# Patient Record
Sex: Female | Born: 1975 | Race: White | Hispanic: No | Smoking: Former smoker
Health system: Southern US, Community
[De-identification: ages and names within clinical notes are randomized; demographics above are authoritative.]

## PROBLEM LIST (undated history)

## (undated) DIAGNOSIS — R112 Nausea with vomiting, unspecified: Secondary | ICD-10-CM

## (undated) DIAGNOSIS — J390 Retropharyngeal and parapharyngeal abscess: Secondary | ICD-10-CM

## (undated) DIAGNOSIS — Z9889 Other specified postprocedural states: Secondary | ICD-10-CM

## (undated) DIAGNOSIS — F32A Depression, unspecified: Secondary | ICD-10-CM

## (undated) DIAGNOSIS — C801 Malignant (primary) neoplasm, unspecified: Secondary | ICD-10-CM

## (undated) DIAGNOSIS — F411 Generalized anxiety disorder: Secondary | ICD-10-CM

## (undated) DIAGNOSIS — E039 Hypothyroidism, unspecified: Secondary | ICD-10-CM

## (undated) HISTORY — PX: WISDOM TOOTH EXTRACTION: SHX21

## (undated) HISTORY — DX: Generalized anxiety disorder: F41.1

## (undated) HISTORY — PX: OTHER SURGICAL HISTORY: SHX169

## (undated) HISTORY — DX: Retropharyngeal and parapharyngeal abscess: J39.0

---
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
## 1997-08-20 HISTORY — PX: TONSILLECTOMY: SUR1361

## 1998-12-30 ENCOUNTER — Emergency Department (HOSPITAL_COMMUNITY): Admission: EM | Admit: 1998-12-30 | Discharge: 1998-12-30 | Payer: Self-pay | Admitting: Emergency Medicine

## 1999-02-07 ENCOUNTER — Inpatient Hospital Stay (HOSPITAL_COMMUNITY): Admission: AD | Admit: 1999-02-07 | Discharge: 1999-02-07 | Payer: Self-pay | Admitting: *Deleted

## 2005-06-12 ENCOUNTER — Other Ambulatory Visit: Admission: RE | Admit: 2005-06-12 | Discharge: 2005-06-12 | Payer: Self-pay | Admitting: Obstetrics and Gynecology

## 2009-07-21 ENCOUNTER — Encounter: Payer: Self-pay | Admitting: Family Medicine

## 2009-07-21 ENCOUNTER — Ambulatory Visit: Payer: Self-pay | Admitting: Family Medicine

## 2009-07-21 DIAGNOSIS — E0590 Thyrotoxicosis, unspecified without thyrotoxic crisis or storm: Secondary | ICD-10-CM | POA: Insufficient documentation

## 2009-07-22 LAB — CONVERTED CEMR LAB
Free Thyroxine Index: 3.7 (ref 1.0–3.9)
T3 Uptake Ratio: 30 % (ref 22.5–37.0)
T4, Total: 12.4 ug/dL (ref 5.0–12.5)
Thyroglobulin Ab: 316.3 — ABNORMAL HIGH (ref 0.0–60.0)

## 2009-08-01 ENCOUNTER — Encounter (HOSPITAL_COMMUNITY): Admission: RE | Admit: 2009-08-01 | Discharge: 2009-08-19 | Payer: Self-pay | Admitting: Family Medicine

## 2009-08-08 DIAGNOSIS — E0500 Thyrotoxicosis with diffuse goiter without thyrotoxic crisis or storm: Secondary | ICD-10-CM | POA: Insufficient documentation

## 2009-09-19 ENCOUNTER — Encounter: Payer: Self-pay | Admitting: Family Medicine

## 2009-10-18 ENCOUNTER — Ambulatory Visit: Payer: Self-pay | Admitting: Family Medicine

## 2009-10-18 ENCOUNTER — Encounter: Payer: Self-pay | Admitting: Family Medicine

## 2009-10-18 DIAGNOSIS — T887XXA Unspecified adverse effect of drug or medicament, initial encounter: Secondary | ICD-10-CM | POA: Insufficient documentation

## 2009-10-19 LAB — CONVERTED CEMR LAB
ALT: 13 units/L (ref 0–35)
Alkaline Phosphatase: 48 units/L (ref 39–117)
BUN: 15 mg/dL (ref 6–23)
Basophils Relative: 0 % (ref 0–1)
CO2: 27 meq/L (ref 19–32)
Calcium: 9.3 mg/dL (ref 8.4–10.5)
Chloride: 106 meq/L (ref 96–112)
Eosinophils Relative: 2 % (ref 0–5)
Glucose, Bld: 143 mg/dL — ABNORMAL HIGH (ref 70–99)
Monocytes Absolute: 0.5 10*3/uL (ref 0.1–1.0)
Monocytes Relative: 4 % (ref 3–12)
Neutrophils Relative %: 70 % (ref 43–77)
Platelets: 296 10*3/uL (ref 150–400)
RDW: 12.8 % (ref 11.5–15.5)
Total Bilirubin: 0.3 mg/dL (ref 0.3–1.2)

## 2009-10-28 ENCOUNTER — Encounter (HOSPITAL_COMMUNITY): Admission: RE | Admit: 2009-10-28 | Discharge: 2009-12-20 | Payer: Self-pay | Admitting: Internal Medicine

## 2010-06-16 ENCOUNTER — Encounter: Payer: Self-pay | Admitting: Family Medicine

## 2010-06-16 ENCOUNTER — Ambulatory Visit: Payer: Self-pay | Admitting: Family Medicine

## 2010-06-16 DIAGNOSIS — J019 Acute sinusitis, unspecified: Secondary | ICD-10-CM

## 2010-09-10 ENCOUNTER — Encounter: Payer: Self-pay | Admitting: Family Medicine

## 2010-09-19 NOTE — Assessment & Plan Note (Signed)
Summary: Sinusitis   Vital Signs:  Patient profile:   35 year old female Height:      65 inches Weight:      130 pounds Temp:     98.4 degrees F oral Pulse rate:   75 / minute BP sitting:   128 / 81  (right arm) Cuff size:   regular  Vitals Entered By: Avon Gully CMA, Duncan Dull) (June 16, 2010 1:19 PM) CC: chest congestion, productive cough, had a "head cold" for three weeks   Primary Care Provider:  Seymour Bars DO  CC:  chest congestion, productive cough, and had a "head cold" for three weeks.  History of Present Illness: chest congestion, productive cough, had a "head cold" for three weeks. Still has nasal congestion. + post nasa drip. Cough is dry and wet. No fever.  No ear pain or pressure.  No GI sxs. Intermittant HA behind her eye. CHest feels "goopy". No SOB.  Hx of asthma asa child. Using contact day cold med.    Current Medications (verified): 1)  Effexor .... As Directed By Psych 2)  Lorazepam 0.5 Mg Tabs (Lorazepam) .... Take 1-2 Tabs By Mouth As Needed 3)  Seasonique 0.15-0.03 &0.01 Mg Tabs (Levonorgest-Eth Estrad 91-Day) .... Take 1 Tab By Mouth Once Daily 4)  Synthroid 25 Mcg Tabs (Levothyroxine Sodium)  Allergies (verified): 1)  ! Methimazole (Methimazole)  Comments:  Nurse/Medical Assistant: The patient's medications and allergies were reviewed with the patient and were updated in the Medication and Allergy Lists. Avon Gully CMA, Duncan Dull) (June 16, 2010 1:21 PM)  Physical Exam  General:  Well-developed,well-nourished,in no acute distress; alert,appropriate and cooperative throughout examination Head:  Normocephalic and atraumatic without obvious abnormalities. No apparent alopecia or balding. Eyes:  No corneal or conjunctival inflammation noted. EOMI. Perrla.  Ears:  External ear exam shows no significant lesions or deformities.  Otoscopic examination reveals clear canals, tympanic membranes are intact bilaterally without bulging, retraction,  inflammation or discharge. Hearing is grossly normal bilaterally. Nose:  External nasal examination shows no deformity or inflammation. Nasal mucosa are pink and moist without lesions or exudates. Mouth:  Oral mucosa and oropharynx without lesions or exudates.  Teeth in good repair. Neck:  No deformities, masses, or tenderness noted. Lungs:  Normal respiratory effort, chest expands symmetrically. Lungs are clear to auscultation, no crackles or wheezes. Heart:  Normal rate and regular rhythm. S1 and S2 normal without gallop, murmur, click, rub or other extra sounds. Skin:  no rashes.   Cervical Nodes:  No lymphadenopathy noted Psych:  Cognition and judgment appear intact. Alert and cooperative with normal attention span and concentration. No apparent delusions, illusions, hallucinations   Impression & Recommendations:  Problem # 1:  SINUSITIS - ACUTE-NOS (ICD-461.9)  Her updated medication list for this problem includes:    Amoxicillin 875 Mg Tabs (Amoxicillin) .Marland Kitchen... Take 1 tablet by mouth two times a day for 10 days  Instructed on treatment. Call if symptoms persist or worsen.   Complete Medication List: 1)  Effexor  .... As directed by psych 2)  Lorazepam 0.5 Mg Tabs (Lorazepam) .... Take 1-2 tabs by mouth as needed 3)  Seasonique 0.15-0.03 &0.01 Mg Tabs (Levonorgest-eth estrad 91-day) .... Take 1 tab by mouth once daily 4)  Synthroid 25 Mcg Tabs (Levothyroxine sodium) 5)  Amoxicillin 875 Mg Tabs (Amoxicillin) .... Take 1 tablet by mouth two times a day for 10 days  Patient Instructions: 1)  Call if not better in one week.  Prescriptions: AMOXICILLIN 875  MG TABS (AMOXICILLIN) Take 1 tablet by mouth two times a day for 10 days  #20 x 0   Entered and Authorized by:   Nani Gasser MD   Signed by:   Nani Gasser MD on 06/16/2010   Method used:   Electronically to        [facility] [phone] (retail)       [address]       [place], [place]  [address]        Ph: [phone] or [phone]       Fax: [phone]   RxID:   [id number]    Orders Added: 1)  Est. Patient Level III [34742]

## 2010-09-19 NOTE — Consult Note (Signed)
Summary: [facility]   Imported By: Lanelle Bal 09/26/2009 11:29:09  _____________________________________________________________________  External Attachment:    Type:   Image     Comment:   External Document

## 2010-09-19 NOTE — Assessment & Plan Note (Signed)
Summary: drug rxn   Vital Signs:  Patient profile:   35 year old female Height:      65 inches Weight:      122 pounds BMI:     20.38 O2 Sat:      98 % on Room air Temp:     97.8 degrees F oral Pulse rate:   91 / minute BP sitting:   127 / 75  (left arm) Cuff size:   regular  Vitals Entered By: Payton Spark CMA (October 18, 2009 2:57 PM)  O2 Flow:  Room air CC: Had allergic reaction to Methimazole last week. Was told if itching and rash did not resolve in 7 days to see PCP.    Primary Care Provider:  Seymour Bars DO  CC:  Had allergic reaction to Methimazole last week. Was told if itching and rash did not resolve in 7 days to see PCP. Marland Kitchen  History of Present Illness: 35 yo WF presents for allergic reaction that started from the start of Methimazole (3 wks into new RX)  given by endocrinology.  The rash and itchiness started 10 days ago.  The Benadryl has helped her hives go away.  She is scrathing in her sleep.  Denies any new soaps or detergents.  Denies wheezing or throat edema.  Denies hx of eczema or hay fever.  Dr Sharl Ma has been seeing her for hyperthyroidism.  She is scheduled for radioactive iodine in March.    Current Medications (verified): 1)  Effexor .... As Directed By Psych 2)  Lorazepam 0.5 Mg Tabs (Lorazepam) .... Take 1-2 Tabs By Mouth As Needed 3)  Seasonique 0.15-0.03 &0.01 Mg Tabs (Levonorgest-Eth Estrad 91-Day) .... Take 1 Tab By Mouth Once Daily 4)  Atenolol 25 Mg Tabs (Atenolol) .Marland Kitchen.. 1 Tab By Mouth Daily  Allergies (verified): 1)  ! Methimazole (Methimazole)  Past History:  Past Medical History: Reviewed history from 07/21/2009 and no changes required. hyperthyroidism 06-2009 GAD - DR Aldean Ast - [facility] G4P0 - 4 eABs  Social History: Reviewed history from 07/21/2009 and no changes required. Banker for [employer]. Quit smoking in 2007. 1 ETOH/ wk. Walks 4 days/ wk.  Fair diet.   Married to [employer].  No kids.    Review of Systems      See  HPI  Physical Exam  General:  alert, well-developed, well-nourished, and well-hydrated.   Head:  normocephalic and atraumatic.   Eyes:  conjunctiva clear Nose:  no nasal discharge.   Mouth:  pharynx pink and moist.  no peeling of the mucous membraines.   Neck:  no masses.  no soft tissue edema Lungs:  Normal respiratory effort, chest expands symmetrically. Lungs are clear to auscultation, no crackles or wheezes. Heart:  Normal rate and regular rhythm. S1 and S2 normal without gallop, murmur, click, rub or other extra sounds. Skin:  no hives but has excoriations on both of her arms, legs, neck and trunk Cervical Nodes:  No lymphadenopathy noted   Impression & Recommendations:  Problem # 1:  ADVERSE DRUG REACTION (ICD-995.20) Pruritis and urticaria listed as drug SE from Methimazole which she stopped 10 days ago.  Valda Lamb has improved but she still has pruritis.  Check CMP and CBC today for prolonged symptoms and start on Prednisone 40 mg/ day for 5 days along with Hydroxyzine at bedtime.  Can use Sarna Lotion daily.  Call if not improved in a wk.   Orders: T-CBC w/Diff ([id number]) T-Comprehensive Metabolic Panel [phone])  Problem # 2:  HYPERTHYROIDISM (ICD-242.90) Has f/u with Dr Sharl Ma for radioactive iodine in 2 wks.  RFd her Atenolol today. Her updated medication list for this problem includes:    Atenolol 25 Mg Tabs (Atenolol) .Marland Kitchen... 1 tab by mouth daily  Complete Medication List: 1)  Effexor  .... As directed by psych 2)  Lorazepam 0.5 Mg Tabs (Lorazepam) .... Take 1-2 tabs by mouth as needed 3)  Seasonique 0.15-0.03 &0.01 Mg Tabs (Levonorgest-eth estrad 91-day) .... Take 1 tab by mouth once daily 4)  Atenolol 25 Mg Tabs (Atenolol) .Marland Kitchen.. 1 tab by mouth daily 5)  Prednisone 20 Mg Tabs (Prednisone) .... 2 tabs by mouth qam x 5 days 6)  Hydroxyzine Hcl 25 Mg Tabs (Hydroxyzine hcl) .Marland Kitchen.. 1-2 tabs by mouth at bedtime as needed itching  Patient Instructions: 1)  Labs  today. 2)  Will call you w/ results tomorrow. 3)  Start Hydroxyzine tonight and take at bedtime for itching. 4)  Take Prednisone 40 mg in the AM x 5 days for allergic reaction. 5)  Use Sarna anti itch cream after bathing. Prescriptions: HYDROXYZINE HCL 25 MG TABS (HYDROXYZINE HCL) 1-2 tabs by mouth at bedtime as needed itching  #12 x 0   Entered and Authorized by:   Seymour Bars DO   Signed by:   Seymour Bars DO on 10/18/2009   Method used:   Electronically to        [facility] (retail)       [address]       [place], [place]  [address]       Ph: [phone]       Fax: [phone]   RxID:   [id number] PREDNISONE 20 MG TABS (PREDNISONE) 2 tabs by mouth qAM x 5 days  #10 x 0   Entered and Authorized by:   Seymour Bars DO   Signed by:   Seymour Bars DO on 10/18/2009   Method used:   Electronically to        [facility] [place] (retail)       [address]       [place], [place]  [address]       Ph: [phone]       Fax: [phone]   RxID:   [id number] ATENOLOL 25 MG TABS (ATENOLOL) 1 tab by mouth daily  #30 x 2   Entered and Authorized by:   Seymour Bars DO   Signed by:   Seymour Bars DO on 10/18/2009   Method used:   Electronically to        [facility] (retail)       [address]       [place], [place]  [address]       Ph: [phone]       Fax: [phone]   RxID:   [id number]

## 2010-11-13 LAB — HCG, SERUM, QUALITATIVE: Preg, Serum: NEGATIVE

## 2011-09-21 LAB — STREP B DNA PROBE: GBS: NEGATIVE

## 2011-10-11 ENCOUNTER — Encounter (HOSPITAL_COMMUNITY): Payer: Self-pay | Admitting: Anesthesiology

## 2011-10-11 ENCOUNTER — Encounter (HOSPITAL_COMMUNITY)
Admission: AD | Disposition: A | Discharge status: Home / Self Care | Payer: Self-pay | Source: Ambulatory Visit | Attending: Obstetrics and Gynecology

## 2011-10-11 ENCOUNTER — Inpatient Hospital Stay (HOSPITAL_COMMUNITY)
Admission: AD | Admit: 2011-10-11 | Discharge: 2011-10-15 | DRG: 371 | Disposition: A | Discharge status: Home / Self Care | Payer: BC Managed Care – PPO | Source: Ambulatory Visit | Attending: Obstetrics and Gynecology | Admitting: Obstetrics and Gynecology

## 2011-10-11 ENCOUNTER — Encounter (HOSPITAL_COMMUNITY): Payer: Self-pay | Admitting: *Deleted

## 2011-10-11 ENCOUNTER — Inpatient Hospital Stay (HOSPITAL_COMMUNITY): Payer: BC Managed Care – PPO | Admitting: Anesthesiology

## 2011-10-11 ENCOUNTER — Other Ambulatory Visit: Payer: Self-pay | Admitting: Obstetrics and Gynecology

## 2011-10-11 DIAGNOSIS — T887XXA Unspecified adverse effect of drug or medicament, initial encounter: Secondary | ICD-10-CM

## 2011-10-11 DIAGNOSIS — O4100X0 Oligohydramnios, unspecified trimester, not applicable or unspecified: Secondary | ICD-10-CM | POA: Diagnosis present

## 2011-10-11 DIAGNOSIS — E0590 Thyrotoxicosis, unspecified without thyrotoxic crisis or storm: Secondary | ICD-10-CM

## 2011-10-11 DIAGNOSIS — E0500 Thyrotoxicosis with diffuse goiter without thyrotoxic crisis or storm: Secondary | ICD-10-CM

## 2011-10-11 DIAGNOSIS — E039 Hypothyroidism, unspecified: Secondary | ICD-10-CM | POA: Diagnosis present

## 2011-10-11 DIAGNOSIS — E079 Disorder of thyroid, unspecified: Secondary | ICD-10-CM | POA: Diagnosis present

## 2011-10-11 DIAGNOSIS — J019 Acute sinusitis, unspecified: Secondary | ICD-10-CM

## 2011-10-11 DIAGNOSIS — O99284 Endocrine, nutritional and metabolic diseases complicating childbirth: Secondary | ICD-10-CM | POA: Diagnosis present

## 2011-10-11 HISTORY — DX: Hypothyroidism, unspecified: E03.9

## 2011-10-11 LAB — GC/CHLAMYDIA PROBE AMP, GENITAL: Chlamydia: NEGATIVE

## 2011-10-11 LAB — RPR: RPR: NONREACTIVE

## 2011-10-11 LAB — ABO/RH: RH Type: POSITIVE

## 2011-10-11 LAB — CBC
HCT: 38.1 % (ref 36.0–46.0)
MCHC: 34.4 g/dL (ref 30.0–36.0)
Platelets: 208 10*3/uL (ref 150–400)
RDW: 13 % (ref 11.5–15.5)
WBC: 11.5 10*3/uL — ABNORMAL HIGH (ref 4.0–10.5)

## 2011-10-11 LAB — HEPATITIS B SURFACE ANTIGEN: Hepatitis B Surface Ag: NEGATIVE

## 2011-10-11 SURGERY — Surgical Case
Anesthesia: Epidural | Site: Abdomen | Wound class: Clean Contaminated

## 2011-10-11 MED ORDER — SERTRALINE HCL 50 MG PO TABS
50.0000 mg | ORAL_TABLET | Freq: Every day | ORAL | Status: DC
  Administered 2011-10-11: 50 mg via ORAL
  Filled 2011-10-11 (×2): qty 1

## 2011-10-11 MED ORDER — ZOLPIDEM TARTRATE 10 MG PO TABS
10.0000 mg | ORAL_TABLET | Freq: Every evening | ORAL | Status: DC | PRN
Start: 2011-10-11 — End: 2011-10-11

## 2011-10-11 MED ORDER — EPHEDRINE 5 MG/ML INJ: 10.0000 mg | INTRAVENOUS | Status: DC | PRN

## 2011-10-11 MED ORDER — LACTATED RINGERS IV SOLN
500.0000 mL | INTRAVENOUS | Status: DC | PRN
  Administered 2011-10-11: 300 mL via INTRAVENOUS
  Administered 2011-10-11: 500 mL via INTRAVENOUS

## 2011-10-11 MED ORDER — IBUPROFEN 600 MG PO TABS: 600.0000 mg | ORAL_TABLET | Freq: Four times a day (QID) | ORAL | Status: DC | PRN

## 2011-10-11 MED ORDER — EPHEDRINE 5 MG/ML INJ
INTRAVENOUS | Status: AC
  Filled 2011-10-11: qty 10

## 2011-10-11 MED ORDER — LACTATED RINGERS IV SOLN
500.0000 mL | Freq: Once | INTRAVENOUS | Status: AC
  Administered 2011-10-11: 500 mL via INTRAVENOUS

## 2011-10-11 MED ORDER — NALBUPHINE SYRINGE 5 MG/0.5 ML
5.0000 mg | INJECTION | INTRAMUSCULAR | Status: DC | PRN
  Filled 2011-10-11: qty 1

## 2011-10-11 MED ORDER — SODIUM BICARBONATE 8.4 % IV SOLN
INTRAVENOUS | Status: AC
  Filled 2011-10-11: qty 50

## 2011-10-11 MED ORDER — SODIUM BICARBONATE 8.4 % IV SOLN
INTRAVENOUS | Status: DC | PRN
  Administered 2011-10-11: 5 mL via EPIDURAL

## 2011-10-11 MED ORDER — MEPERIDINE HCL 25 MG/ML IJ SOLN: 6.2500 mg | INTRAMUSCULAR | Status: DC | PRN

## 2011-10-11 MED ORDER — DIPHENHYDRAMINE HCL 50 MG/ML IJ SOLN: 12.5000 mg | INTRAMUSCULAR | Status: DC | PRN

## 2011-10-11 MED ORDER — MORPHINE SULFATE (PF) 0.5 MG/ML IJ SOLN
INTRAMUSCULAR | Status: DC | PRN
  Administered 2011-10-11: 1 mg via EPIDURAL

## 2011-10-11 MED ORDER — KETOROLAC TROMETHAMINE 30 MG/ML IJ SOLN
30.0000 mg | Freq: Four times a day (QID) | INTRAMUSCULAR | Status: DC | PRN
  Administered 2011-10-11: 30 mg via INTRAVENOUS

## 2011-10-11 MED ORDER — NALOXONE HCL 0.4 MG/ML IJ SOLN: 0.4000 mg | INTRAMUSCULAR | Status: DC | PRN

## 2011-10-11 MED ORDER — OXYTOCIN BOLUS FROM INFUSION
500.0000 mL | Freq: Once | INTRAVENOUS | Status: DC
  Filled 2011-10-11: qty 500

## 2011-10-11 MED ORDER — TERBUTALINE SULFATE 1 MG/ML IJ SOLN: 0.2500 mg | Freq: Once | INTRAMUSCULAR | Status: DC | PRN

## 2011-10-11 MED ORDER — LIDOCAINE HCL (PF) 1 % IJ SOLN: 30.0000 mL | INTRAMUSCULAR | Status: DC | PRN

## 2011-10-11 MED ORDER — CEFAZOLIN SODIUM 1-5 GM-% IV SOLN
1.0000 g | Freq: Three times a day (TID) | INTRAVENOUS | Status: DC
  Administered 2011-10-11: 1 g via INTRAVENOUS

## 2011-10-11 MED ORDER — PHENYLEPHRINE 40 MCG/ML (10ML) SYRINGE FOR IV PUSH (FOR BLOOD PRESSURE SUPPORT): 80.0000 ug | PREFILLED_SYRINGE | INTRAVENOUS | Status: DC | PRN

## 2011-10-11 MED ORDER — OXYTOCIN 10 UNIT/ML IJ SOLN
INTRAMUSCULAR | Status: AC
  Filled 2011-10-11: qty 4

## 2011-10-11 MED ORDER — PROMETHAZINE HCL 25 MG/ML IJ SOLN: 6.2500 mg | INTRAMUSCULAR | Status: DC | PRN

## 2011-10-11 MED ORDER — OXYTOCIN 20 UNITS IN LACTATED RINGERS INFUSION - SIMPLE
1.0000 m[IU]/min | INTRAVENOUS | Status: DC
Start: 2011-10-11 — End: 2011-10-11

## 2011-10-11 MED ORDER — LIDOCAINE-EPINEPHRINE (PF) 2 %-1:200000 IJ SOLN
INTRAMUSCULAR | Status: AC
  Filled 2011-10-11: qty 20

## 2011-10-11 MED ORDER — FENTANYL 2.5 MCG/ML BUPIVACAINE 1/10 % EPIDURAL INFUSION (WH - ANES): 14.0000 mL/h | INTRAMUSCULAR | Status: DC

## 2011-10-11 MED ORDER — ONDANSETRON HCL 4 MG/2ML IJ SOLN: 4.0000 mg | Freq: Four times a day (QID) | INTRAMUSCULAR | Status: DC | PRN

## 2011-10-11 MED ORDER — LACTATED RINGERS IV SOLN
INTRAVENOUS | Status: DC | PRN
  Administered 2011-10-11 (×2): via INTRAVENOUS

## 2011-10-11 MED ORDER — LIDOCAINE HCL (PF) 1 % IJ SOLN
INTRAMUSCULAR | Status: DC | PRN
  Administered 2011-10-11: 5 mL

## 2011-10-11 MED ORDER — PHENYLEPHRINE 40 MCG/ML (10ML) SYRINGE FOR IV PUSH (FOR BLOOD PRESSURE SUPPORT)
PREFILLED_SYRINGE | INTRAVENOUS | Status: AC
  Filled 2011-10-11: qty 5

## 2011-10-11 MED ORDER — ONDANSETRON HCL 4 MG/2ML IJ SOLN
INTRAMUSCULAR | Status: DC | PRN
  Administered 2011-10-11: 4 mg via INTRAVENOUS

## 2011-10-11 MED ORDER — FENTANYL 2.5 MCG/ML BUPIVACAINE 1/10 % EPIDURAL INFUSION (WH - ANES)
INTRAMUSCULAR | Status: AC
  Filled 2011-10-11: qty 60

## 2011-10-11 MED ORDER — FENTANYL CITRATE 0.05 MG/ML IJ SOLN: 25.0000 ug | INTRAMUSCULAR | Status: DC | PRN

## 2011-10-11 MED ORDER — SCOPOLAMINE 1 MG/3DAYS TD PT72: 1.0000 | MEDICATED_PATCH | Freq: Once | TRANSDERMAL | Status: DC

## 2011-10-11 MED ORDER — KETOROLAC TROMETHAMINE 30 MG/ML IJ SOLN: 30.0000 mg | Freq: Four times a day (QID) | INTRAMUSCULAR | Status: DC | PRN

## 2011-10-11 MED ORDER — METOCLOPRAMIDE HCL 5 MG/ML IJ SOLN
10.0000 mg | Freq: Three times a day (TID) | INTRAMUSCULAR | Status: DC | PRN
Start: 2011-10-11 — End: 2011-10-15

## 2011-10-11 MED ORDER — EPHEDRINE SULFATE 50 MG/ML IJ SOLN
INTRAMUSCULAR | Status: DC | PRN
  Administered 2011-10-11: 10 mg via INTRAVENOUS

## 2011-10-11 MED ORDER — ACETAMINOPHEN 10 MG/ML IV SOLN
1000.0000 mg | Freq: Four times a day (QID) | INTRAVENOUS | Status: AC | PRN
  Filled 2011-10-11: qty 100

## 2011-10-11 MED ORDER — LACTATED RINGERS IV SOLN
INTRAVENOUS | Status: DC
  Administered 2011-10-11: 20:00:00 via INTRAVENOUS

## 2011-10-11 MED ORDER — OXYTOCIN 20 UNITS IN LACTATED RINGERS INFUSION - SIMPLE: 125.0000 mL/h | Freq: Once | INTRAVENOUS | Status: DC

## 2011-10-11 MED ORDER — CITRIC ACID-SODIUM CITRATE 334-500 MG/5ML PO SOLN
30.0000 mL | ORAL | Status: DC | PRN
  Administered 2011-10-11: 30 mL via ORAL
  Filled 2011-10-11: qty 15

## 2011-10-11 MED ORDER — MISOPROSTOL 25 MCG QUARTER TABLET
25.0000 ug | ORAL_TABLET | ORAL | Status: DC | PRN
  Administered 2011-10-11: 25 ug via VAGINAL
  Filled 2011-10-11: qty 0.25

## 2011-10-11 MED ORDER — SODIUM CHLORIDE 0.9 % IV SOLN
1.0000 ug/kg/h | INTRAVENOUS | Status: DC | PRN
  Filled 2011-10-11: qty 2.5

## 2011-10-11 MED ORDER — OXYTOCIN 20 UNITS IN LACTATED RINGERS INFUSION - SIMPLE
INTRAVENOUS | Status: DC | PRN
  Administered 2011-10-11 (×2): 20 [IU] via INTRAVENOUS

## 2011-10-11 MED ORDER — EPHEDRINE 5 MG/ML INJ
INTRAVENOUS | Status: AC
  Filled 2011-10-11: qty 4

## 2011-10-11 MED ORDER — MORPHINE SULFATE (PF) 0.5 MG/ML IJ SOLN
INTRAMUSCULAR | Status: DC | PRN
  Administered 2011-10-11: 4 mg via EPIDURAL

## 2011-10-11 MED ORDER — ACETAMINOPHEN 325 MG PO TABS: 650.0000 mg | ORAL_TABLET | ORAL | Status: DC | PRN

## 2011-10-11 MED ORDER — SODIUM CHLORIDE 0.9 % IJ SOLN: 3.0000 mL | INTRAMUSCULAR | Status: DC | PRN

## 2011-10-11 MED ORDER — MORPHINE SULFATE 0.5 MG/ML IJ SOLN
INTRAMUSCULAR | Status: AC
  Filled 2011-10-11: qty 10

## 2011-10-11 MED ORDER — ONDANSETRON HCL 4 MG/2ML IJ SOLN: 4.0000 mg | Freq: Three times a day (TID) | INTRAMUSCULAR | Status: DC | PRN

## 2011-10-11 MED ORDER — DIPHENHYDRAMINE HCL 50 MG/ML IJ SOLN: 25.0000 mg | INTRAMUSCULAR | Status: DC | PRN

## 2011-10-11 MED ORDER — DIPHENHYDRAMINE HCL 25 MG PO CAPS: 25.0000 mg | ORAL_CAPSULE | ORAL | Status: DC | PRN

## 2011-10-11 MED ORDER — KETOROLAC TROMETHAMINE 30 MG/ML IJ SOLN
INTRAMUSCULAR | Status: AC
  Filled 2011-10-11: qty 1

## 2011-10-11 MED ORDER — CEFAZOLIN SODIUM 1-5 GM-% IV SOLN
INTRAVENOUS | Status: AC
  Filled 2011-10-11: qty 50

## 2011-10-11 MED ORDER — ACETAMINOPHEN 325 MG PO TABS: 325.0000 mg | ORAL_TABLET | ORAL | Status: DC | PRN

## 2011-10-11 MED ORDER — OXYCODONE-ACETAMINOPHEN 5-325 MG PO TABS: 1.0000 | ORAL_TABLET | ORAL | Status: DC | PRN

## 2011-10-11 MED ORDER — FLEET ENEMA 7-19 GM/118ML RE ENEM: 1.0000 | ENEMA | RECTAL | Status: DC | PRN

## 2011-10-11 MED ORDER — ONDANSETRON HCL 4 MG/2ML IJ SOLN
INTRAMUSCULAR | Status: AC
  Filled 2011-10-11: qty 2

## 2011-10-11 SURGICAL SUPPLY — 25 items
CLOTH BEACON ORANGE TIMEOUT ST (SAFETY) ×2 IMPLANT
DRESSING TELFA 8X3 (GAUZE/BANDAGES/DRESSINGS) IMPLANT
DRSG COVADERM 4X6 (GAUZE/BANDAGES/DRESSINGS) ×1 IMPLANT
ELECT REM PT RETURN 9FT ADLT (ELECTROSURGICAL) ×2
ELECTRODE REM PT RTRN 9FT ADLT (ELECTROSURGICAL) ×1 IMPLANT
EXTRACTOR VACUUM M CUP 4 TUBE (SUCTIONS) IMPLANT
GAUZE SPONGE 4X4 12PLY STRL LF (GAUZE/BANDAGES/DRESSINGS) ×2 IMPLANT
GLOVE BIO SURGEON STRL SZ7 (GLOVE) ×4 IMPLANT
GOWN PREVENTION PLUS LG XLONG (DISPOSABLE) ×6 IMPLANT
KIT ABG SYR 3ML LUER SLIP (SYRINGE) IMPLANT
NDL HYPO 25X5/8 SAFETYGLIDE (NEEDLE) ×1 IMPLANT
NEEDLE HYPO 25X5/8 SAFETYGLIDE (NEEDLE) ×2 IMPLANT
NS IRRIG 1000ML POUR BTL (IV SOLUTION) ×2 IMPLANT
PACK C SECTION WH (CUSTOM PROCEDURE TRAY) ×2 IMPLANT
PAD ABD 7.5X8 STRL (GAUZE/BANDAGES/DRESSINGS) IMPLANT
SLEEVE SCD COMPRESS KNEE MED (MISCELLANEOUS) ×1 IMPLANT
STRIP CLOSURE SKIN 1/2X4 (GAUZE/BANDAGES/DRESSINGS) ×2 IMPLANT
SUT CHROMIC 0 CTX 36 (SUTURE) ×6 IMPLANT
SUT MON AB 4-0 PS1 27 (SUTURE) ×2 IMPLANT
SUT PDS AB 0 CT1 27 (SUTURE) ×4 IMPLANT
SUT VIC AB 3-0 CT1 27 (SUTURE) ×4
SUT VIC AB 3-0 CT1 TAPERPNT 27 (SUTURE) ×2 IMPLANT
TOWEL OR 17X24 6PK STRL BLUE (TOWEL DISPOSABLE) ×4 IMPLANT
TRAY FOLEY CATH 14FR (SET/KITS/TRAYS/PACK) ×2 IMPLANT
WATER STERILE IRR 1000ML POUR (IV SOLUTION) ×1 IMPLANT

## 2011-10-11 NOTE — Progress Notes (Signed)
Patient ID: Alicia Roach, female   DOB: 1976-04-10, 36 y.o.   MRN: [id number] CTSP after first cytotec dose>>>recurrent variable and var-late decels.  Discussed CS w/ pt, epidural placed, procedure reviewed

## 2011-10-11 NOTE — Transfer of Care (Signed)
Immediate Anesthesia Transfer of Care Note  Patient: Alicia Roach  Procedure(s) Performed: Procedure(s) (LRB): CESAREAN SECTION (N/A)  Patient Location: PACU  Anesthesia Type: Epidural  Level of Consciousness: awake, alert , oriented and patient cooperative  Airway & Oxygen Therapy: Patient Spontanous Breathing  Post-op Assessment: Report given to PACU RN and Post -op Vital signs reviewed and stable  Post vital signs: Reviewed and stable  Complications: No apparent anesthesia complications

## 2011-10-11 NOTE — Brief Op Note (Signed)
10/11/2011  11:09 PM  PATIENT:  Alicia Roach  36 y.o. female  PRE-OPERATIVE DIAGNOSIS:  Fetal Intolerance to Labor, Oligohydramnios  POST-OPERATIVE DIAGNOSIS:  Fetal Intolerance to LaborOligohydramnios  PROCEDURE:  Procedure(s) (LRB): CESAREAN SECTION (N/A)  SURGEON:  Surgeon(s) and Role:    * Meriel Pica, MD - Primary  PHYSICIAN ASSISTANT:   ASSISTANTS: none   ANESTHESIA:   epidural  EBL:  Total I/O In: 1000 [I.V.:1000] Out: 800 [Urine:200; Blood:600]  BLOOD ADMINISTERED:none  DRAINS: Urinary Catheter (Foley)   LOCAL MEDICATIONS USED:  NONE  SPECIMEN:  Source of Specimen:  placenta  DISPOSITION OF SPECIMEN:  PATHOLOGY  COUNTS:  YES  TOURNIQUET:  * No tourniquets in log *  DICTATION: .Dragon Dictation  PLAN OF CARE: Admit to inpatient   PATIENT DISPOSITION:  PACU - hemodynamically stable.   Delay start of Pharmacological VTE agent (>24hrs) due to surgical blood loss or risk of bleeding: not applicable  Preoperative diagnosis: 38.5 week IUP, oligohydramnios, fetal intolerance to labor  Postoperative diagnosis: Same  Procedure: Primary low transverse cesarean section  Anesthesia: Epidural  Surgeon Alicia Roach  EBL: 800 cc  Procedure and findings:  The patient was taken to the operating room after an adequate level of epidural anesthesia was obtained with the patient in left tilt position the abdomen prepped and draped in the usual fashion for cesarean section Foley catheter positioned draining clear urine transverse Pfannenstiel incision was made 2 finger restaurant symphysis carried down to the fascia which was incised and extended transversely. Rectus muscle divided in the midline peritoneum entered superiorly without incident and extended in a vertical fashion. The vesicouterine serosa was incised and the bladder was bluntly and sharply dissected below. Bladder blade repositioned. Transverse incision made in the lower segment extended with bandage  scissors the fetus was presenting in the vertex minimal fluid moderate meconium noted loose nuchal cord x1. The infant was suctioned cord clamped and passed the pediatric team for further care. Cord pH 7.22 placenta was then delivered manually intact and sent to pathology, uterus exteriorized cavity wiped clean with laparotomy pack closure obtained with the first layer with chromic in a locked fashion followed by an imbricating layer of 0 chromic this was hemostatic tubes and ovaries were normal bilaterally the bladder flap area was intact and hemostatic. Prior to closure sponge denies precast reported as correct x2 peritoneum closed the running 2-0 Vicryl suture. 2-0 Vicryl interrupted sutures used to reapproximate the rectus muscles in the midline a 0 PDS suture was then used to close the fascia from laterally to midline on either side. Subcutaneous tissue was minimal and hemostatic 4-0 Monocryl subcontractor suture with a sterile dressing applied she did receive Ancef 1 g IV preop and Pitocin IV after the cord clamp mother and baby doing well at that point, baby stable to MICU for observation.  Dictated with dragon medical Karol Skarzynski M. Milana Obey.D.

## 2011-10-11 NOTE — Anesthesia Procedure Notes (Signed)
Epidural Patient location during procedure: OB Start time: 10/11/2011 9:56 PM  Staffing Anesthesiologist: Brayton Caves R Performed by: anesthesiologist   Preanesthetic Checklist Completed: patient identified, site marked, surgical consent, pre-op evaluation, timeout performed, IV checked, risks and benefits discussed and monitors and equipment checked  Epidural Patient position: sitting Prep: site prepped and draped and DuraPrep Patient monitoring: continuous pulse ox and blood pressure Approach: midline Injection technique: LOR air and LOR saline  Needle:  Needle type: Tuohy  Needle gauge: 17 G Needle length: 9 cm Needle insertion depth: 5 cm cm Catheter type: closed end flexible Catheter size: 19 Gauge Catheter at skin depth: 10 cm Test dose: negative  Assessment Events: blood not aspirated, injection not painful, no injection resistance, negative IV test and no paresthesia  Additional Notes Patient identified.  Risk benefits discussed including failed block, incomplete pain control, headache, nerve damage, paralysis, blood pressure changes, nausea, vomiting, reactions to medication both toxic or allergic, and postpartum back pain.  Patient expressed understanding and wished to proceed.  All questions were answered.  Sterile technique used throughout procedure and epidural site dressed with sterile barrier dressing. No paresthesia or other complications noted.The patient did not experience any signs of intravascular injection such as tinnitus or metallic taste in mouth nor signs of intrathecal spread such as rapid motor block. Please see nursing notes for vital signs.

## 2011-10-11 NOTE — Consult Note (Signed)
Requested to attend primary C/S at 38 weeks 3 days secondary to oligohydramnios and fetal intolerance to labor.  The oligo has developed over the past several weeks and had been seen in office today.  There were otherwise no problems reported during pregnancy. Mother has hypothyroidism and is on replacement thyroid and also on Zoloft.  Until several weeks ago fetus was in breech position but was vertex prior to C/S tonight.  At delivery infant in vertex and was manually extracted with decreased tone and poor color, covered in terminal meconium stool.  Abdomen scaphoid but chest wall symmetric. Responded minimally to vigorous tactile stimulation with drying and to bulb suction of naso/oropharynx yielding minimal clear fluid. Heart rate less than 100 despite above and color poor; infant begun on bag/mask IPPV at close to 2 minutes of age and intermittently so through 4 minutes of life, then switched to BBO2 for another several minutes. Heart rate rapidly improved and tone followed but color was more slow to recover surprisingly.  Apgar scores 4/7/8 at 08/24/08 minutes, respectively.   Infant clearly growth restricted with minimal subcu tissue and no Wharton's Jelly in cord.  Testes are descended bilaterally and palate is intact. He has a good suck.    Discussed with parents with decision to transfer to NICU for observation and for expectation of birth weight less than 2000 gm, requiring temperature support for an extended time period.  Infant shown to parents prior to placement into transport isolette.    Transfer to NICU was uneventful and infant continued to improve in central color and alert state.  Father accompanied infant to NICU.  Pediatric care will be with [facility].   I have personally assessed this infant and have been physically present and directed the development and the implementation of the collaborative plan of care as reflected in the daily progress and/or procedure notes composed by  the C-NNP    Alphonsa Brickle. Alphonsa Gin MD Attending Neonatologist

## 2011-10-11 NOTE — Progress Notes (Signed)
Dr Marcelle Overlie notified pt here for IOL secondary no amniotic fluid, G5P0, 38.3 weeks, SVE closed. Pt wants to continue taking Zoloft. Will put in orders, most likely 2 stage.

## 2011-10-11 NOTE — Anesthesia Preprocedure Evaluation (Signed)
Anesthesia Evaluation  Patient identified by MRN, date of birth, ID band Patient awake    Reviewed: Allergy & Precautions, H&P , Patient's Chart, lab work & pertinent test results  Airway Mallampati: II TM Distance: >3 FB Neck ROM: full    Dental No notable dental hx.    Pulmonary neg pulmonary ROS,  clear to auscultation  Pulmonary exam normal       Cardiovascular neg cardio ROS regular Normal    Neuro/Psych Negative Neurological ROS  Negative Psych ROS   GI/Hepatic negative GI ROS, Neg liver ROS,   Endo/Other  Negative Endocrine ROSHyperthyroidism   Renal/GU negative Renal ROS     Musculoskeletal   Abdominal   Peds  Hematology negative hematology ROS (+)   Anesthesia Other Findings   Reproductive/Obstetrics (+) Pregnancy                           Anesthesia Physical Anesthesia Plan  ASA: II  Anesthesia Plan: Epidural   Post-op Pain Management:    Induction:   Airway Management Planned:   Additional Equipment:   Intra-op Plan:   Post-operative Plan:   Informed Consent: I have reviewed the patients History and Physical, chart, labs and discussed the procedure including the risks, benefits and alternatives for the proposed anesthesia with the patient or authorized representative who has indicated his/her understanding and acceptance.     Plan Discussed with:   Anesthesia Plan Comments:         Anesthesia Quick Evaluation  

## 2011-10-11 NOTE — H&P (Signed)
Alicia Roach is a 36 y.o. female presenting for labor induction secondary to low AFI at terrm.  Seen in office by Dr Henderson Cloud today with low AFI and BPP6/8, adm for labor induction.  Currently on PNV and Zoloft Maternal Medical History:  Fetal activity: Perceived fetal activity is normal.    Prenatal complications: Oligohydramnios.     OB History    Grav Para Term Preterm Abortions TAB SAB Ect Mult Living   5 0 0 0 4 4 0 0 0 0      Past Medical History  Diagnosis Date  . Hypothyroidism    Past Surgical History  Procedure Date  . Digital reconstructive surgery (middle finger of l hand)   . Tonsillectomy   . Wisdom tooth extraction    Family History: family history is not on file. Social History:  reports that she has never smoked. She has never used smokeless tobacco. She reports that she does not drink alcohol or use illicit drugs.  ROS    Blood pressure 125/71, pulse 76, temperature 98.1 F (36.7 C), temperature source Oral, resp. rate 15, height 5\' 6"  (1.676 m), weight 156 lb (70.761 kg). Maternal Exam:  Abdomen: Patient reports no abdominal tenderness. Fundal height is 32cm.   Fetal presentation: vertex     Physical Exam  Constitutional: She is oriented to person, place, and time. She appears well-developed and well-nourished.  HENT:  Head: Normocephalic and atraumatic.  Neck: Normal range of motion. Neck supple.  Cardiovascular: Normal rate and regular rhythm.   Respiratory: Effort normal and breath sounds normal.  GI: Bowel sounds are normal.  Genitourinary:       cx cl/ soft/ vtx  Musculoskeletal: Normal range of motion.  Neurological: She is alert and oriented to person, place, and time.    Prenatal labs: ABO, Rh: A/Positive/-- (02/21 0000) Antibody: Negative (02/21 0000) Rubella: Immune (02/21 0000) RPR: Nonreactive (02/21 0000)  HBsAg: Negative (02/21 0000)  HIV: Non-reactive (02/21 0000)  GBS: Negative (02/01 0000)   Assessment/Plan: 38.5 week  IUP with low AFI at term, for labor induction. Procedure for cytotec and pitocin induction reviewed.  Meriel Pica 10/11/2011, 6:24 PM

## 2011-10-12 ENCOUNTER — Encounter (HOSPITAL_COMMUNITY): Payer: Self-pay | Admitting: Obstetrics and Gynecology

## 2011-10-12 LAB — CBC
Hemoglobin: 10.8 g/dL — ABNORMAL LOW (ref 12.0–15.0)
MCH: 31.7 pg (ref 26.0–34.0)
MCV: 92.7 fL (ref 78.0–100.0)
RBC: 3.41 MIL/uL — ABNORMAL LOW (ref 3.87–5.11)

## 2011-10-12 MED ORDER — FLEET ENEMA 7-19 GM/118ML RE ENEM: 1.0000 | ENEMA | Freq: Every day | RECTAL | Status: DC | PRN

## 2011-10-12 MED ORDER — OXYCODONE-ACETAMINOPHEN 5-325 MG PO TABS
1.0000 | ORAL_TABLET | Freq: Four times a day (QID) | ORAL | Status: DC | PRN
  Administered 2011-10-14 – 2011-10-15 (×3): 1 via ORAL
  Filled 2011-10-12 (×3): qty 1

## 2011-10-12 MED ORDER — LANOLIN HYDROUS EX OINT: 1.0000 "application " | TOPICAL_OINTMENT | CUTANEOUS | Status: DC | PRN

## 2011-10-12 MED ORDER — LEVOTHYROXINE SODIUM 112 MCG PO TABS
112.0000 ug | ORAL_TABLET | Freq: Every day | ORAL | Status: DC
  Administered 2011-10-12 – 2011-10-15 (×4): 112 ug via ORAL
  Filled 2011-10-12 (×5): qty 1

## 2011-10-12 MED ORDER — ONDANSETRON HCL 4 MG/2ML IJ SOLN: 4.0000 mg | INTRAMUSCULAR | Status: DC | PRN

## 2011-10-12 MED ORDER — SENNOSIDES-DOCUSATE SODIUM 8.6-50 MG PO TABS
2.0000 | ORAL_TABLET | Freq: Every day | ORAL | Status: DC
  Administered 2011-10-12 – 2011-10-14 (×3): 2 via ORAL

## 2011-10-12 MED ORDER — ZOLPIDEM TARTRATE 5 MG PO TABS: 5.0000 mg | ORAL_TABLET | Freq: Every evening | ORAL | Status: DC | PRN

## 2011-10-12 MED ORDER — SODIUM CHLORIDE 0.9 % IV SOLN: 250.0000 mL | INTRAVENOUS | Status: DC

## 2011-10-12 MED ORDER — IBUPROFEN 800 MG PO TABS
800.0000 mg | ORAL_TABLET | Freq: Three times a day (TID) | ORAL | Status: DC | PRN
  Administered 2011-10-12 – 2011-10-15 (×7): 800 mg via ORAL
  Filled 2011-10-12 (×7): qty 1

## 2011-10-12 MED ORDER — OXYTOCIN 20 UNITS IN LACTATED RINGERS INFUSION - SIMPLE: 125.0000 mL/h | INTRAVENOUS | Status: AC

## 2011-10-12 MED ORDER — ONDANSETRON HCL 4 MG PO TABS: 4.0000 mg | ORAL_TABLET | ORAL | Status: DC | PRN

## 2011-10-12 MED ORDER — DIBUCAINE 1 % RE OINT: 1.0000 "application " | TOPICAL_OINTMENT | RECTAL | Status: DC | PRN

## 2011-10-12 MED ORDER — DIPHENHYDRAMINE HCL 25 MG PO CAPS: 25.0000 mg | ORAL_CAPSULE | Freq: Four times a day (QID) | ORAL | Status: DC | PRN

## 2011-10-12 MED ORDER — MEASLES, MUMPS & RUBELLA VAC ~~LOC~~ INJ
0.5000 mL | INJECTION | Freq: Once | SUBCUTANEOUS | Status: DC
  Filled 2011-10-12: qty 0.5

## 2011-10-12 MED ORDER — TETANUS-DIPHTH-ACELL PERTUSSIS 5-2.5-18.5 LF-MCG/0.5 IM SUSP
0.5000 mL | Freq: Once | INTRAMUSCULAR | Status: DC
  Filled 2011-10-12: qty 0.5

## 2011-10-12 MED ORDER — SERTRALINE HCL 50 MG PO TABS
50.0000 mg | ORAL_TABLET | Freq: Every day | ORAL | Status: DC
  Administered 2011-10-12 – 2011-10-15 (×4): 50 mg via ORAL
  Filled 2011-10-12 (×5): qty 1

## 2011-10-12 MED ORDER — WITCH HAZEL-GLYCERIN EX PADS: 1.0000 "application " | MEDICATED_PAD | CUTANEOUS | Status: DC | PRN

## 2011-10-12 MED ORDER — PRENATAL MULTIVITAMIN CH
1.0000 | ORAL_TABLET | Freq: Every day | ORAL | Status: DC
  Administered 2011-10-12 – 2011-10-15 (×4): 1 via ORAL
  Filled 2011-10-12 (×4): qty 1

## 2011-10-12 MED ORDER — SIMETHICONE 80 MG PO CHEW
80.0000 mg | CHEWABLE_TABLET | Freq: Three times a day (TID) | ORAL | Status: DC
  Administered 2011-10-12 – 2011-10-15 (×10): 80 mg via ORAL

## 2011-10-12 MED ORDER — SODIUM CHLORIDE 0.9 % IJ SOLN: 3.0000 mL | INTRAMUSCULAR | Status: DC | PRN

## 2011-10-12 MED ORDER — BISACODYL 10 MG RE SUPP: 10.0000 mg | Freq: Every day | RECTAL | Status: DC | PRN

## 2011-10-12 MED ORDER — SODIUM CHLORIDE 0.9 % IJ SOLN
3.0000 mL | Freq: Two times a day (BID) | INTRAMUSCULAR | Status: DC
  Administered 2011-10-12: 3 mL via INTRAVENOUS

## 2011-10-12 MED ORDER — SIMETHICONE 80 MG PO CHEW: 80.0000 mg | CHEWABLE_TABLET | ORAL | Status: DC | PRN

## 2011-10-12 MED ORDER — MENTHOL 3 MG MT LOZG: 1.0000 | LOZENGE | OROMUCOSAL | Status: DC | PRN

## 2011-10-12 NOTE — Anesthesia Postprocedure Evaluation (Signed)
Anesthesia Post Note  Patient: Alicia Roach  Procedure(s) Performed: Procedure(s) (LRB): CESAREAN SECTION (N/A)  Anesthesia type: Epidural  Patient location: PACU  Post pain: Pain level controlled  Post assessment: Post-op Vital signs reviewed  Last Vitals:  Filed Vitals:   10/11/11 2345  BP: 129/77  Pulse: 71  Temp: 36.8 C  Resp: 10    Post vital signs: Reviewed  Level of consciousness: awake  Complications: No apparent anesthesia complications
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
## 2011-10-12 NOTE — Anesthesia Postprocedure Evaluation (Signed)
  Anesthesia Post-op Note  Patient: Alicia Roach  Procedure(s) Performed: Procedure(s) (LRB): CESAREAN SECTION (N/A)  Patient Location: 303  Anesthesia Type: Epidural  Level of Consciousness: awake, alert  and oriented  Airway and Oxygen Therapy: Patient Spontanous Breathing  Post-op Pain: none  Post-op Assessment: Post-op Vital signs reviewed, Patient's Cardiovascular Status Stable, No headache, No backache, No residual numbness and No residual motor weakness  Post-op Vital Signs: Reviewed and stable  Complications: No apparent anesthesia complications

## 2011-10-12 NOTE — Progress Notes (Signed)
UR Chart review completed.  

## 2011-10-12 NOTE — Addendum Note (Signed)
Addendum  created 10/12/11 1322 by Karleen Dolphin, CRNA   Modules edited:Notes Section

## 2011-10-12 NOTE — Progress Notes (Signed)
Subjective: Postpartum Day 1: Cesarean Delivery Patient reports tolerating PO.    Objective: Vital signs in last 24 hours: Temp:  [97.9 F (36.6 C)-98.4 F (36.9 C)] 98.4 F (36.9 C) (02/22 0539) Pulse Rate:  [61-93] 93  (02/22 0545) Resp:  [10-18] 18  (02/22 0630) BP: (99-143)/(59-100) 99/59 mmHg (02/22 0545) SpO2:  [95 %-100 %] 95 % (02/22 0630) Weight:  [70.761 kg (156 lb)] 70.761 kg (156 lb) (02/21 1718)  Physical Exam:  General: alert, cooperative and appears stated age Lochia: appropriate Uterine Fundus: firm Incision: healing well DVT Evaluation: No evidence of DVT seen on physical exam.   Basename 10/12/11 0539 10/11/11 1715  HGB 10.8* 13.1  HCT 31.6* 38.1    Assessment/Plan: Status post Cesarean section. Doing well postoperatively.  Continue current care.  Alicia Roach L 10/12/2011, 7:40 AM

## 2011-10-13 NOTE — Progress Notes (Signed)
Subjective: Postpartum Day 2: Cesarean Delivery Patient reports tolerating PO, + flatus and no problems voiding.    Objective: Vital signs in last 24 hours: Temp:  [98 F (36.7 C)-98.8 F (37.1 C)] 98.3 F (36.8 C) (02/23 0530) Pulse Rate:  [74-87] 82  (02/23 0530) Resp:  [18-20] 18  (02/23 0530) BP: (102-125)/(62-77) 111/62 mmHg (02/23 0530) SpO2:  [95 %-98 %] 98 % (02/23 0530)  Physical Exam:  General: alert, cooperative and appears stated age Lochia: appropriate Uterine Fundus: firm Incision: healing well, no significant drainage, no dehiscence, no significant erythema DVT Evaluation: No evidence of DVT seen on physical exam.   Basename 10/12/11 0539 10/11/11 1715  HGB 10.8* 13.1  HCT 31.6* 38.1    Assessment/Plan: Status post Cesarean section. Doing well postoperatively.  Continue current care.  Nadina Fomby L 10/13/2011, 8:13 AM

## 2011-10-14 NOTE — Progress Notes (Signed)
LCSW met with family to follow up on questions they had about SSI information that weekday LCSW touched on while MOB was resting.  I met with family at NICU bedside while MOB was skin-to-skin with her newborn.  FOB wanted MOB to be a part of the conversation so that they can both be aware of the information/supports that might be available.  I explained the SSI application that we can submit for qualifying families.  FOB was in the process of gathering his and MOB's insurance information so they can better understand their benefits.  Family was interested to speak more with weekday LCSW when she returns during the week.  Family was enjoying time with their baby at this time.  They were happy and also had family member present with them.   Jossette Zirbel, MSW, LCSW 10/14/2011, 1:52 pm 

## 2011-10-14 NOTE — Progress Notes (Signed)
Subjective: Postpartum Day 4: Cesarean Delivery Patient reports incisional pain, tolerating PO and no problems voiding.    Objective: Vital signs in last 24 hours: Temp:  [98.4 F (36.9 C)-98.7 F (37.1 C)] 98.5 F (36.9 C) (02/24 0617) Pulse Rate:  [81-102] 85  (02/24 0617) Resp:  [18] 18  (02/24 0617) BP: (106-137)/(70-86) 109/72 mmHg (02/24 0617) SpO2:  [96 %-100 %] 99 % (02/24 0617)  Physical Exam:  General: alert, cooperative and appears stated age Lochia: appropriate Uterine Fundus: firm Incision: healing well, no significant drainage, no dehiscence, no significant erythema DVT Evaluation: No evidence of DVT seen on physical exam.   Basename 10/12/11 0539 10/11/11 1715  HGB 10.8* 13.1  HCT 31.6* 38.1    Assessment/Plan: Status post Cesarean section. Doing well postoperatively.  Discharge home tomorrow Baby in NICU.  Mac Dowdell L 10/14/2011, 8:36 AM

## 2011-10-15 ENCOUNTER — Encounter (HOSPITAL_COMMUNITY)
Admit: 2011-10-15 | Discharge: 2011-10-15 | Disposition: A | Discharge status: Home / Self Care | Payer: BC Managed Care – PPO | Attending: Obstetrics and Gynecology | Admitting: Obstetrics and Gynecology

## 2011-10-15 DIAGNOSIS — O923 Agalactia: Secondary | ICD-10-CM | POA: Insufficient documentation

## 2011-10-15 MED ORDER — OXYCODONE-ACETAMINOPHEN 5-325 MG PO TABS: 1.0000 | ORAL_TABLET | Freq: Four times a day (QID) | ORAL | Status: AC | PRN

## 2011-10-15 MED ORDER — IBUPROFEN 800 MG PO TABS: 800.0000 mg | ORAL_TABLET | Freq: Three times a day (TID) | ORAL | Status: AC | PRN

## 2011-10-15 MED ORDER — PRENATAL MULTIVITAMIN CH: 1.0000 | ORAL_TABLET | Freq: Every day | ORAL | Status: DC

## 2011-10-15 NOTE — Progress Notes (Signed)
UR chart review completed.  

## 2011-10-15 NOTE — Discharge Summary (Signed)
Obstetric Discharge Summary Reason for Admission: induction of labor Prenatal Procedures: ultrasound Intrapartum Procedures: cesarean: low cervical, transverse Postpartum Procedures: none Complications-Operative and Postpartum: none Hemoglobin  Date Value Range Status  10/12/2011 10.8* 12.0-15.0 (g/dL) Final     DELTA CHECK NOTED     REPEATED TO VERIFY     HCT  Date Value Range Status  10/12/2011 31.6* 36.0-46.0 (%) Final    Discharge Diagnoses: Term Pregnancy-delivered  Discharge Information: Date: 10/15/2011 Activity: pelvic rest Diet: routine Medications: PNV, Ibuprofen, Percocet and Zoloft and Synthroid Condition: stable Instructions: refer to practice specific booklet Discharge to: home   Newborn Data: Live born female  Birth Weight: 4 lb 2.1 oz (1875 g) APGAR: 4, 7   nicu.  Alicia Roach G 10/15/2011, 8:34 AM

## 2011-10-15 NOTE — Progress Notes (Signed)
Subjective: Postpartum Day 5: Cesarean Delivery Patient reports tolerating PO, + flatus and no problems voiding.  Baby stable in NICU  Objective: Vital signs in last 24 hours: Temp:  [97.7 F (36.5 C)-98.3 F (36.8 C)] 97.7 F (36.5 C) (02/25 0615) Pulse Rate:  [78-90] 81  (02/25 0615) Resp:  [18] 18  (02/25 0615) BP: (113-117)/(66-76) 117/66 mmHg (02/25 0615) SpO2:  [96 %-98 %] 97 % (02/25 0615)  Physical Exam:  General: alert and cooperative Lochia: appropriate Uterine Fundus: firm Incision: healing well DVT Evaluation: No evidence of DVT seen on physical exam.  No results found for this basename: HGB:2,HCT:2 in the last 72 hours  Assessment/Plan: Status post Cesarean section. Doing well postoperatively.  Discharge home with standard precautions and return to clinic in 1 week.  Thai Hemrick G 10/15/2011, 8:17 AM

## 2011-11-14 ENCOUNTER — Encounter (HOSPITAL_COMMUNITY)
Admission: RE | Admit: 2011-11-14 | Discharge: 2011-11-14 | Disposition: A | Discharge status: Home / Self Care | Payer: BC Managed Care – PPO | Source: Ambulatory Visit | Attending: Obstetrics and Gynecology | Admitting: Obstetrics and Gynecology

## 2011-11-14 DIAGNOSIS — O923 Agalactia: Secondary | ICD-10-CM | POA: Insufficient documentation

## 2012-04-23 ENCOUNTER — Ambulatory Visit (INDEPENDENT_AMBULATORY_CARE_PROVIDER_SITE_OTHER): Payer: BC Managed Care – PPO | Admitting: Family Medicine

## 2012-04-23 ENCOUNTER — Encounter: Payer: Self-pay | Admitting: Family Medicine

## 2012-04-23 VITALS — BP 119/85 | HR 105 | Temp 97.7°F | Ht 65.5 in | Wt 138.0 lb

## 2012-04-23 DIAGNOSIS — J069 Acute upper respiratory infection, unspecified: Secondary | ICD-10-CM | POA: Insufficient documentation

## 2012-04-23 DIAGNOSIS — Z23 Encounter for immunization: Secondary | ICD-10-CM

## 2012-04-23 MED ORDER — HYDROCODONE-HOMATROPINE 5-1.5 MG/5ML PO SYRP: ORAL_SOLUTION | ORAL | Status: AC

## 2012-04-23 MED ORDER — IPRATROPIUM BROMIDE 0.03 % NA SOLN: 2.0000 | Freq: Two times a day (BID) | NASAL | Status: DC

## 2012-04-23 NOTE — Assessment & Plan Note (Signed)
Discussed symptomatic care. Rx for hycodan, 1-2 tsp q6h prn, #18ml, no RF. Afrin for congestion, atrovent for runny nose. D/C all other OTC meds.

## 2012-04-23 NOTE — Progress Notes (Signed)
Office Note 04/23/2012  CC:  Chief Complaint  Patient presents with  . Establish Care    cold symptoms    HPI:  Alicia Roach is a 36 y.o. White female who is here to establish care and discuss her respiratory complaints. Patient's most recent primary MD: Dr. Cathey Endow.  Endo: Dr. Talmage Nap.  GYN: Dr. Renaldo Fiddler. Old records in EPIC/HL were reviewed prior to or during today's visit.  Pt presents complaining of respiratory symptoms for 4 days.  Primary symptoms are: nasal congestion/runny nose, ST, cough, HA, sneezing.  Worst symptoms seems to be the "all of them together".  Lately the symptoms seem to be worsening.  Subjective f/c. Pertinent negatives: No wheezing, and no SOB.  No pain in face or teeth.     Symptoms made worse by nothing.  Symptoms improved by OTC cold formulas minimally (these help her sleep some). Smoker? former Recent sick contact? Husband and son with URI recently  Muscle or joint aches? Yes,  Back and arms. Flu shot this season at least 2 wks ago? no  Additional ROS: no n/v/d or abdominal pain.  No rash.  No neck stiffness.   +Mild fatigue.  +Mild appetite loss.   Past Medical History  Diagnosis Date  . Hypothyroidism     s/p radioactive iodine ablation 2010   . GAD (generalized anxiety disorder)     Dr. Ann Maki McKinney--sees her regularly    Past Surgical History  Procedure Date  . Digital reconstructive surgery (middle finger of l hand)   . Tonsillectomy   . Wisdom tooth extraction   . Cesarean section 10/11/2011    Procedure: CESAREAN SECTION;  Surgeon: Meriel Pica, MD;  Location: [facility];  Service: Gynecology;  Laterality: N/A;    Family History  Problem Relation Age of Onset  . Hyperthyroidism Mother   . Depression Mother   . Depression Sister     History   Social History  . Marital Status: Married    Spouse Name: N/A    Number of Children: N/A  . Years of Education: N/A   Occupational History  . Not on file.   Social History  Main Topics  . Smoking status: Former Smoker -- 8 years    Types: Cigarettes    Quit date: 08/20/2001  . Smokeless tobacco: Never Used  . Alcohol Use: Yes  . Drug Use: No  . Sexually Active: Yes    Birth Control/ Protection: None   Other Topics Concern  . 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  Occasional alcohol.    Outpatient Encounter Prescriptions as of 04/23/2012  Medication Sig Dispense Refill  . levonorgestrel-ethinyl estradiol (SEASONALE,INTROVALE,JOLESSA) 0.15-0.03 MG tablet Take 1 tablet by mouth daily.      Marland Kitchen levothyroxine (SYNTHROID, LEVOTHROID) 112 MCG tablet Take 112 mcg by mouth daily.      Marland Kitchen venlafaxine XR (EFFEXOR-XR) 75 MG 24 hr capsule Take 75 mg by mouth daily.      Marland Kitchen HYDROcodone-homatropine (HYCODAN) 5-1.5 MG/5ML syrup 1-2 tsp po q6h prn cough/flu-like symptoms  120 mL  0  . ipratropium (ATROVENT) 0.03 % nasal spray Place 2 sprays into the nose every 12 (twelve) hours.  30 mL  12  . DISCONTD: Prenatal Vit-Fe Fumarate-FA (PRENATAL MULTIVITAMIN) TABS Take 1 tablet by mouth daily.  30 tablet  6  . DISCONTD: sertraline (  ZOLOFT) 50 MG tablet Take 50 mg by mouth daily.        Allergies  Allergen Reactions  . Methimazole Rash    ROS See HPI PE; Blood pressure 119/85, pulse 105, temperature 97.7 F (36.5 C), temperature source Temporal, height 5' 5.5" (1.664 m), weight 138 lb (62.596 kg), SpO2 99.00%, not currently breastfeeding. VS: noted--normal. Gen: alert, NAD, NONTOXIC APPEARING. HEENT: eyes without injection, drainage, or swelling.  Ears: EACs clear, TMs with normal light reflex and landmarks.  Nose: Clear rhinorrhea, with some dried, crusty exudate adherent to mildly injected mucosa.  No purulent d/c.  No paranasal sinus TTP.  No facial swelling.  Throat and mouth without focal lesion.  No pharyngial swelling, erythema, or exudate.     Neck: supple, no LAD.   LUNGS: CTA bilat, nonlabored resps.   CV: RRR, no m/r/g. EXT: no c/c/e SKIN: no rash  Pertinent labs:  None today  ASSESSMENT AND PLAN:   Viral URI Discussed symptomatic care. Rx for hycodan, 1-2 tsp q6h prn, #122ml, no RF. Afrin for congestion, atrovent for runny nose. D/C all other OTC meds.  Flu vaccine IM today.  Return if symptoms worsen or fail to improve.

## 2012-04-23 NOTE — Patient Instructions (Signed)
Buy generic/store brand Afrin (oxymetazolone) and use 2 sprays each nostril q12h as needed for nasal CONGESTION.  Do not use this more than 3 days in a row.

## 2012-07-02 ENCOUNTER — Encounter: Payer: Self-pay | Admitting: Family Medicine

## 2012-07-02 ENCOUNTER — Encounter: Payer: Self-pay | Admitting: *Deleted

## 2012-07-02 ENCOUNTER — Ambulatory Visit (INDEPENDENT_AMBULATORY_CARE_PROVIDER_SITE_OTHER): Payer: BC Managed Care – PPO | Admitting: Family Medicine

## 2012-07-02 VITALS — BP 133/84 | HR 90 | Temp 99.0°F | Ht 65.5 in | Wt 130.0 lb

## 2012-07-02 DIAGNOSIS — J029 Acute pharyngitis, unspecified: Secondary | ICD-10-CM

## 2012-07-02 MED ORDER — AMOXICILLIN-POT CLAVULANATE 875-125 MG PO TABS: 1.0000 | ORAL_TABLET | Freq: Two times a day (BID) | ORAL | Status: DC

## 2012-07-02 NOTE — Assessment & Plan Note (Addendum)
Rapid strep negative in office today. Patient has asymmetric pain but no significant sign of abscess forming on that side.   Since she has hx of this complication from Group A strep, however, I'll start augmentin 875/125 bid and send group A strep probe.  If this returns negative then will recommend she go ahead and d/c the antibiotic.  She declined my offer of stronger med for her throat pain, so she'll continue tylenol 1000 mg q6h prn.

## 2012-07-02 NOTE — Progress Notes (Signed)
OFFICE NOTE  07/02/2012  CC:  Chief Complaint  Patient presents with  . Sore Throat    since Saturday, no fever; hx of hospitalization for throat abscess     HPI: Patient is a 36 y.o. Caucasian female who is here for sore throat, mainly left side, onset 4d/a and getting progressively worse.  Nasal congestion upon awakining but blows nose and is fine after that.  No fever.  Dry cough.  No wheezing or chest tightness. Tylenol helps pain some.  No known sick contacts lately.  No rash.   Pertinent PMH:  Past Medical History  Diagnosis Date  . Hypothyroidism     s/p radioactive iodine ablation 2010   . GAD (generalized anxiety disorder)     Dr. Bonner Puna her regularly    MEDS:  Outpatient Prescriptions Prior to Visit  Medication Sig Dispense Refill  . levonorgestrel-ethinyl estradiol (SEASONALE,INTROVALE,JOLESSA) 0.15-0.03 MG tablet Take 1 tablet by mouth daily.      Marland Kitchen levothyroxine (SYNTHROID, LEVOTHROID) 112 MCG tablet Take 112 mcg by mouth daily.      Marland Kitchen venlafaxine XR (EFFEXOR-XR) 75 MG 24 hr capsule Take 75 mg by mouth daily.      . [DISCONTINUED] ipratropium (ATROVENT) 0.03 % nasal spray Place 2 sprays into the nose every 12 (twelve) hours.  30 mL  12   Last reviewed on 07/02/2012  3:20 PM by Jeoffrey Massed, MD  PE: Blood pressure 133/84, pulse 90, temperature 99 F (37.2 C), temperature source Temporal, height 5' 5.5" (1.664 m), weight 130 lb (58.968 kg), SpO2 99.00%. Gen: Alert, well appearing.  Patient is oriented to person, place, time, and situation. AFFECT: pleasant, lucid thought and speech. ENT: Ears: EACs clear, normal epithelium.  TMs with good light reflex and landmarks bilaterally.  Eyes: no injection, icteris, swelling, or exudate.  EOMI, PERRLA. Nose: no drainage or turbinate edema/swelling.  No injection or focal lesion.  Mouth: lips without lesion/swelling.  Oral mucosa pink and moist.  Dentition intact and without obvious caries or gingival  swelling.  Oropharynx shows no focal lesion, no exudate.  Mild diffuse erythema of soft palayte and post pharynx and tonsillar regions (where she has no tonsils), with a hint of more fullness on left side than the right, but no uvula deviation and no remarkable soft tissue fullness to suggest abscess formation.  Left submandibular LN palpalbe and mildly TTP.  IMPRESSION AND PLAN: . Pharyngitis, acute Rapid strep negative in office today. Patient has asymmetric pain but no significant sign of abscess forming on that side.   Since she has hx of this complication from Group A strep, however, I'll start augmentin 875/125 bid and send group A strep probe.  If this returns negative then will recommend she go ahead and d/c the antibiotic.  She declined my offer of stronger med for her throat pain, so she'll continue tylenol 1000 mg q6h prn.     An After Visit Summary was printed and given to the patient.  FOLLOW UP: prn

## 2012-07-04 LAB — CULTURE, GROUP A STREP: Organism ID, Bacteria: NORMAL

## 2012-10-22 ENCOUNTER — Ambulatory Visit (INDEPENDENT_AMBULATORY_CARE_PROVIDER_SITE_OTHER): Payer: BC Managed Care – PPO | Admitting: Licensed Clinical Social Worker

## 2012-10-29 ENCOUNTER — Ambulatory Visit (INDEPENDENT_AMBULATORY_CARE_PROVIDER_SITE_OTHER): Payer: BC Managed Care – PPO | Admitting: Licensed Clinical Social Worker

## 2012-11-06 ENCOUNTER — Ambulatory Visit (INDEPENDENT_AMBULATORY_CARE_PROVIDER_SITE_OTHER): Payer: BC Managed Care – PPO | Admitting: Licensed Clinical Social Worker

## 2012-11-06 DIAGNOSIS — F39 Unspecified mood [affective] disorder: Secondary | ICD-10-CM

## 2012-11-12 ENCOUNTER — Ambulatory Visit (INDEPENDENT_AMBULATORY_CARE_PROVIDER_SITE_OTHER): Payer: BC Managed Care – PPO | Admitting: Licensed Clinical Social Worker

## 2012-11-12 DIAGNOSIS — F39 Unspecified mood [affective] disorder: Secondary | ICD-10-CM

## 2012-11-20 ENCOUNTER — Ambulatory Visit (INDEPENDENT_AMBULATORY_CARE_PROVIDER_SITE_OTHER): Payer: BC Managed Care – PPO | Admitting: Licensed Clinical Social Worker

## 2012-11-20 DIAGNOSIS — F39 Unspecified mood [affective] disorder: Secondary | ICD-10-CM

## 2012-11-27 ENCOUNTER — Ambulatory Visit (INDEPENDENT_AMBULATORY_CARE_PROVIDER_SITE_OTHER): Payer: BC Managed Care – PPO | Admitting: Licensed Clinical Social Worker

## 2012-11-27 DIAGNOSIS — F39 Unspecified mood [affective] disorder: Secondary | ICD-10-CM

## 2012-12-09 ENCOUNTER — Ambulatory Visit (INDEPENDENT_AMBULATORY_CARE_PROVIDER_SITE_OTHER): Payer: BC Managed Care – PPO | Admitting: Licensed Clinical Social Worker

## 2012-12-09 DIAGNOSIS — F39 Unspecified mood [affective] disorder: Secondary | ICD-10-CM

## 2012-12-24 ENCOUNTER — Ambulatory Visit (INDEPENDENT_AMBULATORY_CARE_PROVIDER_SITE_OTHER): Payer: BC Managed Care – PPO | Admitting: Licensed Clinical Social Worker

## 2012-12-24 DIAGNOSIS — F39 Unspecified mood [affective] disorder: Secondary | ICD-10-CM

## 2013-01-06 ENCOUNTER — Ambulatory Visit (INDEPENDENT_AMBULATORY_CARE_PROVIDER_SITE_OTHER): Payer: BC Managed Care – PPO | Admitting: Licensed Clinical Social Worker

## 2013-01-06 DIAGNOSIS — F39 Unspecified mood [affective] disorder: Secondary | ICD-10-CM

## 2013-01-21 ENCOUNTER — Ambulatory Visit (INDEPENDENT_AMBULATORY_CARE_PROVIDER_SITE_OTHER): Payer: BC Managed Care – PPO | Admitting: Licensed Clinical Social Worker

## 2013-01-21 DIAGNOSIS — F39 Unspecified mood [affective] disorder: Secondary | ICD-10-CM

## 2013-02-05 ENCOUNTER — Ambulatory Visit: Payer: BC Managed Care – PPO | Admitting: Licensed Clinical Social Worker

## 2013-02-19 ENCOUNTER — Ambulatory Visit (INDEPENDENT_AMBULATORY_CARE_PROVIDER_SITE_OTHER): Payer: BC Managed Care – PPO | Admitting: Licensed Clinical Social Worker

## 2013-02-19 DIAGNOSIS — F39 Unspecified mood [affective] disorder: Secondary | ICD-10-CM

## 2013-03-10 ENCOUNTER — Ambulatory Visit (INDEPENDENT_AMBULATORY_CARE_PROVIDER_SITE_OTHER): Payer: BC Managed Care – PPO | Admitting: Licensed Clinical Social Worker

## 2013-03-10 DIAGNOSIS — F39 Unspecified mood [affective] disorder: Secondary | ICD-10-CM

## 2013-06-22 ENCOUNTER — Encounter: Payer: Self-pay | Admitting: Emergency Medicine

## 2013-06-22 ENCOUNTER — Emergency Department
Admission: EM | Admit: 2013-06-22 | Discharge: 2013-06-22 | Disposition: A | Discharge status: Home / Self Care | Payer: BC Managed Care – PPO | Source: Home / Self Care | Attending: Family Medicine | Admitting: Family Medicine

## 2013-06-22 DIAGNOSIS — J069 Acute upper respiratory infection, unspecified: Secondary | ICD-10-CM

## 2013-06-22 DIAGNOSIS — H6501 Acute serous otitis media, right ear: Secondary | ICD-10-CM

## 2013-06-22 DIAGNOSIS — H6500 Acute serous otitis media, unspecified ear: Secondary | ICD-10-CM

## 2013-06-22 MED ORDER — AMOXICILLIN 875 MG PO TABS: 875.0000 mg | ORAL_TABLET | Freq: Two times a day (BID) | ORAL | Status: DC

## 2013-06-22 NOTE — ED Notes (Signed)
Started last Monday with cold sxs, which improved, but this weekend, started having sinus pain, pressure, headache, congestion, runny nose, rt earache, rt side glands swollen and painful.

## 2013-06-22 NOTE — ED Provider Notes (Signed)
CSN: [id number]     Arrival date & time 06/22/13  1829 History   First MD Initiated Contact with Patient 06/22/13 1859     Chief Complaint  Patient presents with  . Sinus Problem      HPI Comments: Patient developed mild URI symptoms one week ago with sore throat, nasal congestion, myalgias, and fatigue.  A non-productive cough developed five days ago.  She has developed a right earache and decreased hearing today.  The history is provided by the patient.    Past Medical History  Diagnosis Date  . Hypothyroidism     s/p radioactive iodine ablation 2010   . GAD (generalized anxiety disorder)     Dr. Ann Maki McKinney--sees her regularly  . Retropharyngeal abscess 1997   Past Surgical History  Procedure Laterality Date  . Digital reconstructive surgery (middle finger of l hand)    . Tonsillectomy  1999  . Wisdom tooth extraction    . Cesarean section  10/11/2011    Procedure: CESAREAN SECTION;  Surgeon: Meriel Pica, MD;  Location: [facility];  Service: Gynecology;  Laterality: N/A;   Family History  Problem Relation Age of Onset  . Hyperthyroidism Mother   . Depression Mother   . Depression Sister    History  Substance Use Topics  . Smoking status: Former Smoker -- 8 years    Types: Cigarettes    Quit date: 08/20/2001  . Smokeless tobacco: Never Used  . Alcohol Use: Yes   OB History   Grav Para Term Preterm Abortions TAB SAB Ect Mult Living   5 1 1  0 4 4 0 0 0 1     Review of Systems + sore throat + cough No pleuritic pain No wheezing + nasal congestion + post-nasal drainage No sinus pain/pressure No itchy/red eyes + right earache No hemoptysis No SOB No fever/chills No nausea No vomiting No abdominal pain No diarrhea No urinary symptoms No skin rash + fatigue + myalgias, resolved + headache Used OTC meds without relief  Allergies  Methimazole  Home Medications   Current Outpatient Rx  Name  Route  Sig  Dispense  Refill  . amoxicillin  (AMOXIL) 875 MG tablet   Oral   Take 1 tablet (875 mg total) by mouth 2 (two) times daily.   20 tablet   0   . levonorgestrel-ethinyl estradiol (SEASONALE,INTROVALE,JOLESSA) 0.15-0.03 MG tablet   Oral   Take 1 tablet by mouth daily.         Marland Kitchen levothyroxine (SYNTHROID, LEVOTHROID) 112 MCG tablet   Oral   Take 112 mcg by mouth daily.         Marland Kitchen venlafaxine XR (EFFEXOR-XR) 75 MG 24 hr capsule   Oral   Take 75 mg by mouth daily.          BP 132/80  Pulse 78  Temp(Src) 98.3 F (36.8 C) (Oral)  Ht 5\' 6"  (1.676 m)  Wt 128 lb (58.06 kg)  BMI 20.67 kg/m2  SpO2 100%  LMP 03/22/2013  Breastfeeding? No Physical Exam Nursing notes and Vital Signs reviewed. Appearance:  Patient appears healthy, stated age, and in no acute distress Eyes:  Pupils are equal, round, and reactive to light and accomodation.  Extraocular movement is intact.  Conjunctivae are not inflamed  Ears:  Canals normal.  Left tympanic membrane normal.  Right tympanic membrane has serous effusion and is mildly erythematous  Nose:   Congested turbinates, worse on right.  No sinus tenderness.   Pharynx:  Normal Neck:  Supple.   Shotty posterior nodes are palpated bilaterally  Lungs:  Clear to auscultation.  Breath sounds are equal.  Heart:  Regular rate and rhythm without murmurs, rubs, or gallops.  Abdomen:  Nontender without masses or hepatosplenomegaly.  Bowel sounds are present.  No CVA or flank tenderness.  Extremities:  No edema.  No calf tenderness Skin:  No rash present.   ED Course  Procedures  none       MDM   1. Acute upper respiratory infections of unspecified site   2. Right acute serous otitis media     Begin amoxicillin Take Mucinex D (guaifenesin with decongestant) twice daily for congestion (or may take plain Mucinex and Sudafed).  Increase fluid intake, rest. May use Afrin nasal spray (or generic oxymetazoline) twice daily for about 5 days.  Also recommend using saline nasal spray several  times daily and saline nasal irrigation (AYR is a common brand) May take Delsym Cough Suppressant at bedtime for nighttime cough.  Stop all antihistamines for now, and other non-prescription cough/cold preparations. Follow-up with family doctor if not improving about10 days.     Lattie Haw, MD 06/22/13 1945

## 2014-02-24 ENCOUNTER — Encounter: Payer: Self-pay | Admitting: Family Medicine

## 2014-02-24 ENCOUNTER — Ambulatory Visit (INDEPENDENT_AMBULATORY_CARE_PROVIDER_SITE_OTHER): Payer: BC Managed Care – PPO | Admitting: Family Medicine

## 2014-02-24 VITALS — BP 111/80 | HR 120 | Temp 98.4°F | Ht 65.5 in | Wt 131.0 lb

## 2014-02-24 DIAGNOSIS — M545 Low back pain, unspecified: Secondary | ICD-10-CM

## 2014-02-24 MED ORDER — HYDROCODONE-ACETAMINOPHEN 5-325 MG PO TABS: 1.0000 | ORAL_TABLET | Freq: Three times a day (TID) | ORAL | Status: DC | PRN

## 2014-02-24 NOTE — Patient Instructions (Signed)
Take 3 OTC generic ibuprofen tabs with food two to three times per day (6 hours between doses) for 10 days  Back Exercises These exercises may help you when beginning to rehabilitate your injury. Your symptoms may resolve with or without further involvement from your physician, physical therapist or athletic trainer. While completing these exercises, remember:   Restoring tissue flexibility helps normal motion to return to the joints. This allows healthier, less painful movement and activity.  An effective stretch should be held for at least 30 seconds.  A stretch should never be painful. You should only feel a gentle lengthening or release in the stretched tissue. STRETCH - Extension, Prone on Elbows   Lie on your stomach on the floor, a bed will be too soft. Place your palms about shoulder width apart and at the height of your head.  Place your elbows under your shoulders. If this is too painful, stack pillows under your chest.  Allow your body to relax so that your hips drop lower and make contact more completely with the floor.  Hold this position for __________ seconds.  Slowly return to lying flat on the floor. Repeat __________ times. Complete this exercise __________ times per day.  RANGE OF MOTION - Extension, Prone Press Ups   Lie on your stomach on the floor, a bed will be too soft. Place your palms about shoulder width apart and at the height of your head.  Keeping your back as relaxed as possible, slowly straighten your elbows while keeping your hips on the floor. You may adjust the placement of your hands to maximize your comfort. As you gain motion, your hands will come more underneath your shoulders.  Hold this position __________ seconds.  Slowly return to lying flat on the floor. Repeat __________ times. Complete this exercise __________ times per day.  RANGE OF MOTION- Quadruped, Neutral Spine   Assume a hands and knees position on a firm surface. Keep your hands  under your shoulders and your knees under your hips. You may place padding under your knees for comfort.  Drop your head and point your tail bone toward the ground below you. This will round out your low back like an angry cat. Hold this position for __________ seconds.  Slowly lift your head and release your tail bone so that your back sags into a large arch, like an old horse.  Hold this position for __________ seconds.  Repeat this until you feel limber in your low back.  Now, find your "sweet spot." This will be the most comfortable position somewhere between the two previous positions. This is your neutral spine. Once you have found this position, tense your stomach muscles to support your low back.  Hold this position for __________ seconds. Repeat __________ times. Complete this exercise __________ times per day.  STRETCH - Flexion, Single Knee to Chest   Lie on a firm bed or floor with both legs extended in front of you.  Keeping one leg in contact with the floor, bring your opposite knee to your chest. Hold your leg in place by either grabbing behind your thigh or at your knee.  Pull until you feel a gentle stretch in your low back. Hold __________ seconds.  Slowly release your grasp and repeat the exercise with the opposite side. Repeat __________ times. Complete this exercise __________ times per day.  STRETCH - Hamstrings, Standing  Stand or sit and extend your right / left leg, placing your foot on a chair or  foot stool  Keeping a slight arch in your low back and your hips straight forward.  Lead with your chest and lean forward at the waist until you feel a gentle stretch in the back of your right / left knee or thigh. (When done correctly, this exercise requires leaning only a small distance.)  Hold this position for __________ seconds. Repeat __________ times. Complete this stretch __________ times per day. STRENGTHENING - Deep Abdominals, Pelvic Tilt   Lie on a  firm bed or floor. Keeping your legs in front of you, bend your knees so they are both pointed toward the ceiling and your feet are flat on the floor.  Tense your lower abdominal muscles to press your low back into the floor. This motion will rotate your pelvis so that your tail bone is scooping upwards rather than pointing at your feet or into the floor.  With a gentle tension and even breathing, hold this position for __________ seconds. Repeat __________ times. Complete this exercise __________ times per day.  STRENGTHENING - Abdominals, Crunches   Lie on a firm bed or floor. Keeping your legs in front of you, bend your knees so they are both pointed toward the ceiling and your feet are flat on the floor. Cross your arms over your chest.  Slightly tip your chin down without bending your neck.  Tense your abdominals and slowly lift your trunk high enough to just clear your shoulder blades. Lifting higher can put excessive stress on the low back and does not further strengthen your abdominal muscles.  Control your return to the starting position. Repeat __________ times. Complete this exercise __________ times per day.  STRENGTHENING - Quadruped, Opposite UE/LE Lift   Assume a hands and knees position on a firm surface. Keep your hands under your shoulders and your knees under your hips. You may place padding under your knees for comfort.  Find your neutral spine and gently tense your abdominal muscles so that you can maintain this position. Your shoulders and hips should form a rectangle that is parallel with the floor and is not twisted.  Keeping your trunk steady, lift your right hand no higher than your shoulder and then your left leg no higher than your hip. Make sure you are not holding your breath. Hold this position __________ seconds.  Continuing to keep your abdominal muscles tense and your back steady, slowly return to your starting position. Repeat with the opposite arm and  leg. Repeat __________ times. Complete this exercise __________ times per day. Document Released: 08/24/2005 Document Revised: 10/29/2011 Document Reviewed: 11/18/2008 [facility] Patient Information 2015 [place], [place]. This information is not intended to replace advice given to you by your health care provider. Make sure you discuss any questions you have with your health care provider.

## 2014-02-24 NOTE — Progress Notes (Signed)
OFFICE VISIT  02/28/2014   CC:  Chief Complaint  Patient presents with  . Back Pain   HPI:    Patient is a 38 y.o. Caucasian female who presents for back pain. Awoke 2 days ago with back pain in low back, near tail bone, mainly centrally located.  No radiation, no paresthesias. Took Dones OTC --no help.  Heating pad off and on.  Gradually worsening.  Recalls no acute strain or trauma/accident. She has a 36 y/o that she does have to pick up fruequently.  Past Medical History  Diagnosis Date  . Hypothyroidism     s/p radioactive iodine ablation 2010   . GAD (generalized anxiety disorder)     Dr. Ann MakiParrish McKinney--sees her regularly  . Retropharyngeal abscess 1997    Past Surgical History  Procedure Laterality Date  . Digital reconstructive surgery (middle finger of l hand)    . Tonsillectomy  1999  . Wisdom tooth extraction    . Cesarean section  10/11/2011    Procedure: CESAREAN SECTION;  Surgeon: Meriel Picaichard M Holland, MD;  Location: [facility];  Service: Gynecology;  Laterality: N/A;   MEDS: not taking amoxil listed below Outpatient Prescriptions Prior to Visit  Medication Sig Dispense Refill  . levonorgestrel-ethinyl estradiol (SEASONALE,INTROVALE,JOLESSA) 0.15-0.03 MG tablet Take 1 tablet by mouth daily.      Marland Kitchen. venlafaxine XR (EFFEXOR-XR) 75 MG 24 hr capsule Take 75 mg by mouth daily.      Marland Kitchen. amoxicillin (AMOXIL) 875 MG tablet Take 1 tablet (875 mg total) by mouth 2 (two) times daily.  20 tablet  0  . levothyroxine (SYNTHROID, LEVOTHROID) 112 MCG tablet Take 112 mcg by mouth daily.       No facility-administered medications prior to visit.    Allergies  Allergen Reactions  . Methimazole Rash    ROS As per HPI  PE: Blood pressure 111/80, pulse 120, temperature 98.4 F (36.9 C), temperature source Temporal, height 5' 5.5" (1.664 m), weight 131 lb (59.421 kg), SpO2 98.00%. Gen: Alert, well appearing.  Patient is oriented to person, place, time, and situation. Low back ROM  intact but feels pain in center of L/S spine with all ROM except rotation. She has TTP in L/S intersection centrally.   FABER maneuver on right leg elicits some pain in central/right LS region.   Sitting SLR neg bilat.  LE strength 5/5 prox and dist bilat.  No sensory deficits of LE's. DTRs 2+ in patellar and achilles regions bilat.   LABS:  None  IMPRESSION AND PLAN:  Acute low back pain Musculoskeletal strain: discussed home stretches and gave pt handout for these. Ibuprofen 600 mg bid-tid with food x 10d. Vicodin 5/325, 1-2 q6h prn, #20, no RF. No imaging indicated at this time.    An After Visit Summary was printed and given to the patient.  FOLLOW UP: Return if symptoms worsen or fail to improve.

## 2014-02-24 NOTE — Progress Notes (Signed)
Pre visit review using our clinic review tool, if applicable. No additional management support is needed unless otherwise documented below in the visit note. 

## 2014-02-28 DIAGNOSIS — M545 Low back pain, unspecified: Secondary | ICD-10-CM | POA: Insufficient documentation

## 2014-02-28 NOTE — Assessment & Plan Note (Signed)
Musculoskeletal strain: discussed home stretches and gave pt handout for these. Ibuprofen 600 mg bid-tid with food x 10d. Vicodin 5/325, 1-2 q6h prn, #20, no RF. No imaging indicated at this time.

## 2014-06-21 ENCOUNTER — Encounter: Payer: Self-pay | Admitting: Family Medicine

## 2015-01-13 ENCOUNTER — Other Ambulatory Visit: Payer: Self-pay | Admitting: Obstetrics and Gynecology

## 2015-01-18 LAB — CYTOLOGY - PAP

## 2015-02-08 ENCOUNTER — Emergency Department
Admission: EM | Admit: 2015-02-08 | Discharge: 2015-02-08 | Disposition: A | Discharge status: Home / Self Care | Payer: BLUE CROSS/BLUE SHIELD | Source: Home / Self Care | Attending: Family Medicine | Admitting: Family Medicine

## 2015-02-08 ENCOUNTER — Encounter: Payer: Self-pay | Admitting: *Deleted

## 2015-02-08 DIAGNOSIS — R000 Tachycardia, unspecified: Secondary | ICD-10-CM | POA: Diagnosis not present

## 2015-02-08 DIAGNOSIS — J029 Acute pharyngitis, unspecified: Secondary | ICD-10-CM | POA: Diagnosis not present

## 2015-02-08 MED ORDER — IPRATROPIUM BROMIDE 0.06 % NA SOLN: 2.0000 | Freq: Four times a day (QID) | NASAL | Status: DC

## 2015-02-08 MED ORDER — PREDNISONE 10 MG PO TABS: 30.0000 mg | ORAL_TABLET | Freq: Every day | ORAL | Status: DC

## 2015-02-08 NOTE — ED Notes (Signed)
Pt c/o URI s/s x 2 days, with RT ear pain x today. She took hydrocodone this morning with some relief.

## 2015-02-08 NOTE — ED Provider Notes (Signed)
Alicia Roach is a 39 y.o. female who presents to Urgent Care today for right ear pain and congestion worse voice sore throat. Symptoms present for 2 days. She notes subjective fevers and chills that have improved. No nausea vomiting or diarrhea. She denies any chest pains or palpitations but does note a history of elevated heart rate in the past. She took a hydrocodone tablet for her pain which helped a lot.   Past Medical History  Diagnosis Date  . Hypothyroidism     s/p radioactive iodine ablation 2010   . GAD (generalized anxiety disorder)     Dr. Ann Maki McKinney--sees her regularly  . Retropharyngeal abscess 1997   Past Surgical History  Procedure Laterality Date  . Digital reconstructive surgery (middle finger of l hand)    . Tonsillectomy  1999  . Wisdom tooth extraction    . Cesarean section  10/11/2011    Procedure: CESAREAN SECTION;  Surgeon: Meriel Pica, MD;  Location: [facility];  Service: Gynecology;  Laterality: N/A;   History  Substance Use Topics  . Smoking status: Former Smoker -- 8 years    Types: Cigarettes    Quit date: 08/20/2001  . Smokeless tobacco: Never Used  . Alcohol Use: Yes   ROS as above Medications: No current facility-administered medications for this encounter.   Current Outpatient Prescriptions  Medication Sig Dispense Refill  . HYDROcodone-acetaminophen (NORCO/VICODIN) 5-325 MG per tablet Take 1-2 tablets by mouth 3 (three) times daily as needed for moderate pain. 20 tablet 0  . ipratropium (ATROVENT) 0.06 % nasal spray Place 2 sprays into both nostrils 4 (four) times daily. 15 mL 1  . levonorgestrel-ethinyl estradiol (SEASONALE,INTROVALE,JOLESSA) 0.15-0.03 MG tablet Take 1 tablet by mouth daily.    Marland Kitchen levothyroxine (SYNTHROID, LEVOTHROID) 100 MCG tablet Take 100 mcg by mouth daily before breakfast.    . predniSONE (DELTASONE) 10 MG tablet Take 3 tablets (30 mg total) by mouth daily. 15 tablet 0  . venlafaxine XR (EFFEXOR-XR) 75 MG 24 hr  capsule Take 75 mg by mouth daily.     Allergies  Allergen Reactions  . Methimazole Rash     Exam:  BP 121/81 mmHg  Pulse 118  Temp(Src) 99.3 F (37.4 C) (Oral)  Resp 18  Ht 5\' 6"  (1.676 m)  Wt 135 lb (61.236 kg)  BMI 21.80 kg/m2  SpO2 96% Gen: Well NAD HEENT: EOMI,  MMM posterior pharynx with cobblestoning. Normal tympanic membranes bilaterally. Cervical lymphadenopathy is present bilaterally. Lungs: Normal work of breathing. CTABL Heart: Tachycardia present and regular rhythm no MRG. Heart rate 106 per minute check Abd: NABS, Soft. Nondistended, Nontender Exts: Brisk capillary refill, warm and well perfused.   No results found for this or any previous visit (from the past 24 hour(s)). No results found.  Assessment and Plan: 39 y.o. female with pharyngitis unclear etiology likely viral. Treat with prednisone and Atrovent nasal spray. Follow-up with PCP to evaluate chronically elevated heart rate.  Discussed warning signs or symptoms. Please see discharge instructions. Patient expresses understanding.     Rodolph Bong, MD 02/08/15 [phone]

## 2015-02-08 NOTE — Discharge Instructions (Signed)
Thank you for coming in today. Take prednisone daily for 5 days. Use Atrovent nasal spray. Follow-up with primary care provider for workup of elevated heart rate. Call or go to the emergency room if you get worse, have trouble breathing, have chest pains, or palpitations.   Pharyngitis Pharyngitis is redness, pain, and swelling (inflammation) of your pharynx.  CAUSES  Pharyngitis is usually caused by infection. Most of the time, these infections are from viruses (viral) and are part of a cold. However, sometimes pharyngitis is caused by bacteria (bacterial). Pharyngitis can also be caused by allergies. Viral pharyngitis may be spread from person to person by coughing, sneezing, and personal items or utensils (cups, forks, spoons, toothbrushes). Bacterial pharyngitis may be spread from person to person by more intimate contact, such as kissing.  SIGNS AND SYMPTOMS  Symptoms of pharyngitis include:   Sore throat.   Tiredness (fatigue).   Low-grade fever.   Headache.  Joint pain and muscle aches.  Skin rashes.  Swollen lymph nodes.  Plaque-like film on throat or tonsils (often seen with bacterial pharyngitis). DIAGNOSIS  Your health care provider will ask you questions about your illness and your symptoms. Your medical history, along with a physical exam, is often all that is needed to diagnose pharyngitis. Sometimes, a rapid strep test is done. Other lab tests may also be done, depending on the suspected cause.  TREATMENT  Viral pharyngitis will usually get better in 3-4 days without the use of medicine. Bacterial pharyngitis is treated with medicines that kill germs (antibiotics).  HOME CARE INSTRUCTIONS   Drink enough water and fluids to keep your urine clear or pale yellow.   Only take over-the-counter or prescription medicines as directed by your health care provider:   If you are prescribed antibiotics, make sure you finish them even if you start to feel better.   Do  not take aspirin.   Get lots of rest.   Gargle with 8 oz of salt water ( tsp of salt per 1 qt of water) as often as every 1-2 hours to soothe your throat.   Throat lozenges (if you are not at risk for choking) or sprays may be used to soothe your throat. SEEK MEDICAL CARE IF:   You have large, tender lumps in your neck.  You have a rash.  You cough up green, yellow-brown, or bloody spit. SEEK IMMEDIATE MEDICAL CARE IF:   Your neck becomes stiff.  You drool or are unable to swallow liquids.  You vomit or are unable to keep medicines or liquids down.  You have severe pain that does not go away with the use of recommended medicines.  You have trouble breathing (not caused by a stuffy nose). MAKE SURE YOU:   Understand these instructions.  Will watch your condition.  Will get help right away if you are not doing well or get worse. Document Released: 08/06/2005 Document Revised: 05/27/2013 Document Reviewed: 04/13/2013 [facility] Patient Information 2015 [place], [place]. This information is not intended to replace advice given to you by your health care provider. Make sure you discuss any questions you have with your health care provider.

## 2015-02-11 ENCOUNTER — Telehealth: Payer: Self-pay | Admitting: Emergency Medicine

## 2015-02-11 NOTE — ED Notes (Signed)
Inquired about patient's status; encourage them to call with questions/concerns.  

## 2015-06-15 ENCOUNTER — Encounter: Payer: Self-pay | Admitting: Emergency Medicine

## 2015-06-15 ENCOUNTER — Emergency Department
Admission: EM | Admit: 2015-06-15 | Discharge: 2015-06-15 | Disposition: A | Discharge status: Home / Self Care | Payer: BLUE CROSS/BLUE SHIELD | Source: Home / Self Care | Attending: Family Medicine | Admitting: Family Medicine

## 2015-06-15 DIAGNOSIS — S39012A Strain of muscle, fascia and tendon of lower back, initial encounter: Secondary | ICD-10-CM | POA: Diagnosis not present

## 2015-06-15 MED ORDER — PREDNISONE 20 MG PO TABS: ORAL_TABLET | ORAL | Status: DC

## 2015-06-15 MED ORDER — MELOXICAM 7.5 MG PO TABS: ORAL_TABLET | ORAL | Status: DC

## 2015-06-15 MED ORDER — HYDROCODONE-ACETAMINOPHEN 5-325 MG PO TABS: 1.0000 | ORAL_TABLET | Freq: Four times a day (QID) | ORAL | Status: DC | PRN

## 2015-06-15 MED ORDER — CYCLOBENZAPRINE HCL 10 MG PO TABS: 10.0000 mg | ORAL_TABLET | Freq: Two times a day (BID) | ORAL | Status: DC | PRN

## 2015-06-15 NOTE — ED Provider Notes (Signed)
CSN: [id number]     Arrival date & time 06/15/15  1357 History   First MD Initiated Contact with Patient 06/15/15 1358     Chief Complaint  Patient presents with  . Back Pain   (Consider location/radiation/quality/duration/timing/severity/associated sxs/prior Treatment) HPI [facility] 39[facility] gradually worsening lower back pain that is aching and sore but sharp and stabbing on occasion with certain movements. Symptoms started about 3 days ago.  Pt denies trauma, no known injuries but does note she has a 37 year old son.  Pt also notes she picks up walnuts in her yard but does not recall an instance that caused the initial back pain.  Pt states she first felt pain getting off the couch from a seated position. She has tried ibuprofen and an OTC back pain medication w/o relief.  Denies numbness or tingling in arms or legs. Denies change in bowel or bladder habits. Denies hx of back surgeries but does state she gets lower back pain about once a year.   Past Medical History  Diagnosis Date  . Hypothyroidism     s/p radioactive iodine ablation 2010   . GAD (generalized anxiety disorder)     Dr. Ann MakiParrish McKinney--sees her regularly  . Retropharyngeal abscess 1997   Past Surgical History  Procedure Laterality Date  . Digital reconstructive surgery (middle finger of l hand)    . Tonsillectomy  1999  . Wisdom tooth extraction    . Cesarean section  10/11/2011    Procedure: CESAREAN SECTION;  Surgeon: Meriel Picaichard M Holland, MD;  Location: [facility];  Service: Gynecology;  Laterality: N/A;   Family History  Problem Relation Age of Onset  . Hyperthyroidism Mother   . Depression Mother   . Depression Sister    Social History  Substance Use Topics  . Smoking status: Former Smoker -- 8 years    Types: Cigarettes    Quit date: 08/20/2001  . Smokeless tobacco: Never Used  . Alcohol Use: Yes   OB History    Gravida Para Term Preterm AB TAB SAB Ectopic Multiple Living   5 1  1  0 4 4 0 0 0 1     Review of Systems  Constitutional: Negative for fever and chills.  Gastrointestinal: Negative for nausea, vomiting, abdominal pain and diarrhea.  Genitourinary: Negative for dysuria, frequency, hematuria and flank pain.  Musculoskeletal: Positive for myalgias and back pain. Negative for joint swelling and gait problem.  Skin: Negative for rash and wound.    Allergies  Methimazole  Home Medications   Prior to Admission medications   Medication Sig Start Date End Date Taking? Authorizing Provider  ibuprofen (ADVIL,MOTRIN) 200 MG tablet Take 200 mg by mouth every 6 (six) hours as needed.   Yes Historical Provider, MD  cyclobenzaprine (FLEXERIL) 10 MG tablet Take 1 tablet (10 mg total) by mouth 2 (two) times daily as needed for muscle spasms. 06/15/15   Junius FinnerErin O'Malley, PA-C  HYDROcodone-acetaminophen (NORCO/VICODIN) 5-325 MG per tablet Take 1-2 tablets by mouth 3 (three) times daily as needed for moderate pain. 02/24/14   Jeoffrey MassedPhilip H McGowen, MD  HYDROcodone-acetaminophen (NORCO/VICODIN) 5-325 MG tablet Take 1 tablet by mouth every 6 (six) hours as needed for moderate pain or severe pain. 06/15/15   Junius FinnerErin O'Malley, PA-C  ipratropium (ATROVENT) 0.06 % nasal spray Place 2 sprays into both nostrils 4 (four) times daily. 02/08/15   Rodolph BongEvan S Corey, MD  levonorgestrel-ethinyl estradiol (SEASONALE,INTROVALE,JOLESSA) 0.15-0.03 MG tablet Take 1  tablet by mouth daily.    Historical Provider, MD  levothyroxine (SYNTHROID, LEVOTHROID) 100 MCG tablet Take 100 mcg by mouth daily before breakfast.    Historical Provider, MD  meloxicam (MOBIC) 7.5 MG tablet Take 1-2 tabs daily for 5 days, then 1 tab daily as needed for pain 06/15/15   Junius Finner, PA-C  predniSONE (DELTASONE) 10 MG tablet Take 3 tablets (30 mg total) by mouth daily. 02/08/15   Rodolph Bong, MD  predniSONE (DELTASONE) 20 MG tablet 3 tabs po day one, then 2 po daily x 4 days 06/15/15   Junius Finner, PA-C  venlafaxine XR  (EFFEXOR-XR) 75 MG 24 hr capsule Take 75 mg by mouth daily.    Historical Provider, MD   Meds Ordered and Administered this Visit  Medications - No data to display  BP 131/85 mmHg  Pulse 120  Temp(Src) 97.9 F (36.6 C) (Oral)  Ht  (1.676 m)  Wt 140 lb (63.504 kg)  BMI 22.61 kg/m2  SpO2 97% No data found.   Physical Exam  Constitutional: She is oriented to person, place, and time. She appears well-developed and well-nourished.  HENT:  Head: Normocephalic and atraumatic.  Eyes: EOM are normal.  Neck: Normal range of motion.  Cardiovascular: Tachycardia present.   Pulmonary/Chest: Effort normal.  Musculoskeletal: Normal range of motion. She exhibits tenderness. She exhibits no edema.  No midline spinal tenderness. Mild tenderness to bilateral lower lumbar muscles. Full ROM upper and lower extremities bilaterally. Negative straight leg raise bilaterally   Neurological: She is alert and oriented to person, place, and time.  Normal sensation in all extremities. Patella reflex 2+ bilaterally. Normal gait.  Skin: Skin is warm and dry. No rash noted. No erythema.  Psychiatric: She has a normal mood and affect. Her behavior is normal.  Nursing note and vitals reviewed.   ED Course  Procedures (including critical care time)  Labs Review Labs Reviewed - No data to display  Imaging Review No results found.    MDM   1. Low back strain, initial encounter     Pt c/o lower back pain. No red flag symptoms. Pt is afebrile. Tachycardic at 120 but hx of same last two visits over the since 2015.  Hx of Grave's disease and hyperthyroidism. Denies chest pain or SOB. On exam: mild tenderness to lumbar muscles. No midline spinal tenderness. No indication for imaging at this time. Rx: norco, prednisone for 5 days, flexeril and meloxicam.  Patient counseled on use of narcotic pain medications. Counseled not to combine these medications with others containing tylenol. Urged not to drink  alcohol, drive, or perform any other activities that requires focus while taking these medications.  Pt education packet with back exercises provided. F/u with Sports Medicine, Dr. Denyse Amass or Dr. Benjamin Stain, in 1-2 weeks if not improving, sooner if worsening. Patient verbalized understanding and agreement with treatment plan.    Junius Finner, PA-C 06/15/15 1429

## 2015-06-15 NOTE — ED Notes (Signed)
Low back pain x 3 days, denies trauma

## 2015-06-15 NOTE — Discharge Instructions (Signed)
Norco/Vicodin (hydrocodone-acetaminophen) is a narcotic pain medication, do not combine these medications with others containing tylenol. While taking, do not drink alcohol, drive, or perform any other activities that requires focus while taking these medications.  ° °Flexeril is a muscle relaxer and may cause drowsiness. Do not drink alcohol, drive, or operate heavy machinery while taking. ° °Meloxicam (Mobic) is an antiinflammatory to help with pain and inflammation.  Do not take ibuprofen, Advil, Aleve, or any other medications that contain NSAIDs while taking meloxicam as this may cause stomach upset or even ulcers if taken in large amounts for an extended period of time.  ° ° °

## 2015-06-19 ENCOUNTER — Telehealth: Payer: Self-pay | Admitting: Emergency Medicine

## 2023-05-25 LAB — COLOGUARD: COLOGUARD: NEGATIVE

## 2023-07-29 ENCOUNTER — Other Ambulatory Visit: Payer: Self-pay | Admitting: Obstetrics and Gynecology

## 2023-07-29 DIAGNOSIS — R928 Other abnormal and inconclusive findings on diagnostic imaging of breast: Secondary | ICD-10-CM

## 2023-08-15 ENCOUNTER — Other Ambulatory Visit: Payer: Self-pay | Admitting: Obstetrics and Gynecology

## 2023-08-15 ENCOUNTER — Ambulatory Visit
Admission: RE | Admit: 2023-08-15 | Discharge: 2023-08-15 | Disposition: A | Discharge status: Home / Self Care | Payer: No Typology Code available for payment source | Source: Ambulatory Visit | Attending: Obstetrics and Gynecology | Admitting: Obstetrics and Gynecology

## 2023-08-15 DIAGNOSIS — R928 Other abnormal and inconclusive findings on diagnostic imaging of breast: Secondary | ICD-10-CM

## 2023-08-15 DIAGNOSIS — N63 Unspecified lump in unspecified breast: Secondary | ICD-10-CM

## 2023-08-15 DIAGNOSIS — R599 Enlarged lymph nodes, unspecified: Secondary | ICD-10-CM

## 2023-08-20 ENCOUNTER — Ambulatory Visit
Admission: RE | Admit: 2023-08-20 | Discharge: 2023-08-20 | Disposition: A | Discharge status: Home / Self Care | Payer: No Typology Code available for payment source | Source: Ambulatory Visit | Attending: Obstetrics and Gynecology | Admitting: Obstetrics and Gynecology

## 2023-08-20 DIAGNOSIS — N63 Unspecified lump in unspecified breast: Secondary | ICD-10-CM

## 2023-08-20 DIAGNOSIS — R599 Enlarged lymph nodes, unspecified: Secondary | ICD-10-CM

## 2023-08-20 HISTORY — PX: BREAST BIOPSY: SHX20

## 2023-08-23 ENCOUNTER — Telehealth: Payer: Self-pay | Admitting: *Deleted

## 2023-08-23 LAB — SURGICAL PATHOLOGY

## 2023-08-23 NOTE — Telephone Encounter (Signed)
 Spoke to patient to confirm upcoming afternoon [facility] clinic appointment on 1/8, paperwork will be sent via email  Gave location and time, also informed patient that the surgeon's office would be calling as well to get information from them similar to the packet that they will be receiving so make sure to do both.  Reminded patient that all providers will be coming to the clinic to see them HERE and if they had any questions to not hesitate to reach back out to myself or their navigators.

## 2023-08-26 ENCOUNTER — Encounter: Payer: Self-pay | Admitting: *Deleted

## 2023-08-26 DIAGNOSIS — C50312 Malignant neoplasm of lower-inner quadrant of left female breast: Secondary | ICD-10-CM

## 2023-08-26 DIAGNOSIS — Z170 Estrogen receptor positive status [ER+]: Secondary | ICD-10-CM | POA: Insufficient documentation

## 2023-08-26 NOTE — Progress Notes (Signed)
 Radiation Oncology         [phone] ________________________________  Name: Alicia Roach        MRN: [id number]  Date of Service: 08/28/2023 DOB: 08/31/1975  RR:Djryd, Norberta Duncans, PA-C  Ebbie Cough, MD     REFERRING PHYSICIAN: Ebbie Cough, MD   DIAGNOSIS: The encounter diagnosis was Malignant neoplasm of lower-inner quadrant of left breast in female, estrogen receptor positive (HCC).   HISTORY OF PRESENT ILLNESS: Alicia Roach is a 48 y.o. female seen in the multidisciplinary breast clinic for a new diagnosis of left breast cancer. The patient was noted to have a screening detected distortion in the left breast. She returned for diagnostic work up on 08/15/23 that showed a mass by ultrasound measuring 1.2 cm in the 7:00 position, and multiple prominent left axillary nodes. 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 The cancer was ER/PR positive, HER2 was negative, with a Ki 67 of 5%. Her axilla was also biopsied and was non diagnostic. She's seen today to discuss treatment recommendations of her cancer.     PREVIOUS RADIATION THERAPY: No   PAST MEDICAL HISTORY:  Past Medical History:  Diagnosis Date   GAD (generalized anxiety disorder)    Dr. Elodie McKinney--sees her regularly   Hypothyroidism    s/p radioactive iodine ablation 2010    Retropharyngeal abscess 1997       PAST SURGICAL HISTORY: Past Surgical History:  Procedure Laterality Date   BREAST BIOPSY Left 08/20/2023   US  LT BREAST BX W LOC DEV 1ST LESION IMG BX SPEC US  GUIDE 08/20/2023 [facility] MAMMOGRAPHY   CESAREAN SECTION  10/11/2011   Procedure: CESAREAN SECTION;  Surgeon: Charlie CHRISTELLA Croak, MD;  Location: [facility];  Service: Gynecology;  Laterality: N/A;   digital reconstructive surgery (middle finger of L hand)     TONSILLECTOMY  1999   WISDOM TOOTH EXTRACTION       FAMILY HISTORY:  Family History  Problem Relation Age of  Onset   Hyperthyroidism Mother    Depression Mother    Depression Sister      SOCIAL HISTORY:  reports that she quit smoking about 22 years ago. Her smoking use included cigarettes. She started smoking about 30 years ago. She has never used smokeless tobacco. She reports current alcohol use. She reports that she does not use drugs. The patient lives in [place]. She has an 31 year old son who is very active in soccer. She's originally from [place].    ALLERGIES: Methimazole   MEDICATIONS:  Current Outpatient Medications  Medication Sig Dispense Refill   cyclobenzaprine  (FLEXERIL ) 10 MG tablet Take 1 tablet (10 mg total) by mouth 2 (two) times daily as needed for muscle spasms. 20 tablet 0   HYDROcodone -acetaminophen  (NORCO/VICODIN) 5-325 MG per tablet Take 1-2 tablets by mouth 3 (three) times daily as needed for moderate pain. 20 tablet 0   HYDROcodone -acetaminophen  (NORCO/VICODIN) 5-325 MG tablet Take 1 tablet by mouth every 6 (six) hours as needed for moderate pain or severe pain. 10 tablet 0   ibuprofen  (ADVIL ,MOTRIN ) 200 MG tablet Take 200 mg by mouth every 6 (six) hours as needed.     ipratropium (ATROVENT ) 0.06 % nasal spray Place 2 sprays into both nostrils 4 (four) times daily. 15 mL 1   levonorgestrel-ethinyl estradiol  (SEASONALE,INTROVALE,JOLESSA) 0.15-0.03 MG tablet Take 1 tablet by mouth daily.     levothyroxine  (SYNTHROID , LEVOTHROID) 100 MCG tablet Take 100 mcg  by mouth daily before breakfast.     meloxicam  (MOBIC ) 7.5 MG tablet Take 1-2 tabs daily for 5 days, then 1 tab daily as needed for pain 30 tablet 0   predniSONE  (DELTASONE ) 10 MG tablet Take 3 tablets (30 mg total) by mouth daily. 15 tablet 0   predniSONE  (DELTASONE ) 20 MG tablet 3 tabs po day one, then 2 po daily x 4 days 11 tablet 0   venlafaxine XR (EFFEXOR-XR) 75 MG 24 hr capsule Take 75 mg by mouth daily.     No current facility-administered medications for this visit.     REVIEW OF SYSTEMS: On  review of systems, the patient reports that she is doing well and feels more reassured by the information she's received today.      PHYSICAL EXAM:  Wt Readings from Last 3 Encounters:  08/28/23 153 lb 12.8 oz (69.8 kg)  06/15/15 140 lb (63.5 kg)  02/08/15 135 lb (61.2 kg)   Temp Readings from Last 3 Encounters:  08/28/23 97.6 F (36.4 C) (Temporal)  06/15/15 97.9 F (36.6 C) (Oral)  02/08/15 99.3 F (37.4 C) (Oral)   BP Readings from Last 3 Encounters:  08/28/23 133/85  06/15/15 131/85  02/08/15 121/81   Pulse Readings from Last 3 Encounters:  08/28/23 (!) 109  06/15/15 120  02/08/15 118    In general this is a well appearing caucasian female in no acute distress. She's alert and oriented x4 and appropriate throughout the examination. Cardiopulmonary assessment is negative for acute distress and she exhibits normal effort. Bilateral breast exam is deferred.    ECOG = 1  0 - Asymptomatic (Fully active, able to carry on all predisease activities without restriction)  1 - Symptomatic but completely ambulatory (Restricted in physically strenuous activity but ambulatory and able to carry out work of a light or sedentary nature. For example, light housework, office work)  2 - Symptomatic, <50% in bed during the day (Ambulatory and capable of all self care but unable to carry out any work activities. Up and about more than 50% of waking hours)  3 - Symptomatic, >50% in bed, but not bedbound (Capable of only limited self-care, confined to bed or chair 50% or more of waking hours)  4 - Bedbound (Completely disabled. Cannot carry on any self-care. Totally confined to bed or chair)  5 - Death   Raylene MM, Creech RH, Tormey DC, et al. [phone]). Toxicity and response criteria of the [facility]. Am. DOROTHA Bridges. Oncol. 5 (6): 649-55    LABORATORY DATA:  Lab Results  Component Value Date   WBC 9.8 08/28/2023   HGB 15.1 (H) 08/28/2023   HCT 44.6 08/28/2023    MCV 91.8 08/28/2023   PLT 266 08/28/2023   Lab Results  Component Value Date   NA 141 08/28/2023   K 4.2 08/28/2023   CL 106 08/28/2023   CO2 27 08/28/2023   Lab Results  Component Value Date   ALT 7 08/28/2023   AST 14 (L) 08/28/2023   ALKPHOS 63 08/28/2023   BILITOT 0.3 08/28/2023      RADIOGRAPHY: US  LT BREAST BX W LOC DEV 1ST LESION IMG BX SPEC US  GUIDE Addendum Date: 08/25/2023 ADDENDUM REPORT: 08/25/2023 00:05 ADDENDUM: Pathology revealed GRADE II INVASIVE DUCTAL CARCINOMA, DUCTAL CARCINOMA IN SITU of the LEFT breast, 7 o'clock, (coil clip). This was found to be concordant by Dr. Bard Moats. Pathology revealed FIBROADIPOSE TISSUE, NO LYMPHOID TISSUE IDENTIFIED of the LEFT axilla, (spiral clip).  This was found to be non-diagnostic by Dr. Dirk Arrant, given absence of lymphoid tissue. Pathology results were discussed with the patient by telephone. The patient reported doing well after the biopsies with tenderness at the sites. Post biopsy instructions and care were reviewed and questions were answered. The patient was encouraged to call [facility] for any additional concerns. My direct phone number was provided. The patient was referred to [facility] at [facility] [place] on August 27, 2022. Recommendation for a bilateral breast MRI given breast density-C. NOTE: A repeat biopsy of a LEFT axillary lymph node could be performed if clinically desired. Pathology results reported by Hendricks Benders, RN on 08/23/2023. Electronically Signed   By: Bard Moats M.D.   On: 08/25/2023 00:05   Result Date: 08/25/2023 CLINICAL DATA:  Indeterminate left breast mass and cortically thickened left axillary lymph node EXAM: ULTRASOUND GUIDED LEFT BREAST CORE NEEDLE BIOPSY COMPARISON:  Previous exam(s). PROCEDURE: I met with the patient and we discussed the procedure of ultrasound-guided biopsy, including benefits and  alternatives. We discussed the high likelihood of a successful procedure. We discussed the risks of the procedure, including infection, bleeding, tissue injury, clip migration, and inadequate sampling. Informed written consent was given. The usual time-out protocol was performed immediately prior to the procedure. Site 1: Left breast mass 7 o'clock position Lesion quadrant: Lower inner quadrant Using sterile technique and 1% Lidocaine  as local anesthetic, under direct ultrasound visualization, a 14 gauge spring-loaded device was used to perform biopsy of left breast mass 7 o'clock position using a medial approach. At the conclusion of the procedure coil shaped tissue marker clip was deployed into the biopsy cavity. Follow up 2 view mammogram was performed and dictated separately. Site 2: Left axillary lymph node Lesion quadrant: Upper outer quadrant Using sterile technique and 1% Lidocaine  as local anesthetic, under direct ultrasound visualization, a 14 gauge spring-loaded device was used to perform biopsy of left axillary lymph node using a lateral approach. At the conclusion of the procedure spiral shaped tissue marker clip was deployed into the biopsy cavity. Follow up 2 view mammogram was performed and dictated separately. IMPRESSION: Ultrasound guided biopsy of left breast mass 7 o'clock position and left axillary lymph node. No apparent complications. Electronically Signed: By: Bard Moats M.D. On: 08/20/2023 11:29   US  AXILLARY NODE CORE BIOPSY LEFT Addendum Date: 08/25/2023 ADDENDUM REPORT: 08/25/2023 00:05 ADDENDUM: Pathology revealed GRADE II INVASIVE DUCTAL CARCINOMA, DUCTAL CARCINOMA IN SITU of the LEFT breast, 7 o'clock, (coil clip). This was found to be concordant by Dr. Bard Moats. Pathology revealed FIBROADIPOSE TISSUE, NO LYMPHOID TISSUE IDENTIFIED of the LEFT axilla, (spiral clip). This was found to be non-diagnostic by Dr. Dirk Arrant, given absence of lymphoid tissue. Pathology results were  discussed with the patient by telephone. The patient reported doing well after the biopsies with tenderness at the sites. Post biopsy instructions and care were reviewed and questions were answered. The patient was encouraged to call [facility] for any additional concerns. My direct phone number was provided. The patient was referred to [facility] at [facility] on August 27, 2022. Recommendation for a bilateral breast MRI given breast density-C. NOTE: A repeat biopsy of a LEFT axillary lymph node could be performed if clinically desired. Pathology results reported by Hendricks Benders, RN on 08/23/2023. Electronically Signed   By: Bard Moats HERO.D.  On: 08/25/2023 00:05   Result Date: 08/25/2023 CLINICAL DATA:  Indeterminate left breast mass and cortically thickened left axillary lymph node EXAM: ULTRASOUND GUIDED LEFT BREAST CORE NEEDLE BIOPSY COMPARISON:  Previous exam(s). PROCEDURE: I met with the patient and we discussed the procedure of ultrasound-guided biopsy, including benefits and alternatives. We discussed the high likelihood of a successful procedure. We discussed the risks of the procedure, including infection, bleeding, tissue injury, clip migration, and inadequate sampling. Informed written consent was given. The usual time-out protocol was performed immediately prior to the procedure. Site 1: Left breast mass 7 o'clock position Lesion quadrant: Lower inner quadrant Using sterile technique and 1% Lidocaine  as local anesthetic, under direct ultrasound visualization, a 14 gauge spring-loaded device was used to perform biopsy of left breast mass 7 o'clock position using a medial approach. At the conclusion of the procedure coil shaped tissue marker clip was deployed into the biopsy cavity. Follow up 2 view mammogram was performed and dictated separately. Site 2: Left axillary lymph node Lesion quadrant: Upper outer  quadrant Using sterile technique and 1% Lidocaine  as local anesthetic, under direct ultrasound visualization, a 14 gauge spring-loaded device was used to perform biopsy of left axillary lymph node using a lateral approach. At the conclusion of the procedure spiral shaped tissue marker clip was deployed into the biopsy cavity. Follow up 2 view mammogram was performed and dictated separately. IMPRESSION: Ultrasound guided biopsy of left breast mass 7 o'clock position and left axillary lymph node. No apparent complications. Electronically Signed: By: Bard Moats M.D. On: 08/20/2023 11:29   MM CLIP PLACEMENT LEFT Result Date: 08/20/2023 CLINICAL DATA:  Status post ultrasound biopsy left breast mass and left axillary lymph node EXAM: 3D DIAGNOSTIC LEFT MAMMOGRAM POST ULTRASOUND BIOPSY COMPARISON:  Previous exam(s). FINDINGS: 3D Mammographic images were obtained following ultrasound guided biopsy of left breast mass and left axillary lymph node. Site 1: Left breast mass 7 o'clock position: Coil clip: In appropriate position. Site 2: Left axillary lymph node: HydroMARK clip: In appropriate position IMPRESSION: Appropriate positioning of the biopsy marking clips as above. Final Assessment: Post Procedure Mammograms for Marker Placement Electronically Signed   By: Bard Moats M.D.   On: 08/20/2023 11:27   MM 3D DIAGNOSTIC MAMMOGRAM UNILATERAL LEFT BREAST Result Date: 08/15/2023 CLINICAL DATA:  Screening recall for possible left breast distortion EXAM: DIGITAL DIAGNOSTIC UNILATERAL LEFT MAMMOGRAM WITH TOMOSYNTHESIS AND CAD; ULTRASOUND LEFT BREAST LIMITED TECHNIQUE: Left digital diagnostic mammography and breast tomosynthesis was performed. The images were evaluated with computer-aided detection. ; Targeted ultrasound examination of the left breast was performed. COMPARISON:  Previous exam(s). ACR Breast Density Category c: The breasts are heterogeneously dense, which may obscure small masses. FINDINGS: MAMMOGRAM: Spot  tomosynthesis views demonstrate a persistent area of architectural distortion in the lower inner left breast anterior depth (spot CC image 47/66, ML image 26/62). This corresponds with the screening mammogram finding. No additional suspicious findings are identified in the left breast. ULTRASOUND: On physical exam, there is a firm immobile mass in the lower inner left breast. Targeted left breast ultrasound was performed by the sonographer and the physician. At 7 o'clock 3 cm from the nipple, there is an irregular hypoechoic mass with moderate internal vascularity. It measures 1.2 x 1.0 x 1.2 cm. This corresponds with the distortion seen on mammogram. Targeted left axillary ultrasound demonstrates multiple prominent lymph nodes with borderline thickening. The largest lymph node measures up to 1.6 cm in length and 3 mm in cortical thickening. IMPRESSION: 1. Left  breast 1.2 cm irregular hypoechoic mass in the 7 o'clock position is highly concerning for malignancy. Recommend further assessment ultrasound-guided biopsy. 2. Multiple prominent left axillary lymph nodes are indeterminate. Recommend further assessment with ultrasound-guided biopsy of one of the most suspicious appearing lymph nodes at the time of the procedure. RECOMMENDATION: 1. Left breast ultrasound-guided biopsy (1 site). 2. Left axillary ultrasound-guided lymph node biopsy (1 site). I have discussed the findings and recommendations with the patient. The biopsy procedure was discussed with the patient and questions were answered. Patient expressed their understanding of the biopsy recommendation. The patient is scheduled for biopsy August 20, 2023. Ordering provider will be notified. If applicable, a reminder letter will be sent to the patient regarding the next appointment. BI-RADS CATEGORY  5: Highly suggestive of malignancy. Electronically Signed   By: Dirk Arrant M.D.   On: 08/15/2023 11:58   US  LIMITED ULTRASOUND INCLUDING AXILLA LEFT  BREAST  Result Date: 08/15/2023 CLINICAL DATA:  Screening recall for possible left breast distortion EXAM: DIGITAL DIAGNOSTIC UNILATERAL LEFT MAMMOGRAM WITH TOMOSYNTHESIS AND CAD; ULTRASOUND LEFT BREAST LIMITED TECHNIQUE: Left digital diagnostic mammography and breast tomosynthesis was performed. The images were evaluated with computer-aided detection. ; Targeted ultrasound examination of the left breast was performed. COMPARISON:  Previous exam(s). ACR Breast Density Category c: The breasts are heterogeneously dense, which may obscure small masses. FINDINGS: MAMMOGRAM: Spot tomosynthesis views demonstrate a persistent area of architectural distortion in the lower inner left breast anterior depth (spot CC image 47/66, ML image 26/62). This corresponds with the screening mammogram finding. No additional suspicious findings are identified in the left breast. ULTRASOUND: On physical exam, there is a firm immobile mass in the lower inner left breast. Targeted left breast ultrasound was performed by the sonographer and the physician. At 7 o'clock 3 cm from the nipple, there is an irregular hypoechoic mass with moderate internal vascularity. It measures 1.2 x 1.0 x 1.2 cm. This corresponds with the distortion seen on mammogram. Targeted left axillary ultrasound demonstrates multiple prominent lymph nodes with borderline thickening. The largest lymph node measures up to 1.6 cm in length and 3 mm in cortical thickening. IMPRESSION: 1. Left breast 1.2 cm irregular hypoechoic mass in the 7 o'clock position is highly concerning for malignancy. Recommend further assessment ultrasound-guided biopsy. 2. Multiple prominent left axillary lymph nodes are indeterminate. Recommend further assessment with ultrasound-guided biopsy of one of the most suspicious appearing lymph nodes at the time of the procedure. RECOMMENDATION: 1. Left breast ultrasound-guided biopsy (1 site). 2. Left axillary ultrasound-guided lymph node biopsy (1  site). I have discussed the findings and recommendations with the patient. The biopsy procedure was discussed with the patient and questions were answered. Patient expressed their understanding of the biopsy recommendation. The patient is scheduled for biopsy August 20, 2023. Ordering provider will be notified. If applicable, a reminder letter will be sent to the patient regarding the next appointment. BI-RADS CATEGORY  5: Highly suggestive of malignancy. Electronically Signed   By: Dirk Arrant M.D.   On: 08/15/2023 11:58       IMPRESSION/PLAN: 1. Stage IA, [id number], grade 2, ER/PR positive invasive ductal carcinoma of the left breast. Dr. Dewey discusses the pathology findings and reviews the nature of early stage breast disease. The consensus from the breast conference includes breast conservation with lumpectomy with targeted node biopsy. Dr. Gudena anticipates Oncotype Dx score to determine a role for systemic therapy. Provided that chemotherapy is not indicated, the patient's course would then be followed by  external radiotherapy to the breast  to reduce risks of local recurrence. Dr. Odean anticipates adjuvant antiestrogen therapy to follow. We discussed the risks, benefits, short, and long term effects of radiotherapy, as well as the curative intent, and the patient is interested in proceeding. Dr. Dewey discusses the delivery and logistics of radiotherapy and anticipates a course of 4 weeks of radiotherapy to the left breast with deep inspiration breath hold technique. We have clarified this based on her insurance coverage. We will see her back a few weeks after surgery to discuss the simulation process and anticipate we starting radiotherapy about 4-6 weeks after surgery.  2. Possible genetic predisposition to malignancy. The patient is a candidate for genetic testing given her personal  history. She will meet with our geneticist today in clinic. 3.  Contraceptive Counseling. The patient's  partner has had a vasectomy so no pregnancy testing is planned.    In a visit lasting 60 minutes, greater than 50% of the time was spent face to face reviewing her case, as well as in preparation of, discussing, and coordinating the patient's care.  The above documentation reflects my direct findings during this shared patient visit. Please see the separate note by Dr. Dewey on this date for the remainder of the patient's plan of care.    Donald KYM Husband, [facility]    **Disclaimer: This note was dictated with voice recognition software. Similar sounding words can inadvertently be transcribed and this note may contain transcription errors which may not have been corrected upon publication of note.**

## 2023-08-28 ENCOUNTER — Encounter: Payer: Self-pay | Admitting: Physical Therapy

## 2023-08-28 ENCOUNTER — Other Ambulatory Visit: Payer: Self-pay | Admitting: General Surgery

## 2023-08-28 ENCOUNTER — Ambulatory Visit
Admission: RE | Admit: 2023-08-28 | Discharge: 2023-08-28 | Disposition: A | Discharge status: Home / Self Care | Payer: No Typology Code available for payment source | Source: Ambulatory Visit | Attending: Radiation Oncology | Admitting: Radiation Oncology

## 2023-08-28 ENCOUNTER — Ambulatory Visit: Payer: No Typology Code available for payment source | Attending: General Surgery | Admitting: Physical Therapy

## 2023-08-28 ENCOUNTER — Inpatient Hospital Stay: Payer: No Typology Code available for payment source

## 2023-08-28 ENCOUNTER — Other Ambulatory Visit: Payer: Self-pay

## 2023-08-28 ENCOUNTER — Encounter: Payer: Self-pay | Admitting: General Practice

## 2023-08-28 ENCOUNTER — Inpatient Hospital Stay (HOSPITAL_BASED_OUTPATIENT_CLINIC_OR_DEPARTMENT_OTHER): Payer: No Typology Code available for payment source | Admitting: Genetic Counselor

## 2023-08-28 ENCOUNTER — Inpatient Hospital Stay
Payer: No Typology Code available for payment source | Attending: Hematology and Oncology | Admitting: Hematology and Oncology

## 2023-08-28 ENCOUNTER — Encounter: Payer: Self-pay | Admitting: *Deleted

## 2023-08-28 VITALS — BP 133/85 | HR 109 | Temp 97.6°F | Resp 18 | Ht 66.0 in | Wt 153.8 lb

## 2023-08-28 DIAGNOSIS — R293 Abnormal posture: Secondary | ICD-10-CM | POA: Diagnosis present

## 2023-08-28 DIAGNOSIS — C50212 Malignant neoplasm of upper-inner quadrant of left female breast: Secondary | ICD-10-CM | POA: Insufficient documentation

## 2023-08-28 DIAGNOSIS — Z8042 Family history of malignant neoplasm of prostate: Secondary | ICD-10-CM

## 2023-08-28 DIAGNOSIS — Z8481 Family history of carrier of genetic disease: Secondary | ICD-10-CM

## 2023-08-28 DIAGNOSIS — C50312 Malignant neoplasm of lower-inner quadrant of left female breast: Secondary | ICD-10-CM | POA: Insufficient documentation

## 2023-08-28 DIAGNOSIS — Z170 Estrogen receptor positive status [ER+]: Secondary | ICD-10-CM

## 2023-08-28 DIAGNOSIS — Z8041 Family history of malignant neoplasm of ovary: Secondary | ICD-10-CM

## 2023-08-28 DIAGNOSIS — Z87891 Personal history of nicotine dependence: Secondary | ICD-10-CM | POA: Insufficient documentation

## 2023-08-28 DIAGNOSIS — Z800 Family history of malignant neoplasm of digestive organs: Secondary | ICD-10-CM

## 2023-08-28 LAB — CMP (CANCER CENTER ONLY)
ALT: 7 U/L (ref 0–44)
AST: 14 U/L — ABNORMAL LOW (ref 15–41)
Albumin: 4.2 g/dL (ref 3.5–5.0)
Alkaline Phosphatase: 63 U/L (ref 38–126)
Anion gap: 8 (ref 5–15)
BUN: 22 mg/dL — ABNORMAL HIGH (ref 6–20)
CO2: 27 mmol/L (ref 22–32)
Calcium: 9.6 mg/dL (ref 8.9–10.3)
Chloride: 106 mmol/L (ref 98–111)
Creatinine: 0.96 mg/dL (ref 0.44–1.00)
GFR, Estimated: >60 mL/min (ref >60)
Glucose, Bld: 75 mg/dL (ref 70–99)
Potassium: 4.2 mmol/L (ref 3.5–5.1)
Sodium: 141 mmol/L (ref 135–145)
Total Bilirubin: 0.3 mg/dL (ref 0.0–1.2)
Total Protein: 7.4 g/dL (ref 6.5–8.1)

## 2023-08-28 LAB — GENETIC SCREENING ORDER

## 2023-08-28 LAB — CBC WITH DIFFERENTIAL (CANCER CENTER ONLY)
Abs Immature Granulocytes: 0.02 10*3/uL (ref 0.00–0.07)
Basophils Absolute: 0.1 10*3/uL (ref 0.0–0.1)
Basophils Relative: 1 %
Eosinophils Absolute: 0.4 10*3/uL (ref 0.0–0.5)
Eosinophils Relative: 5 %
HCT: 44.6 % (ref 36.0–46.0)
Hemoglobin: 15.1 g/dL — ABNORMAL HIGH (ref 12.0–15.0)
Immature Granulocytes: 0 %
Lymphocytes Relative: 26 %
Lymphs Abs: 2.6 10*3/uL (ref 0.7–4.0)
MCH: 31.1 pg (ref 26.0–34.0)
MCHC: 33.9 g/dL (ref 30.0–36.0)
MCV: 91.8 fL (ref 80.0–100.0)
Monocytes Absolute: 0.4 10*3/uL (ref 0.1–1.0)
Monocytes Relative: 5 %
Neutro Abs: 6.3 10*3/uL (ref 1.7–7.7)
Neutrophils Relative %: 63 %
Platelet Count: 266 10*3/uL (ref 150–400)
RBC: 4.86 MIL/uL (ref 3.87–5.11)
RDW: 11.9 % (ref 11.5–15.5)
WBC Count: 9.8 10*3/uL (ref 4.0–10.5)
nRBC: 0 % (ref 0.0–0.2)

## 2023-08-28 NOTE — Assessment & Plan Note (Signed)
 08/20/2023:Screening mammogram detected left breast distortion at 7 o'clock position measuring 1.2 cm.  Extra lymph node biopsy benign, biopsy of the breast: Grade 2 IDC ER 90%, PR 90%, HER2 negative by FISH, Ki-67 5%  Pathology and radiology counseling:Discussed with the patient, the details of pathology including the type of breast cancer,the clinical staging, the significance of ER, PR and HER-2/neu receptors and the implications for treatment. After reviewing the pathology in detail, we proceeded to discuss the different treatment options between surgery, radiation, chemotherapy, antiestrogen therapies.  Recommendations: 1. Breast conserving surgery followed by 2. Oncotype DX testing to determine if chemotherapy would be of any benefit followed by 3. Adjuvant radiation therapy followed by 4. Adjuvant antiestrogen therapy  Genetic testing will be done Final surgical plan based on genetic test results  Oncotype counseling: I discussed Oncotype DX test. I explained to the patient that this is a 21 gene panel to evaluate patient tumors DNA to calculate recurrence score. This would help determine whether patient has high risk or low risk breast cancer. She understands that if her tumor was found to be high risk, she would benefit from systemic chemotherapy. If low risk, no need of chemotherapy.  Return to clinic after surgery to discuss final pathology report and then determine if Oncotype DX testing will need to be sent.  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

## 2023-08-28 NOTE — Progress Notes (Signed)
 [facility] Multidisciplinary Clinic Spiritual Care Note  Met with Alicia Roach and her SO in Breast Multidisciplinary Clinic to introduce Support Center team/resources.  she completed SDOH screening; results follow below.    SDOH Screenings   Food Insecurity: No Food Insecurity (08/28/2023)  Housing: Low Risk  (08/28/2023)  Transportation Needs: No Transportation Needs (08/28/2023)  Utilities: Not At Risk (08/28/2023)  Depression (PHQ2-9): Low Risk  (08/28/2023)  Financial Resource Strain: Low Risk  (06/13/2021)   Received from [facility] visits prior to 10/20/2022.  Physical Activity: Sufficiently Active (06/13/2021)   Received from [facility] visits prior to 10/20/2022.  Social Connections: Unknown (01/01/2022)   Received from [facility]  Tobacco Use: Medium Risk (08/28/2023)   Chaplain and patient discussed common feelings and emotions when being diagnosed with cancer, and the importance of support during treatment.  Chaplain informed patient of the support team and support services at [facility].  Chaplain provided contact information and encouraged patient to call with any questions or concerns.  [name] reports feeling relieved now that she has met her team and learned about the scope of her diagnosis and anticipated treatment plan. We talked at length about how to talk with her 11yo son about her diagnosis and treatment.  Follow up needed: Yes.  We plan to follow up by phone in ca 2 weeks and to revisit the possibility of an Sports Coach at that time.   [address] Olam Corrigan, [place], [facility] Pager [phone] Voicemail [phone]

## 2023-08-28 NOTE — Progress Notes (Signed)
 [facility] CONSULT NOTE  Patient Care Team: Fabio Norberta Duncans, PA-C as PCP - General (Family Medicine) Tyree Nanetta SAILOR, RN as Oncology Nurse Navigator Glean Stephane BROCKS, RN as Oncology Nurse Navigator Ebbie Cough, MD as Consulting Physician (General Surgery) Odean Potts, MD as Consulting Physician (Hematology and Oncology) Dewey Rush, MD as Consulting Physician (Radiation Oncology)  CHIEF COMPLAINTS/PURPOSE OF CONSULTATION:  Newly diagnosed breast cancer  HISTORY OF PRESENTING ILLNESS: Ms. event is a 48 year old with screening mammogram detected left breast cancer.  She has had annual mammograms since 2021.  Current mammogram revealed 1.2 cm mass at 7 o'clock position, axilla ultrasound: Multiple borderline enlarged lymph nodes biopsy benign.  Biopsy of the mass revealed grade 2 IDC ER 90%, PR 90% and HER2 negative, Ki-67 5%.  Patient was presented this morning at the multidiscipline tumor board and she is here today with [facility] clinic to discuss treatment plan accompanied by her significant other.   I reviewed her records extensively and collaborated the history with the patient.  SUMMARY OF ONCOLOGIC HISTORY: Oncology History  Malignant neoplasm of lower-inner quadrant of left breast in female, estrogen receptor positive (HCC)  08/20/2023 Initial Diagnosis   Screening mammogram detected left breast distortion at 7 o'clock position measuring 1.2 cm.  Extra lymph node biopsy benign, biopsy of the breast: Grade 2 IDC ER 90%, PR 90%, HER2 negative by FISH, Ki-67 5%      MEDICAL HISTORY:  Past Medical History:  Diagnosis Date   GAD (generalized anxiety disorder)    Dr. Elodie McKinney--sees her regularly   Hypothyroidism    s/p radioactive iodine ablation 2010    Retropharyngeal abscess 1997    SURGICAL HISTORY: Past Surgical History:  Procedure Laterality Date   BREAST BIOPSY Left 08/20/2023   US  LT BREAST BX W LOC DEV 1ST LESION IMG BX SPEC US  GUIDE  08/20/2023 [facility] MAMMOGRAPHY   CESAREAN SECTION  10/11/2011   Procedure: CESAREAN SECTION;  Surgeon: Charlie CHRISTELLA Croak, MD;  Location: [facility];  Service: Gynecology;  Laterality: N/A;   digital reconstructive surgery (middle finger of L hand)     TONSILLECTOMY  1999   WISDOM TOOTH EXTRACTION      SOCIAL HISTORY: Social History   Socioeconomic History   Marital status: Legally Separated    Spouse name: Not on file   Number of children: Not on file   Years of education: Not on file   Highest education level: Not on file  Occupational History   Not on file  Tobacco Use   Smoking status: Former    Current packs/day: 0.00    Types: Cigarettes    Start date: 08/20/1993    Quit date: 08/20/2001    Years since quitting: 22.0   Smokeless tobacco: Never  Substance and Sexual Activity   Alcohol use: Yes   Drug use: No   Sexual activity: Yes    Birth control/protection: None  Other Topics Concern   Not on file  Social History Narrative   Married, 1 son 1/2 yr old.   Banker for [employer].   HS at [employer] in [place] but lived in [place], [place] prior.   Smoked some in HS/College.  Occasional alcohol.         Social Drivers of Health   Financial Resource Strain: Low Risk  (06/13/2021)   Received from [facility] visits prior to 10/20/2022.   Overall Financial Resource Strain (CARDIA)    Difficulty of Paying Living Expenses: Not  very hard  Food Insecurity: Low Risk  (06/24/2023)   Received from [facility]   Hunger Vital Sign    Worried About Running Out of Food in the Last Year: Never true    Ran Out of Food in the Last Year: Never true  Transportation Needs: No Transportation Needs (06/24/2023)   Received from [facility]    In the past 12 months, has lack of reliable transportation kept you from medical appointments, meetings, work or from getting things needed for daily living? : No  Physical Activity: Sufficiently Active (06/13/2021)   Received from  [facility] visits prior to 10/20/2022.   Exercise Vital Sign    Days of Exercise per Week: 5 days    Minutes of Exercise per Session: 50 min  Stress: Not on file (06/26/2023)  Social Connections: Unknown (01/01/2022)   Received from [facility]   Social Network    Social Network: Not on file  Intimate Partner Violence: Unknown (11/23/2021)   Received from [facility]   HITS    Physically Hurt: Not on file    Insult or Talk Down To: Not on file    Threaten Physical Harm: Not on file    Scream or Curse: Not on file    FAMILY HISTORY: Family History  Problem Relation Age of Onset   Hyperthyroidism Mother    Depression Mother    Depression Sister     ALLERGIES:  is allergic to methimazole.  MEDICATIONS:  Current Outpatient Medications  Medication Sig Dispense Refill   cyclobenzaprine  (FLEXERIL ) 10 MG tablet Take 1 tablet (10 mg total) by mouth 2 (two) times daily as needed for muscle spasms. 20 tablet 0   HYDROcodone -acetaminophen  (NORCO/VICODIN) 5-325 MG per tablet Take 1-2 tablets by mouth 3 (three) times daily as needed for moderate pain. 20 tablet 0   HYDROcodone -acetaminophen  (NORCO/VICODIN) 5-325 MG tablet Take 1 tablet by mouth every 6 (six) hours as needed for moderate pain or severe pain. 10 tablet 0   ibuprofen  (ADVIL ,MOTRIN ) 200 MG tablet Take 200 mg by mouth every 6 (six) hours as needed.     ipratropium (ATROVENT ) 0.06 % nasal spray Place 2 sprays into both nostrils 4 (four) times daily. 15 mL 1   levonorgestrel-ethinyl estradiol  (SEASONALE,INTROVALE,JOLESSA) 0.15-0.03 MG tablet Take 1 tablet by mouth daily.     levothyroxine  (SYNTHROID , LEVOTHROID) 100 MCG tablet Take 100 mcg by mouth daily before breakfast.     meloxicam  (MOBIC ) 7.5 MG tablet Take 1-2 tabs daily for 5 days, then 1 tab daily as needed for pain 30 tablet 0   predniSONE  (DELTASONE ) 10 MG tablet Take 3 tablets (30 mg total) by mouth daily. 15 tablet 0   predniSONE  (DELTASONE ) 20 MG  tablet 3 tabs po day one, then 2 po daily x 4 days 11 tablet 0   venlafaxine XR (EFFEXOR-XR) 75 MG 24 hr capsule Take 75 mg by mouth daily.     No current facility-administered medications for this visit.    REVIEW OF SYSTEMS:   Constitutional: Denies fevers, chills or abnormal night sweats Breast:  Denies any palpable lumps or discharge All other systems were reviewed with the patient and are negative.  PHYSICAL EXAMINATION: ECOG PERFORMANCE STATUS: 0 - Asymptomatic  Vitals:   08/28/23 1210  BP: 133/85  Pulse: (!) 109  Resp: 18  Temp: 97.6 F (36.4 C)  SpO2: 100%   Filed Weights   08/28/23 1210  Weight: 153 lb 12.8 oz (69.8  kg)    GENERAL:alert, no distress and comfortable    LABORATORY DATA:  I have reviewed the data as listed Lab Results  Component Value Date   WBC 9.8 08/28/2023   HGB 15.1 (H) 08/28/2023   HCT 44.6 08/28/2023   MCV 91.8 08/28/2023   PLT 266 08/28/2023   Lab Results  Component Value Date   NA 141 08/28/2023   K 4.2 08/28/2023   CL 106 08/28/2023   CO2 27 08/28/2023    RADIOGRAPHIC STUDIES: I have personally reviewed the radiological reports and agreed with the findings in the report.  ASSESSMENT AND PLAN:  Malignant neoplasm of lower-inner quadrant of left breast in female, estrogen receptor positive (HCC) 08/20/2023:Screening mammogram detected left breast distortion at 7 o'clock position measuring 1.2 cm.  Extra lymph node biopsy benign, biopsy of the breast: Grade 2 IDC ER 90%, PR 90%, HER2 negative by FISH, Ki-67 5%  Pathology and radiology counseling:Discussed with the patient, the details of pathology including the type of breast cancer,the clinical staging, the significance of ER, PR and HER-2/neu receptors and the implications for treatment. After reviewing the pathology in detail, we proceeded to discuss the different treatment options between surgery, radiation, chemotherapy, antiestrogen therapies.  Recommendations: 1. Breast  conserving surgery followed by 2. Oncotype DX testing to determine if chemotherapy would be of any benefit followed by 3. Adjuvant radiation therapy followed by 4. Adjuvant antiestrogen therapy  Genetic testing will be done Final surgical plan based on genetic test results  Oncotype counseling: I discussed Oncotype DX test. I explained to the patient that this is a 21 gene panel to evaluate patient tumors DNA to calculate recurrence score. This would help determine whether patient has high risk or low risk breast cancer. She understands that if her tumor was found to be high risk, she would benefit from systemic chemotherapy. If low risk, no need of chemotherapy.  Return to clinic after surgery to discuss final pathology report and then determine if Oncotype DX testing will need to be sent.  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   All questions were answered. The patient knows to call the clinic with any problems, questions or concerns.    Viinay K Doye Montilla, MD 08/28/23

## 2023-08-28 NOTE — Therapy (Signed)
 OUTPATIENT PHYSICAL THERAPY BREAST CANCER BASELINE EVALUATION   Patient Name: Alicia Roach MRN: [id number] DOB:1976/04/25, 48 y.o., female Today's Date: 08/28/2023  END OF SESSION:  PT End of Session - 08/28/23 1430     Visit Number 1    Number of Visits 2    Date for PT Re-Evaluation 10/23/23    PT Start Time 1352    PT Stop Time 1403   Also saw pt from 1431 to 1452 for a total of 32 minutes   PT Time Calculation (min) 11 min    Activity Tolerance Patient tolerated treatment well    Behavior During Therapy [facility] for tasks assessed/performed             Past Medical History:  Diagnosis Date   GAD (generalized anxiety disorder)    Dr. Elodie McKinney--sees her regularly   Hypothyroidism    s/p radioactive iodine ablation 2010    Retropharyngeal abscess 1997   Past Surgical History:  Procedure Laterality Date   BREAST BIOPSY Left 08/20/2023   US  LT BREAST BX W LOC DEV 1ST LESION IMG BX SPEC US  GUIDE 08/20/2023 [facility] MAMMOGRAPHY   CESAREAN SECTION  10/11/2011   Procedure: CESAREAN SECTION;  Surgeon: Charlie CHRISTELLA Croak, MD;  Location: [facility];  Service: Gynecology;  Laterality: N/A;   digital reconstructive surgery (middle finger of L hand)     TONSILLECTOMY  1999   WISDOM TOOTH EXTRACTION     Patient Active Problem List   Diagnosis Date Noted   Malignant neoplasm of lower-inner quadrant of left breast in female, estrogen receptor positive (HCC) 08/26/2023   Acute low back pain 02/28/2014   Pharyngitis, acute 07/02/2012   Viral URI 04/23/2012   SINUSITIS - ACUTE-NOS 06/16/2010   ADVERSE DRUG REACTION 10/18/2009   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 Donnice Bury  REFERRING DIAG: Left breast cancer  THERAPY DIAG:  Malignant neoplasm of upper-inner quadrant of left breast in female, estrogen receptor positive (HCC)  Abnormal posture  Rationale for Evaluation and Treatment: Rehabilitation  ONSET DATE:  07/23/2023  SUBJECTIVE:                                                                                                                                                                                           SUBJECTIVE STATEMENT: Patient reports she is here today to be seen by her medical team for her newly diagnosed left breast cancer.   PERTINENT HISTORY:  Patient was diagnosed on 07/23/2023 with left grade 2 invasive ductal carcinoma breast cancer. It measures 1.2 cm and is located in the lower inner  quadrant. It is ER/PR positive and HER2 negative with a Ki67 of 5%.   PATIENT GOALS:   reduce lymphedema risk and learn post op HEP.   PAIN:  Are you having pain? No  PRECAUTIONS: Active CA   RED FLAGS: None   HAND DOMINANCE: right  WEIGHT BEARING RESTRICTIONS: No  FALLS:  Has patient fallen in last 6 months? No  LIVING ENVIRONMENT: Patient lives with: her boyfriend and her 39 y.o. 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  COGNITION: Overall cognitive status: Within functional limits for tasks assessed    POSTURE:  Forward head and rounded shoulders posture  UPPER EXTREMITY AROM/PROM:  A/PROM RIGHT   eval   Shoulder extension 42  Shoulder flexion 160  Shoulder abduction 173  Shoulder internal rotation 68  Shoulder external rotation 90    (Blank rows = not tested)  A/PROM LEFT   eval  Shoulder extension 47  Shoulder flexion 153  Shoulder abduction 168  Shoulder internal rotation 67  Shoulder external rotation 85    (Blank rows = not tested)  CERVICAL AROM: All within normal limits   UPPER EXTREMITY STRENGTH: WNL  LYMPHEDEMA ASSESSMENTS (in cm):   LANDMARK RIGHT   eval  10  cm proximal to olecranon process 27  Olecranon process 23.7  10 cm proximal to ulnar styloid process 22.4  Just proximal to ulnar styloid process 16  Across hand at thumb web space 19.4  At base of 2nd digit 6  (Blank rows = not tested)  LANDMARK LEFT   eval  10 cm proximal to olecranon process 26.5  Olecranon process 23.7  10 cm proximal to ulnar styloid process 21  Just proximal to ulnar styloid process 15.6  Across hand at thumb web space 18.8  At base of 2nd digit 5.8  (Blank rows = not tested)  L-DEX LYMPHEDEMA SCREENING:  The patient was assessed using the L-Dex machine today to produce a lymphedema index baseline score. The patient will be reassessed on a regular basis (typically every 3 months) to obtain new L-Dex scores. If the score is > 6.5 points away from his/her baseline score indicating onset of subclinical lymphedema, it will be recommended to wear a compression garment for 4 weeks, 12 hours per day and then be reassessed. If the score continues to be > 6.5 points from baseline at reassessment, we will initiate lymphedema treatment. Assessing in this manner has a 95% rate of preventing clinically significant lymphedema.   L-DEX FLOWSHEETS - 08/28/23 1600       L-DEX LYMPHEDEMA SCREENING   Measurement Type Unilateral    L-DEX MEASUREMENT EXTREMITY Upper Extremity    POSITION  Standing    DOMINANT SIDE Right    At Risk Side Left    BASELINE SCORE (UNILATERAL) 0.8             QUICK DASH SURVEY:  Junie Palin - 08/28/23 0001     Open a tight or new jar No difficulty    Do heavy household chores (wash walls, wash floors) No difficulty    Carry a shopping bag or briefcase No difficulty    Wash your back No difficulty    Use a knife to cut food No difficulty  Recreational activities in which you take some force or impact through your arm, shoulder, or hand (golf, hammering, tennis) No difficulty    During the past week, to what extent has your arm, shoulder or  hand problem interfered with your normal social activities with family, friends, neighbors, or groups? Not at all    During the past week, to what extent has your arm, shoulder or hand problem limited your work or other regular daily activities Not at all    Arm, shoulder, or hand pain. None    Tingling (pins and needles) in your arm, shoulder, or hand None    Difficulty Sleeping No difficulty    DASH Score 0 %              PATIENT EDUCATION:  Education details: Lymphedema risk reduction and post op shoulder/posture HEP Person educated: Patient Education method: Explanation, Demonstration, Handout Education comprehension: Patient verbalized understanding and returned demonstration  HOME EXERCISE PROGRAM: Patient was instructed today in a home exercise program today for post op shoulder range of motion. These included active assist shoulder flexion in sitting, scapular retraction, wall walking with shoulder abduction, and hands behind head external rotation.  She was encouraged to do these twice a day, holding 3 seconds and repeating 5 times when permitted by her physician.   ASSESSMENT:  CLINICAL IMPRESSION: Patient was diagnosed on 07/23/2023 with left grade 2 invasive ductal carcinoma breast cancer. It measures 1.2 cm and is located in the lower inner quadrant. It is ER/PR positive and HER2 negative with a Ki67 of 5%. Her multidisciplinary medical team met prior to her assessments to determine a recommended treatment plan. She is planning to have a left lumpectomy and sentinel node biopsy (with 1 enlarged node targeted), Oncotype testing, radiation, and anti-estrogen therapy. She will benefit from a post op PT reassessment to determine needs and from L-Dex screens every 3 months for 2 years to detect subclinical lymphedema.  Pt will benefit from skilled therapeutic intervention to improve on the following deficits: Decreased knowledge of precautions, impaired UE functional use, pain,  decreased ROM, postural dysfunction.   PT treatment/interventions: ADL/self-care home management, pt/family education, therapeutic exercise  REHAB POTENTIAL: Excellent  CLINICAL DECISION MAKING: Stable/uncomplicated  EVALUATION COMPLEXITY: Low   GOALS: Goals reviewed with patient? YES  LONG TERM GOALS: (STG=LTG)    Name Target Date Goal status  1 Pt will be able to verbalize understanding of pertinent lymphedema risk reduction practices relevant to her dx specifically related to skin care.  Baseline:  No knowledge 08/28/2023 Achieved at eval  2 Pt will be able to return demo and/or verbalize understanding of the post op HEP related to regaining shoulder ROM. Baseline:  No knowledge 08/28/2023 Achieved at eval  3 Pt will be able to verbalize understanding of the importance of attending the post op After Breast CA Class for further lymphedema risk reduction education and therapeutic exercise.  Baseline:  No knowledge 08/28/2023 Achieved at eval  4 Pt will demo she has regained full shoulder ROM and function post operatively compared to baselines.  Baseline: See objective measurements taken today. 10/23/2023     PLAN:  PT FREQUENCY/DURATION: EVAL and 1 follow up appointment.   PLAN FOR NEXT SESSION: will reassess 3-4 weeks post op to determine needs.   Patient will follow up at outpatient cancer rehab 3-4 weeks following surgery.  If the patient requires physical therapy at that time, a specific plan will be dictated and sent to the referring physician for  approval. The patient was educated today on appropriate basic range of motion exercises to begin post operatively and the importance of attending the After Breast Cancer class following surgery.  Patient was educated today on lymphedema risk reduction practices as it pertains to recommendations that will benefit the patient immediately following surgery.  She verbalized good understanding.    Physical Therapy Information for After Breast  Cancer Surgery/Treatment:  Lymphedema is a swelling condition that you may be at risk for in your arm if you have lymph nodes removed from the armpit area.  After a sentinel node biopsy, the risk is approximately 5-9% and is higher after an axillary node dissection.  There is treatment available for this condition and it is not life-threatening.  Contact your physician or physical therapist with concerns. You may begin the 4 shoulder/posture exercises (see additional sheet) when permitted by your physician (typically a week after surgery).  If you have drains, you may need to wait until those are removed before beginning range of motion exercises.  A general recommendation is to not lift your arms above shoulder height until drains are removed.  These exercises should be done to your tolerance and gently.  This is not a no pain/no gain type of recovery so listen to your body and stretch into the range of motion that you can tolerate, stopping if you have pain.  If you are having immediate reconstruction, ask your plastic surgeon about doing exercises as he or she may want you to wait. We encourage you to attend the free one time ABC (After Breast Cancer) class offered by [facility].  You will learn information related to lymphedema risk, prevention and treatment and additional exercises to regain mobility following surgery.  You can call [phone] for more information.  This is offered the 1st and 3rd Monday of each month.  You only attend the class one time. While undergoing any medical procedure or treatment, try to avoid blood pressure being taken or needle sticks from occurring on the arm on the side of cancer.   This recommendation begins after surgery and continues for the rest of your life.  This may help reduce your risk of getting lymphedema (swelling in your arm). An excellent resource for those seeking information on lymphedema is the National Lymphedema Network's web  site. It can be accessed at www.lymphnet.org If you notice swelling in your hand, arm or breast at any time following surgery (even if it is many years from now), please contact your doctor or physical therapist to discuss this.  Lymphedema can be treated at any time but it is easier for you if it is treated early on.  If you feel like your shoulder motion is not returning to normal in a reasonable amount of time, please contact your surgeon or physical therapist.  [facility] [phone]. [address], [place] [address]  ABC CLASS After Breast Cancer Class  After Breast Cancer Class is a specially designed exercise class to assist you in a safe recover after having breast cancer surgery.  In this class you will learn how to get back to full function whether your drains were just removed or if you had surgery a month ago.  This one-time class is held the 1st and 3rd Monday of every month from 11:00 a.m. until 12:00 noon virtually.  This class is FREE and space is limited. For more information or to register for the next available  class, call [phone].  Class Goals  Understand specific stretches to improve the flexibility of you chest and shoulder. Learn ways to safely strengthen your upper body and improve your posture. Understand the warning signs of infection and why you may be at risk for an arm infection. Learn about Lymphedema and prevention.  ** You do not attend this class until after surgery.  Drains must be removed to participate  Patient was instructed today in a home exercise program today for post op shoulder range of motion. These included active assist shoulder flexion in sitting, scapular retraction, wall walking with shoulder abduction, and hands behind head external rotation.  She was encouraged to do these twice a day, holding 3 seconds and repeating 5 times when permitted by her physician.  Eward Wonda Sharps,  [place] 08/28/23 4:46 PM

## 2023-08-29 ENCOUNTER — Encounter: Payer: Self-pay | Admitting: Genetic Counselor

## 2023-08-29 NOTE — Progress Notes (Signed)
 REFERRING PROVIDER: Odean Potts, MD [address] [place],  [place] [address]  PRIMARY PROVIDER:  Fabio Norberta Duncans, PA-C  PRIMARY REASON FOR VISIT:  1. Malignant neoplasm of lower-inner quadrant of left breast in female, estrogen receptor positive (HCC)   2. Family history of ovarian cancer   3. Family history of prostate cancer   4. Family history of gene mutation   5. Family history of colon cancer     HISTORY OF PRESENT ILLNESS:   Alicia Roach, a 48 y.o. female, was seen for a [place] cancer genetics consultation during the breast multidisciplinary clinic at the request of Dr. Odean due to a personal history of breast cancer.  Alicia Roach presents to clinic today to discuss the possibility of a hereditary predisposition to cancer, to discuss genetic testing, and to further clarify her future cancer risks, as well as potential cancer risks for family members.   In December 2024, at the age of 89, Alicia Roach was diagnosed with invasive ductal carcinoma of the left breast (ER+/PR+/HER2-). The treatment plan is pending.   CANCER HISTORY:  Oncology History  Malignant neoplasm of lower-inner quadrant of left breast in female, estrogen receptor positive (HCC)  08/20/2023 Initial Diagnosis   Screening mammogram detected left breast distortion at 7 o'clock position measuring 1.2 cm.  Extra lymph node biopsy benign, biopsy of the breast: Grade 2 IDC ER 90%, PR 90%, HER2 negative by FISH, Ki-67 5%   08/26/2023 Cancer Staging   Staging form: Breast, AJCC 8th Edition - Clinical stage from 08/26/2023: Stage IA (cT1c, cN0(f), cM0, G2, ER+, PR+, HER2-) - Signed by Odean Potts, MD on 08/28/2023 Stage prefix: Initial diagnosis Method of lymph node assessment: Core biopsy Histologic grading system: 3 grade system       Past Medical History:  Diagnosis Date   GAD (generalized anxiety disorder)    Dr. Elodie McKinney--sees her regularly   Hypothyroidism    s/p radioactive iodine  ablation 2010    Retropharyngeal abscess 1997    Past Surgical History:  Procedure Laterality Date   BREAST BIOPSY Left 08/20/2023   US  LT BREAST BX W LOC DEV 1ST LESION IMG BX SPEC US  GUIDE 08/20/2023 [facility] MAMMOGRAPHY   CESAREAN SECTION  10/11/2011   Procedure: CESAREAN SECTION;  Surgeon: Charlie CHRISTELLA Croak, MD;  Location: [facility];  Service: Gynecology;  Laterality: N/A;   digital reconstructive surgery (middle finger of L hand)     TONSILLECTOMY  1999   WISDOM TOOTH EXTRACTION       FAMILY HISTORY:  We obtained a detailed, 4-generation family history.  Significant diagnoses are listed below: Family History  Problem Relation Age of Onset   Prostate cancer Maternal Uncle        x2 mat uncles   Cancer Maternal Uncle        unknown; mets dx >50   Skin cancer Paternal Aunt    Prostate cancer Paternal Uncle    Prostate cancer Cousin        mat cousin; mets; reported BRCA1 pos   Colon cancer Cousin        mat female cousin; mets; reported BRCA1 pos   Ovarian cancer Cousin        mat cousin; reported BRCA1 pos   Stomach cancer Cousin        mat cousin   Cancer Cousin        mat cousin; lung and kidney cancers     Alicia Roach reported that several maternal cousins,  including those with metastatic prostate cancer and ovarian cancer, are BRCA1 positive.  Some of those that are BRCA1 positive are cousins to each other. No reports were available for review today.  Alicia Roach did not report any known genetic testing in closer relatives (mother, siblings, etc).   There is no reported Ashkenazi Jewish ancestry. There is no known consanguinity.  GENETIC COUNSELING ASSESSMENT: [name] is a 48 y.o. female with a personal history of breast cancer which is somewhat suggestive of a hereditary cancer syndrome given her age of diagnosis, especially in the context of a family history of a reported BRCA1 gene mutation. We, therefore, discussed and recommended the following at today's visit.    DISCUSSION: We discussed that 5 - 10% of cancer is hereditary, with most cases of hereditary breast cancer associated with mutations in BRCA1/2.  We discussed that if her maternal cousins do have a BRCA1 mutation, her risk of having the same BRCA1 gene mutation is at least 25%. There are other genes that can be associated with hereditary breast cancer syndromes.  Type of cancer risk and level of risk are gene-specific. We discussed that testing is beneficial for several reasons including knowing how to follow individuals after completing their treatment, identifying whether potential treatment options would be beneficial, and understanding if other family members could be at risk for cancer and allowing them to undergo genetic testing.   We reviewed the characteristics, features and inheritance patterns of hereditary cancer syndromes. We also discussed genetic testing, including the appropriate family members to test, the process of testing, insurance coverage and turn-around-time for results. We discussed the implications of a negative, positive and/or variant of uncertain significant result. In order to get genetic test results in a timely manner so that Alicia Roach can use these genetic test results for surgical decisions, we recommended Alicia Roach pursue genetic testing for the [facility] BRCAPlus Panel.  The BRCAPlus gene panel offered by [facility] and includes sequencing and rearrangement analysis for the following 13 genes: ATM, BARD1, BRCA1, BRCA2, CDH1, CHEK2, NF1, PALB2, PTEN, RAD51C, RAD51D, STK11 and TP53. Once complete, we recommend [name] pursue reflex genetic testing to a more comprehensive gene panel.   The CancerNext-Expanded gene panel offered by [facility] and includes sequencing, rearrangement, and RNA analysis for the following 76 genes: AIP, ALK, APC, ATM, AXIN2, BAP1, BARD1, BMPR1A, BRCA1, BRCA2, BRIP1, CDC73, CDH1, CDK4, CDKN1B, CDKN2A, CEBPA, CHEK2, CTNNA1, DDX41, DICER1, ETV6,  FH, FLCN, GATA2, LZTR1, MAX, MBD4, MEN1, MET, MLH1, MSH2, MSH3, MSH6, MUTYH, NF1, NF2, NTHL1, PALB2, PHOX2B, PMS2, POT1, PRKAR1A, PTCH1, PTEN, RAD51C, RAD51D, RB1, RET, RUNX1, SDHA, SDHAF2, SDHB, SDHC, SDHD, SMAD4, SMARCA4, SMARCB1, SMARCE1, STK11, SUFU, TMEM127, TP53, TSC1, TSC2, VHL, and WT1 (sequencing and deletion/duplication); EGFR, HOXB13, KIT, MITF, PDGFRA, POLD1, and POLE (sequencing only); EPCAM and GREM1 (deletion/duplication only).    Based on Ms. Michelini's personal history of breast cancer at age 36, she meets NCCN criteria for panel-based genetic testing. She is the best relative to test at this point in time.  Despite that she meets criteria, she may still have an out of pocket cost. We discussed that if her out of pocket cost for testing is over $100, the laboratory should contact them to discuss self-pay prices, patient pay assistance programs, if applicable, and other billing options.   PLAN: After considering the risks, benefits, and limitations, Ms. Payano provided informed consent to pursue genetic testing and the blood sample was sent to [facility] for analysis of the  BRCAPlus and CancerNext-Expanded +RNAinsight Panel. Results should be available within approximately 1-2 weeks' time, at which point they will be disclosed by telephone to Ms. Zentner, as will any additional recommendations warranted by these results. Ms. Pepitone will receive a summary of her genetic counseling visit and a copy of her results once available. This information will also be available in Epic.   Ms. Gisler's questions were answered to her satisfaction today. Our contact information was provided should additional questions or concerns arise. Thank you for the referral and allowing us  to share in the care of your patient.   Onesha Krebbs M. Nydia, MS, [facility] Genetic Counselor [email or web address] (P) [phone]    20 minutes were spent on the date of the encounter in service to the patient including  preparation, face-to-face consultation, documentation and care coordination.  The patient was accompanied by her significant other.  Dr. Gudena was available to discuss this case as needed.   _______________________________________________________________________ For Office Staff:  Number of people involved in session: 2 Was an Intern/ student involved with case: no

## 2023-09-03 ENCOUNTER — Other Ambulatory Visit: Payer: Self-pay | Admitting: General Surgery

## 2023-09-03 DIAGNOSIS — C50312 Malignant neoplasm of lower-inner quadrant of left female breast: Secondary | ICD-10-CM

## 2023-09-05 ENCOUNTER — Telehealth: Payer: Self-pay | Admitting: *Deleted

## 2023-09-05 ENCOUNTER — Encounter: Payer: Self-pay | Admitting: *Deleted

## 2023-09-05 NOTE — Telephone Encounter (Signed)
Spoke with patient to follow up from [facility] and assess navigation needs. Patient denies any questions or concerns at this time.  Encouraged her to call should anything arise.

## 2023-09-06 ENCOUNTER — Telehealth: Payer: Self-pay | Admitting: Genetic Counselor

## 2023-09-06 ENCOUNTER — Ambulatory Visit: Payer: Self-pay | Admitting: Genetic Counselor

## 2023-09-06 ENCOUNTER — Encounter: Payer: Self-pay | Admitting: Genetic Counselor

## 2023-09-06 DIAGNOSIS — Z800 Family history of malignant neoplasm of digestive organs: Secondary | ICD-10-CM

## 2023-09-06 DIAGNOSIS — Z8481 Family history of carrier of genetic disease: Secondary | ICD-10-CM

## 2023-09-06 DIAGNOSIS — Z8042 Family history of malignant neoplasm of prostate: Secondary | ICD-10-CM

## 2023-09-06 DIAGNOSIS — Z1379 Encounter for other screening for genetic and chromosomal anomalies: Secondary | ICD-10-CM | POA: Insufficient documentation

## 2023-09-06 DIAGNOSIS — Z170 Estrogen receptor positive status [ER+]: Secondary | ICD-10-CM

## 2023-09-06 DIAGNOSIS — Z8041 Family history of malignant neoplasm of ovary: Secondary | ICD-10-CM

## 2023-09-06 NOTE — Telephone Encounter (Signed)
Called Alicia Roach in attempt to disclose negative genetics results.  LVM requesting CB.  Report uploaded to chart.

## 2023-09-06 NOTE — Telephone Encounter (Signed)
Disclosed negative genetics.    

## 2023-09-10 NOTE — Progress Notes (Signed)
HPI:   Ms. Alicia Roach was previously seen in the [facility] clinic due to a personal and family history of breast cancer and concerns regarding a hereditary predisposition to cancer.    Ms. Costlow's recent genetic test results were disclosed to her by telephone. These results and recommendations are discussed in more detail below.  CANCER HISTORY:  Oncology History  Malignant neoplasm of lower-inner quadrant of left breast in female, estrogen receptor positive (HCC)  08/20/2023 Initial Diagnosis   Screening mammogram detected left breast distortion at 7 o'clock position measuring 1.2 cm.  Extra lymph node biopsy benign, biopsy of the breast: Grade 2 IDC ER 90%, PR 90%, HER2 negative by FISH, Ki-67 5%   08/26/2023 Cancer Staging   Staging form: Breast, AJCC 8th Edition - Clinical stage from 08/26/2023: Stage IA (cT1c, cN0(f), cM0, G2, ER+, PR+, HER2-) - Signed by Serena Croissant, MD on 08/28/2023 Stage prefix: Initial diagnosis Method of lymph node assessment: Core biopsy Histologic grading system: 3 grade system   09/05/2023 Genetic Testing   Negative [facility] CancerNext-Expanded +RNAinsight Panel.  Report date is 09/05/2023.   The CancerNext-Expanded gene panel offered by [facility] and includes sequencing, rearrangement, and RNA analysis for the following 76 genes: AIP, ALK, APC, ATM, AXIN2, BAP1, BARD1, BMPR1A, BRCA1, BRCA2, BRIP1, CDC73, CDH1, CDK4, CDKN1B, CDKN2A, CEBPA, CHEK2, CTNNA1, DDX41, DICER1, ETV6, FH, FLCN, GATA2, LZTR1, MAX, MBD4, MEN1, MET, MLH1, MSH2, MSH3, MSH6, MUTYH, NF1, NF2, NTHL1, PALB2, PHOX2B, PMS2, POT1, PRKAR1A, PTCH1, PTEN, RAD51C, RAD51D, RB1, RET, RUNX1, SDHA, SDHAF2, SDHB, SDHC, SDHD, SMAD4, SMARCA4, SMARCB1, SMARCE1, STK11, SUFU, TMEM127, TP53, TSC1, TSC2, VHL, and WT1 (sequencing and deletion/duplication); EGFR, HOXB13, KIT, MITF, PDGFRA, POLD1, and POLE (sequencing only); EPCAM and GREM1 (deletion/duplication only).      FAMILY HISTORY:  We obtained a  detailed, 4-generation family history.  Significant diagnoses are listed below:      Family History  Problem Relation Age of Onset   Prostate cancer Maternal Uncle          x2 mat uncles   Cancer Maternal Uncle          unknown; mets dx >50   Skin cancer Paternal Aunt     Prostate cancer Paternal Uncle     Prostate cancer Cousin          mat cousin; mets; reported BRCA1 pos   Colon cancer Cousin          mat female cousin; mets; reported BRCA1 pos   Ovarian cancer Cousin          mat cousin; reported BRCA1 pos   Stomach cancer Cousin          mat cousin   Cancer Cousin          mat cousin; lung and kidney cancers       Ms. Coone reported that several maternal cousins, including those with metastatic prostate cancer and ovarian cancer, are BRCA1 positive.  Some of those that are BRCA1 positive are cousins to each other. No reports were available for review today.  Ms. Dudoit did not report any known genetic testing in closer relatives (mother, siblings, etc).    There is no reported Ashkenazi Jewish ancestry. There is no known consanguinity.  GENETIC TEST RESULTS:  The [facility] CancerNext-Expanded +RNAinsight Panel found no pathogenic mutations.   The CancerNext-Expanded gene panel offered by [facility] and includes sequencing, rearrangement, and RNA analysis for the following 76 genes: AIP, ALK, APC, ATM, AXIN2, BAP1, BARD1, BMPR1A,  BRCA1, BRCA2, BRIP1, CDC73, CDH1, CDK4, CDKN1B, CDKN2A, CEBPA, CHEK2, CTNNA1, DDX41, DICER1, ETV6, FH, FLCN, GATA2, LZTR1, MAX, MBD4, MEN1, MET, MLH1, MSH2, MSH3, MSH6, MUTYH, NF1, NF2, NTHL1, PALB2, PHOX2B, PMS2, POT1, PRKAR1A, PTCH1, PTEN, RAD51C, RAD51D, RB1, RET, RUNX1, SDHA, SDHAF2, SDHB, SDHC, SDHD, SMAD4, SMARCA4, SMARCB1, SMARCE1, STK11, SUFU, TMEM127, TP53, TSC1, TSC2, VHL, and WT1 (sequencing and deletion/duplication); EGFR, HOXB13, KIT, MITF, PDGFRA, POLD1, and POLE (sequencing only); EPCAM and GREM1 (deletion/duplication only).    The test  report has been scanned into EPIC and is located under the Molecular Pathology section of the Results Review tab.  A portion of the result report is included below for reference. Genetic testing reported out on September 05, 2023.    Of note, Ms. Jenkin's maternal cousins are reportedly BRCA1 positive.  No reports were available to review or to provide to the laboratory for comment on her report.  Ms. Gardner genetic testing included BRCA1 sequencing, deletion/duplication analysis, and RNA analysis, and no deleterious variants in this gene were detected.   Even though a pathogenic variant was not identified, possible explanations for her personal history of breast cancer may include: Ms. Thau's cancer may be sporadic/familial or due to other genetic and environmental factors.  Most cancer is not hereditary.  There may be a gene mutation in one of these genes that current testing methods cannot detect but that chance is small. There could be another gene that has not yet been discovered, or that we have not yet tested, that is responsible for her personal history of breast cancer.   Therefore, it is important to remain in touch with cancer genetics in the future so that we can continue to offer Ms. Scotto the most up to date genetic testing.   ADDITIONAL GENETIC TESTING:   Ms. Denherder genetic testing was fairly extensive.  If there are additional relevant genes identified to increase cancer risk that can be analyzed in the future, we would be happy to discuss and coordinate this testing at that time.     CANCER SCREENING RECOMMENDATIONS:  Ms. Azbill test result is considered negative (normal).  This means that we have not identified a hereditary cause for her personal history of breast cancer at this time.   An individual's cancer risk and medical management are not determined by genetic test results alone. Overall cancer risk assessment incorporates additional factors, including personal medical  history, family history, and any available genetic information that may result in a personalized plan for cancer prevention and surveillance. Therefore, it is recommended she continue to follow the cancer management and screening guidelines provided by her oncology and primary healthcare provider.   RECOMMENDATIONS FOR FAMILY MEMBERS:   Since she did not inherit a identifiable mutation in a cancer predisposition gene included on this panel, her children could not have inherited a known mutation from her in one of these genes. Individuals in this family might be at some increased risk of developing cancer, over the general population risk, due to the family history of cancer.  Individuals in the family should notify their providers of the family history of cancer. We recommend women in this family have a yearly mammogram beginning at age 70, or 59 years younger than the earliest onset of cancer, an annual clinical breast exam, and perform monthly breast self-exams.  Risk models that take into account family history and hormonal history may be helpful in determining appropriate breast cancer screening options for family members.  Other members of the family may  still carry a pathogenic variant in one of these genes that Ms. Laprade did not inherit. Based on the family history and the reported BRCA1 gene mutation in several maternal relatives, we strongly recommend her mother and siblings (if mother is not available for testing or if she tests positive) have genetic counseling and testing.  Other maternal relatives should also have appropriate testing/counseling. Ms. Ayler can let us know if we can be of any assistance in coordinating genetic counseling and/or testing for family members.     FOLLOW-UP:  Cancer genetics is a rapidly advancing field and it is possible that new genetic tests will be appropriate for her and/or her family members in the future. We encourage Ms. Machia to remain in contact with cancer  genetics, so we can update her personal and family histories and let her know of advances in cancer genetics that may benefit this family.   Our contact number was provided.  They are welcome to call us at anytime with additional questions or concerns.   Lakeia Bradshaw M. Rennie Plowman, MS, [facility] Genetic Counselor [email or web address] (P) [phone]

## 2023-09-12 ENCOUNTER — Encounter: Payer: Self-pay | Admitting: *Deleted

## 2023-09-12 NOTE — Progress Notes (Signed)
Surgical Instructions   Your procedure is scheduled on Tuesday, January 28th, 2025. Report to [facility] Main Entrance "A" at 1:00 P.M., then check in with the Admitting office. Any questions or running late day of surgery: call [phone]  Questions prior to your surgery date: call [phone], Monday-Friday, 8am-4pm. If you experience any cold or flu symptoms such as cough, fever, chills, shortness of breath, etc. between now and your scheduled surgery, please notify us at the above number.     Remember:  Do not eat after midnight the night before your surgery   You may drink clear liquids until 12:00 the day of your surgery.   Clear liquids allowed are: Water, Non-Citrus Juices (without pulp), Carbonated Beverages, Clear Tea (no milk, honey, etc.), Black Coffee Only (NO MILK, CREAM OR POWDERED CREAMER of any kind), and Gatorade.  Patient Instructions  The night before surgery:  No food after midnight. ONLY clear liquids after midnight  The day of surgery (if you do NOT have diabetes):  Drink ONE (1) Pre-Surgery Clear Ensure by 12:00 the day of surgery. Drink in one sitting. Do not sip.  This drink was given to you during your hospital  pre-op appointment visit.  Nothing else to drink after completing the  Pre-Surgery Clear Ensure.          If you have questions, please contact your surgeon's office.     Take these medicines the morning of surgery with A SIP OF WATER: Levothyroxine (Synthroid) Venlafaxine XR (Effexor-XR)   May take these medicines IF NEEDED:  None.     One week prior to surgery, STOP taking any Aspirin (unless otherwise instructed by your surgeon) Aleve, Naproxen, Ibuprofen, Motrin, Advil, Goody's, BC's, all herbal medications, fish oil, and non-prescription vitamins.                     Do NOT Smoke (Tobacco/Vaping) for 24 hours prior to your procedure.  If you use a CPAP at night, you may bring your mask/headgear for your overnight stay.    You will be asked to remove any contacts, glasses, piercing's, hearing aid's, dentures/partials prior to surgery. Please bring cases for these items if needed.    Patients discharged the day of surgery will not be allowed to drive home, and someone needs to stay with them for 24 hours.  SURGICAL WAITING ROOM VISITATION Patients may have no more than 2 support people in the waiting area - these visitors may rotate.   Pre-op nurse will coordinate an appropriate time for 1 ADULT support person, who may not rotate, to accompany patient in pre-op.  Children under the age of 75 must have an adult with them who is not the patient and must remain in the main waiting area with an adult.  If the patient needs to stay at the hospital during part of their recovery, the visitor guidelines for inpatient rooms apply.  Please refer to the [facility] website for the visitor guidelines for any additional information.   If you received a COVID test during your pre-op visit  it is requested that you wear a mask when out in public, stay away from anyone that may not be feeling well and notify your surgeon if you develop symptoms. If you have been in contact with anyone that has tested positive in the last 10 days please notify you surgeon.      Pre-operative CHG Bathing Instructions   You can play a key role in reducing the  risk of infection after surgery. Your skin needs to be as free of germs as possible. You can reduce the number of germs on your skin by washing with CHG (chlorhexidine gluconate) soap before surgery. CHG is an antiseptic soap that kills germs and continues to kill germs even after washing.   DO NOT use if you have an allergy to chlorhexidine/CHG or antibacterial soaps. If your skin becomes reddened or irritated, stop using the CHG and notify one of our RNs at [phone].              TAKE A SHOWER THE NIGHT BEFORE SURGERY AND THE DAY OF SURGERY    Please keep in mind the following:   DO NOT shave, including legs and underarms, 48 hours prior to surgery.   You may shave your face before/day of surgery.  Place clean sheets on your bed the night before surgery Use a clean washcloth (not used since being washed) for each shower. DO NOT sleep with pet's night before surgery.  CHG Shower Instructions:  Wash your face and private area with normal soap. If you choose to wash your hair, wash first with your normal shampoo.  After you use shampoo/soap, rinse your hair and body thoroughly to remove shampoo/soap residue.  Turn the water OFF and apply half the bottle of CHG soap to a CLEAN washcloth.  Apply CHG soap ONLY FROM YOUR NECK DOWN TO YOUR TOES (washing for 3-5 minutes)  DO NOT use CHG soap on face, private areas, open wounds, or sores.  Pay special attention to the area where your surgery is being performed.  If you are having back surgery, having someone wash your back for you may be helpful. Wait 2 minutes after CHG soap is applied, then you may rinse off the CHG soap.  Pat dry with a clean towel  Put on clean pajamas    Additional instructions for the day of surgery: DO NOT APPLY any lotions, deodorants, cologne, or perfumes.   Do not wear jewelry or makeup Do not wear nail polish, gel polish, artificial nails, or any other type of covering on natural nails (fingers and toes) Do not bring valuables to the hospital. [facility] is not responsible for valuables/personal belongings. Put on clean/comfortable clothes.  Please brush your teeth.  Ask your nurse before applying any prescription medications to the skin.

## 2023-09-13 ENCOUNTER — Other Ambulatory Visit: Payer: Self-pay

## 2023-09-13 ENCOUNTER — Encounter (HOSPITAL_COMMUNITY): Payer: Self-pay

## 2023-09-13 ENCOUNTER — Encounter (HOSPITAL_COMMUNITY)
Admission: RE | Admit: 2023-09-13 | Discharge: 2023-09-13 | Disposition: A | Discharge status: Home / Self Care | Payer: No Typology Code available for payment source | Source: Ambulatory Visit | Attending: General Surgery | Admitting: General Surgery

## 2023-09-13 VITALS — BP 129/94 | HR 113 | Temp 97.9°F | Resp 17 | Ht 66.0 in | Wt 154.3 lb

## 2023-09-13 DIAGNOSIS — Z01812 Encounter for preprocedural laboratory examination: Secondary | ICD-10-CM | POA: Diagnosis present

## 2023-09-13 DIAGNOSIS — Z01818 Encounter for other preprocedural examination: Secondary | ICD-10-CM

## 2023-09-13 HISTORY — DX: Nausea with vomiting, unspecified: R11.2

## 2023-09-13 HISTORY — DX: Other specified postprocedural states: Z98.890

## 2023-09-13 HISTORY — DX: Depression, unspecified: F32.A

## 2023-09-13 LAB — POCT PREGNANCY, URINE: Preg Test, Ur: NEGATIVE

## 2023-09-13 NOTE — Progress Notes (Signed)
PCP - [facility] Cardiologist - denies  PPM/ICD - denies Device Orders -  n/a Rep Notified -  n/a  Chest x-ray - denies EKG - denies Stress Test - denies ECHO - denies Cardiac Cath - denies  Sleep Study - denies CPAP -  n/a  No DM  Last dose of GLP1 agonist-  n/a GLP1 instructions:  n/a  Blood Thinner Instructions: n/a Aspirin Instructions: n/a  ERAS Protcol -  clears until 1200 PRE-SURGERY Ensure or G2-  Ensure as ordered  COVID TEST-  n/a   Anesthesia review: yes - seed  Patient denies shortness of breath, fever, cough and chest pain at PAT appointment   All instructions explained to the patient, with a verbal understanding of the material. Patient agrees to go over the instructions while at home for a better understanding. Patient also instructed to self quarantine after being tested for COVID-19. The opportunity to ask questions was provided.   Patient states that she is taking Aldactone for hair loss.

## 2023-09-16 ENCOUNTER — Ambulatory Visit
Admission: RE | Admit: 2023-09-16 | Discharge: 2023-09-16 | Disposition: A | Discharge status: Home / Self Care | Payer: No Typology Code available for payment source | Source: Ambulatory Visit | Attending: General Surgery | Admitting: General Surgery

## 2023-09-16 ENCOUNTER — Other Ambulatory Visit: Payer: Self-pay | Admitting: General Surgery

## 2023-09-16 DIAGNOSIS — Z170 Estrogen receptor positive status [ER+]: Secondary | ICD-10-CM

## 2023-09-16 HISTORY — PX: BREAST BIOPSY: SHX20

## 2023-09-17 ENCOUNTER — Other Ambulatory Visit: Payer: Self-pay

## 2023-09-17 ENCOUNTER — Encounter (HOSPITAL_COMMUNITY): Payer: Self-pay | Admitting: General Surgery

## 2023-09-17 ENCOUNTER — Ambulatory Visit (HOSPITAL_COMMUNITY)
Admission: RE | Admit: 2023-09-17 | Discharge: 2023-09-17 | Disposition: A | Discharge status: Home / Self Care | Payer: No Typology Code available for payment source | Attending: General Surgery | Admitting: General Surgery

## 2023-09-17 ENCOUNTER — Ambulatory Visit
Admission: RE | Admit: 2023-09-17 | Discharge: 2023-09-17 | Disposition: A | Discharge status: Home / Self Care | Payer: No Typology Code available for payment source | Source: Ambulatory Visit | Attending: General Surgery | Admitting: General Surgery

## 2023-09-17 ENCOUNTER — Encounter (HOSPITAL_COMMUNITY)
Admission: RE | Disposition: A | Discharge status: Home / Self Care | Payer: Self-pay | Source: Home / Self Care | Attending: General Surgery

## 2023-09-17 ENCOUNTER — Ambulatory Visit (HOSPITAL_COMMUNITY): Payer: Self-pay | Admitting: Physician Assistant

## 2023-09-17 DIAGNOSIS — Z87891 Personal history of nicotine dependence: Secondary | ICD-10-CM | POA: Diagnosis not present

## 2023-09-17 DIAGNOSIS — C50312 Malignant neoplasm of lower-inner quadrant of left female breast: Secondary | ICD-10-CM

## 2023-09-17 DIAGNOSIS — C50912 Malignant neoplasm of unspecified site of left female breast: Secondary | ICD-10-CM | POA: Diagnosis present

## 2023-09-17 HISTORY — PX: BREAST LUMPECTOMY WITH RADIOACTIVE SEED AND SENTINEL LYMPH NODE BIOPSY: SHX6550

## 2023-09-17 HISTORY — PX: RADIOACTIVE SEED GUIDED AXILLARY SENTINEL LYMPH NODE: SHX6735

## 2023-09-17 HISTORY — DX: Malignant (primary) neoplasm, unspecified: C80.1

## 2023-09-17 SURGERY — BREAST LUMPECTOMY WITH RADIOACTIVE SEED AND SENTINEL LYMPH NODE BIOPSY
Anesthesia: Regional | Site: Breast | Laterality: Left

## 2023-09-17 MED ORDER — HEMOSTATIC AGENTS (NO CHARGE) OPTIME
TOPICAL | Status: DC | PRN
Start: 2023-09-17 — End: 2023-09-17
  Administered 2023-09-17 (×2): 1

## 2023-09-17 MED ORDER — CEFAZOLIN SODIUM-DEXTROSE 2-4 GM/100ML-% IV SOLN
2.0000 g | INTRAVENOUS | Status: DC
  Filled 2023-09-17: qty 100

## 2023-09-17 MED ORDER — 0.9 % SODIUM CHLORIDE (POUR BTL) OPTIME
TOPICAL | Status: DC | PRN
  Administered 2023-09-17: 1000 mL

## 2023-09-17 MED ORDER — FENTANYL CITRATE (PF) 100 MCG/2ML IJ SOLN: 25.0000 ug | INTRAMUSCULAR | Status: DC | PRN

## 2023-09-17 MED ORDER — LACTATED RINGERS IV SOLN: INTRAVENOUS | Status: DC

## 2023-09-17 MED ORDER — METOPROLOL TARTRATE 5 MG/5ML IV SOLN
INTRAVENOUS | Status: AC
  Filled 2023-09-17: qty 5

## 2023-09-17 MED ORDER — BUPIVACAINE-EPINEPHRINE 0.25% -1:200000 IJ SOLN
INTRAMUSCULAR | Status: DC | PRN
  Administered 2023-09-17: 4 mL

## 2023-09-17 MED ORDER — ACETAMINOPHEN 500 MG PO TABS: 1000.0000 mg | ORAL_TABLET | Freq: Once | ORAL | Status: DC | PRN

## 2023-09-17 MED ORDER — DEXAMETHASONE SODIUM PHOSPHATE 10 MG/ML IJ SOLN
INTRAMUSCULAR | Status: DC | PRN
  Administered 2023-09-17: 10 mg via INTRAVENOUS

## 2023-09-17 MED ORDER — ENSURE PRE-SURGERY PO LIQD: 296.0000 mL | Freq: Once | ORAL | Status: DC

## 2023-09-17 MED ORDER — MIDAZOLAM HCL 5 MG/5ML IJ SOLN
INTRAMUSCULAR | Status: DC | PRN
  Administered 2023-09-17: 2 mg via INTRAVENOUS

## 2023-09-17 MED ORDER — MAGTRACE LYMPHATIC TRACER
INTRAMUSCULAR | Status: DC | PRN
  Administered 2023-09-17: 2 mL via INTRAMUSCULAR

## 2023-09-17 MED ORDER — PROPOFOL 500 MG/50ML IV EMUL
INTRAVENOUS | Status: DC | PRN
  Administered 2023-09-17: 150 ug/kg/min via INTRAVENOUS

## 2023-09-17 MED ORDER — ACETAMINOPHEN 325 MG PO TABS: 650.0000 mg | ORAL_TABLET | ORAL | Status: DC | PRN

## 2023-09-17 MED ORDER — ONDANSETRON HCL 4 MG/2ML IJ SOLN
INTRAMUSCULAR | Status: DC | PRN
  Administered 2023-09-17: 4 mg via INTRAVENOUS

## 2023-09-17 MED ORDER — CHLORHEXIDINE GLUCONATE CLOTH 2 % EX PADS: 6.0000 | MEDICATED_PAD | Freq: Once | CUTANEOUS | Status: DC

## 2023-09-17 MED ORDER — PHENYLEPHRINE HCL-NACL 20-0.9 MG/250ML-% IV SOLN
INTRAVENOUS | Status: DC | PRN
  Administered 2023-09-17: 35 ug/min via INTRAVENOUS

## 2023-09-17 MED ORDER — CHLORHEXIDINE GLUCONATE 0.12 % MT SOLN
15.0000 mL | Freq: Once | OROMUCOSAL | Status: AC
Start: 2023-09-17 — End: 2023-09-17
  Administered 2023-09-17: 15 mL via OROMUCOSAL
  Filled 2023-09-17: qty 15

## 2023-09-17 MED ORDER — ACETAMINOPHEN 500 MG PO TABS
1000.0000 mg | ORAL_TABLET | ORAL | Status: AC
  Administered 2023-09-17: 1000 mg via ORAL
  Filled 2023-09-17: qty 2

## 2023-09-17 MED ORDER — FENTANYL CITRATE (PF) 250 MCG/5ML IJ SOLN
INTRAMUSCULAR | Status: AC
  Filled 2023-09-17: qty 5

## 2023-09-17 MED ORDER — MIDAZOLAM HCL 2 MG/2ML IJ SOLN
INTRAMUSCULAR | Status: AC
  Filled 2023-09-17: qty 2

## 2023-09-17 MED ORDER — FENTANYL CITRATE (PF) 100 MCG/2ML IJ SOLN
INTRAMUSCULAR | Status: AC
  Filled 2023-09-17: qty 2

## 2023-09-17 MED ORDER — ORAL CARE MOUTH RINSE: 15.0000 mL | Freq: Once | OROMUCOSAL | Status: AC

## 2023-09-17 MED ORDER — PHENYLEPHRINE HCL (PRESSORS) 10 MG/ML IV SOLN
INTRAVENOUS | Status: DC | PRN
  Administered 2023-09-17 (×2): 80 ug via INTRAVENOUS

## 2023-09-17 MED ORDER — KETOROLAC TROMETHAMINE 15 MG/ML IJ SOLN
15.0000 mg | INTRAMUSCULAR | Status: AC
  Administered 2023-09-17: 15 mg via INTRAVENOUS
  Filled 2023-09-17: qty 1

## 2023-09-17 MED ORDER — OXYCODONE HCL 5 MG PO TABS
5.0000 mg | ORAL_TABLET | ORAL | Status: DC | PRN
  Filled 2023-09-17: qty 2

## 2023-09-17 MED ORDER — OXYCODONE HCL 5 MG PO TABS: 5.0000 mg | ORAL_TABLET | Freq: Once | ORAL | Status: DC | PRN

## 2023-09-17 MED ORDER — OXYCODONE HCL 5 MG PO TABS: 5.0000 mg | ORAL_TABLET | Freq: Four times a day (QID) | ORAL | 0 refills | Status: DC | PRN

## 2023-09-17 MED ORDER — SODIUM CHLORIDE 0.9 % IV SOLN: 250.0000 mL | INTRAVENOUS | Status: DC | PRN

## 2023-09-17 MED ORDER — BUPIVACAINE-EPINEPHRINE (PF) 0.25% -1:200000 IJ SOLN
INTRAMUSCULAR | Status: AC
  Filled 2023-09-17: qty 30

## 2023-09-17 MED ORDER — ACETAMINOPHEN 10 MG/ML IV SOLN: 1000.0000 mg | Freq: Once | INTRAVENOUS | Status: DC | PRN

## 2023-09-17 MED ORDER — BUPIVACAINE-EPINEPHRINE (PF) 0.5% -1:200000 IJ SOLN
INTRAMUSCULAR | Status: DC | PRN
  Administered 2023-09-17: 25 mL via PERINEURAL

## 2023-09-17 MED ORDER — LIDOCAINE-EPINEPHRINE (PF) 1.5 %-1:200000 IJ SOLN
INTRAMUSCULAR | Status: DC | PRN
  Administered 2023-09-17: 3 mL via PERINEURAL

## 2023-09-17 MED ORDER — OXYCODONE HCL 5 MG/5ML PO SOLN: 5.0000 mg | Freq: Once | ORAL | Status: DC | PRN

## 2023-09-17 MED ORDER — ACETAMINOPHEN 160 MG/5ML PO SOLN: 1000.0000 mg | Freq: Once | ORAL | Status: DC | PRN

## 2023-09-17 MED ORDER — PROPOFOL 10 MG/ML IV BOLUS
INTRAVENOUS | Status: DC | PRN
  Administered 2023-09-17 (×2): 50 mg via INTRAVENOUS
  Administered 2023-09-17: 60 mg via INTRAVENOUS
  Administered 2023-09-17: 50 mg via INTRAVENOUS
  Administered 2023-09-17: 200 mg via INTRAVENOUS

## 2023-09-17 MED ORDER — SODIUM CHLORIDE 0.9% FLUSH: 3.0000 mL | INTRAVENOUS | Status: DC | PRN

## 2023-09-17 MED ORDER — ACETAMINOPHEN 650 MG RE SUPP: 650.0000 mg | RECTAL | Status: DC | PRN

## 2023-09-17 MED ORDER — FENTANYL CITRATE (PF) 250 MCG/5ML IJ SOLN
INTRAMUSCULAR | Status: DC | PRN
  Administered 2023-09-17: 100 ug via INTRAVENOUS
  Administered 2023-09-17 (×2): 50 ug via INTRAVENOUS

## 2023-09-17 MED ORDER — METOPROLOL TARTRATE 5 MG/5ML IV SOLN
2.5000 mg | INTRAVENOUS | Status: DC | PRN
  Administered 2023-09-17: 2.5 mg via INTRAVENOUS

## 2023-09-17 SURGICAL SUPPLY — 35 items
APPLIER CLIP 9.375 MED OPEN (MISCELLANEOUS)
CANISTER SUCT 3000ML PPV (MISCELLANEOUS) ×2 IMPLANT
CHLORAPREP W/TINT 26 (MISCELLANEOUS) ×2 IMPLANT
CLIP APPLIE 9.375 MED OPEN (MISCELLANEOUS) IMPLANT
CLIP TI MEDIUM 6 (CLIP) ×2 IMPLANT
COVER PROBE W GEL 5X96 (DRAPES) ×4 IMPLANT
COVER SURGICAL LIGHT HANDLE (MISCELLANEOUS) ×2 IMPLANT
DERMABOND ADVANCED .7 DNX12 (GAUZE/BANDAGES/DRESSINGS) ×2 IMPLANT
DEVICE DUBIN SPECIMEN MAMMOGRA (MISCELLANEOUS) ×2 IMPLANT
DRAPE CHEST BREAST 15X10 FENES (DRAPES) ×2 IMPLANT
ELECT COATED BLADE 2.86 ST (ELECTRODE) ×2 IMPLANT
ELECT REM PT RETURN 9FT ADLT (ELECTROSURGICAL) ×2
ELECTRODE REM PT RTRN 9FT ADLT (ELECTROSURGICAL) ×2 IMPLANT
GLOVE BIO SURGEON STRL SZ7 (GLOVE) ×2 IMPLANT
GLOVE BIOGEL PI IND STRL 7.5 (GLOVE) ×2 IMPLANT
GOWN STRL REUS W/ TWL LRG LVL3 (GOWN DISPOSABLE) ×4 IMPLANT
HEMOSTAT ARISTA ABSORB 3G PWDR (HEMOSTASIS) IMPLANT
KIT BASIN OR (CUSTOM PROCEDURE TRAY) ×2 IMPLANT
KIT MARKER MARGIN INK (KITS) ×2 IMPLANT
NDL 18GX1X1/2 (RX/OR ONLY) (NEEDLE) IMPLANT
NDL FILTER BLUNT 18X1 1/2 (NEEDLE) IMPLANT
NDL HYPO 25GX1X1/2 BEV (NEEDLE) ×2 IMPLANT
NEEDLE 18GX1X1/2 (RX/OR ONLY) (NEEDLE) ×2 IMPLANT
NEEDLE FILTER BLUNT 18X1 1/2 (NEEDLE) ×2 IMPLANT
NEEDLE HYPO 25GX1X1/2 BEV (NEEDLE) ×2 IMPLANT
NS IRRIG 1000ML POUR BTL (IV SOLUTION) ×2 IMPLANT
PACK GENERAL/GYN (CUSTOM PROCEDURE TRAY) ×2 IMPLANT
STRIP CLOSURE SKIN 1/2X4 (GAUZE/BANDAGES/DRESSINGS) ×2 IMPLANT
SUT MNCRL AB 4-0 PS2 18 (SUTURE) ×4 IMPLANT
SUT MON AB 5-0 PS2 18 (SUTURE) IMPLANT
SUT SILK 2 0 SH (SUTURE) IMPLANT
SUT VIC AB 2-0 SH 27XBRD (SUTURE) ×4 IMPLANT
SUT VIC AB 3-0 SH 27X BRD (SUTURE) ×4 IMPLANT
SYR CONTROL 10ML LL (SYRINGE) ×2 IMPLANT
TOWEL GREEN STERILE FF (TOWEL DISPOSABLE) ×2 IMPLANT

## 2023-09-17 NOTE — Interval H&P Note (Signed)
History and Physical Interval Note:  09/17/2023 2:03 PM  Alicia Roach  has presented today for surgery, with the diagnosis of LEFT BREAST CANCER.  The various methods of treatment have been discussed with the patient and family. After consideration of risks, benefits and other options for treatment, the patient has consented to  Procedure(s) with comments: LEFT BREAST SEED GUIDED LUMPECTOMY, LEFT AXILLARY SENTINEL NODE BIOPSY (Left) - GEN PEC BLOCK LEFT AXILLARY NODE SEED GUIDED EXCISION (Left) as a surgical intervention.  The patient's history has been reviewed, patient examined, no change in status, stable for surgery.  I have reviewed the patient's chart and labs.  Questions were answered to the patient's satisfaction.     Emelia Loron

## 2023-09-17 NOTE — H&P (Signed)
O  History of Present Illness: 76 yof who had screening detected left breast distortion. Breast density is c. In the medial breast 1.2x1.2 cm. US shows a mass. There are multiple prominent nodes as well. 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 The biopsy of the node is benign but no lymphoid tissue present. She is here with her husband to discuss options   Review of Systems: A complete review of systems was obtained from the patient. I have reviewed this information and discussed as appropriate with the patient. See HPI as well for other ROS.  Review of Systems All other systems reviewed and are negative.   Medical History: Past Medical History: Diagnosis Date Anxiety History of cancer Thyroid disease  Past Surgical History: Procedure Laterality Date CESAREAN SECTION 2013 JOINT REPLACEMENT TONSILLECTOMY 1997   Allergies Allergen Reactions Methimazole Rash  Current Outpatient Medications on File Prior to Visit Medication Sig Dispense Refill levonorgestrel-ethinyl estradiol (SEASONALE) 0.15 mg-30 mcg (91) tablet Take 1 tablet by mouth once daily levothyroxine (SYNTHROID) 125 MCG tablet TAKE 1 TABLET BY MOUTH EVERY DAY IN THE MORNING ON AN EMPTY STOMACH FOR 30 DAYS for 90 spironolactone (ALDACTONE) 25 MG tablet 1 tablet Orally venlafaxine (EFFEXOR-XR) 75 MG XR capsule Take 75 mg by mouth once daily   Family History Problem Relation Age of Onset Obesity Sister High blood pressure (Hypertension) Sister Hyperlipidemia (Elevated cholesterol) Sister   Social History  Tobacco Use Smoking Status Former Types: Cigarettes Smokeless Tobacco Not on file Marital status: Single Tobacco Use Smoking status: Former Types: Cigarettes Substance and Sexual Activity Alcohol use: Yes Drug use: Not Currently  Objective:  Physical Exam Constitutional: Appearance: Normal appearance. Chest: Breasts: Right: No  inverted nipple, mass or nipple discharge. Left: No inverted nipple, mass or nipple discharge. Lymphadenopathy: Upper Body: Right upper body: No supraclavicular or axillary adenopathy. Left upper body: No supraclavicular or axillary adenopathy. Neurological: Mental Status: She is alert.  Assessment and Plan:  Clinical stage I left breast cancer. Genetic testing Left breast seed guided lumpectomy, left axillary sentinel node biopsy, left axillary node seed guided excision  We discussed the staging and pathophysiology of breast cancer. We discussed all of the different options for treatment for breast cancer including surgery, chemotherapy, radiation therapy, and antiestrogen therapy.  We discussed a sentinel lymph node biopsy as she does not appear to having lymph node involvement right now. I will also try to do seed guided excision of node that was attempted to be biopsied. I think reasonable given size and her age with nondiagnostic biopsy. We discussed the performance of that with injection of radioactive tracer. We discussed that there is a chance of having a positive node with a sentinel lymph node biopsy and we will await the permanent pathology to make any other first further decisions in terms of her treatment. We discussed up to a 5% risk lifetime of chronic shoulder pain as well as lymphedema associated with a sentinel lymph node biopsy.  We discussed the options for treatment of the breast cancer which included lumpectomy versus a mastectomy. We discussed the performance of the lumpectomy with radioactive seed placement. We discussed a 5-10% chance of a positive margin requiring reexcision in the operating room. We also discussed that she will likely need radiation therapy if she undergoes lumpectomy. We discussed mastectomy and the postoperative care for that as well. Mastectomy can be followed by reconstruction. The decision for  lumpectomy vs mastectomy has no impact on decision for  chemotherapy. Most mastectomy patients will not need radiation therapy. We discussed that there is no difference in her survival whether she undergoes lumpectomy with radiation therapy or antiestrogen therapy versus a mastectomy. There is also no real difference between her recurrence in the breast.  We discussed the risks of operation including bleeding, infection, possible reoperation. She understands her further therapy will be based on what her stages at the time of her operation.

## 2023-09-17 NOTE — Transfer of Care (Signed)
Immediate Anesthesia Transfer of Care Note  Patient: Alicia Roach  Procedure(s) Performed: LEFT BREAST SEED GUIDED LUMPECTOMY, LEFT AXILLARY SENTINEL NODE BIOPSY (Left: Breast) LEFT AXILLARY NODE SEED GUIDED EXCISION (Left)  Patient Location: PACU  Anesthesia Type:General  Level of Consciousness: drowsy, patient cooperative, and responds to stimulation  Airway & Oxygen Therapy: Patient connected to face mask oxygen  Post-op Assessment: Report given to RN and Post -op Vital signs reviewed and stable  Post vital signs: Reviewed and stable  Last Vitals:  Vitals Value Taken Time  BP 123/76 09/17/23 1607  Temp 36.7 C 09/17/23 1607  Pulse 110 09/17/23 1609  Resp 13 09/17/23 1609  SpO2 100 % 09/17/23 1609  Vitals shown include unfiled device data.  Last Pain:  Vitals:   09/17/23 1607  PainSc: Asleep      Patients Stated Pain Goal: 0 (09/17/23 1245)  Complications: No notable events documented.

## 2023-09-17 NOTE — Discharge Instructions (Signed)
[facility] Office Phone Number [phone]  POST OP INSTRUCTIONS Take 400 mg of ibuprofen every 8 hours or 650 mg tylenol every 6 hours for next 72 hours then as needed. Use ice several times daily also.  A prescription for pain medication may be given to you upon discharge.  Take your pain medication as prescribed, if needed.  If narcotic pain medicine is not needed, then you may take acetaminophen (Tylenol), naprosyn (Alleve) or ibuprofen (Advil) as needed. Take your usually prescribed medications unless otherwise directed If you need a refill on your pain medication, please contact your pharmacy.  They will contact our office to request authorization.  Prescriptions will not be filled after 5pm or on week-ends. You should eat very light the first 24 hours after surgery, such as soup, crackers, pudding, etc.  Resume your normal diet the day after surgery. Most patients will experience some swelling and bruising in the breast.  Ice packs and a good support bra will help.  Wear the breast binder provided or a sports bra for 72 hours day and night.  After that wear a sports bra during the day until you return to the office. Swelling and bruising can take several days to resolve.  It is common to experience some constipation if taking pain medication after surgery.  Increasing fluid intake and taking a stool softener will usually help or prevent this problem from occurring.  A mild laxative (Milk of Magnesia or Miralax) should be taken according to package directions if there are no bowel movements after 48 hours. I used skin glue on the incision, you may shower in 24 hours.  The glue will flake off over the next 2-3 weeks.  Any sutures or staples will be removed at the office during your follow-up visit. ACTIVITIES:  You may resume regular daily activities (gradually increasing) beginning the next day.  Wearing a good support bra or sports bra minimizes pain and swelling.  You may have  sexual intercourse when it is comfortable. You may drive when you no longer are taking prescription pain medication, you can comfortably wear a seatbelt, and you can safely maneuver your car and apply brakes. RETURN TO WORK:  ______________________________________________________________________________________ Bonita Quin should see your doctor in the office for a follow-up appointment approximately two weeks after your surgery.  Your doctor's nurse will typically make your follow-up appointment when she calls you with your pathology report.  Expect your pathology report 3-4 business days after your surgery.  You may call to check if you do not hear from [place] after three days. OTHER INSTRUCTIONS: _______________________________________________________________________________________________ _____________________________________________________________________________________________________________________________________ _____________________________________________________________________________________________________________________________________ _____________________________________________________________________________________________________________________________________  WHEN TO CALL DR Shaterria Sager: Fever over 101.0 Nausea and/or vomiting. Extreme swelling or bruising. Continued bleeding from incision. Increased pain, redness, or drainage from the incision.  The clinic staff is available to answer your questions during regular business hours.  Please don't hesitate to call and ask to speak to one of the nurses for clinical concerns.  If you have a medical emergency, go to the nearest emergency room or call 911.  A surgeon from [facility] is always on call at the hospital.  For further questions, please visit [email or web address]

## 2023-09-17 NOTE — Anesthesia Preprocedure Evaluation (Signed)
Anesthesia Evaluation  Patient identified by MRN, date of birth, ID band Patient awake    Reviewed: Allergy & Precautions, NPO status , Patient's Chart, lab work & pertinent test results  History of Anesthesia Complications (+) PONV and history of anesthetic complications  Airway Mallampati: III  TM Distance: >3 FB Neck ROM: Full    Dental  (+) Dental Advisory Given   Pulmonary neg shortness of breath, neg sleep apnea, neg COPD, neg recent URI, former smoker   breath sounds clear to auscultation       Cardiovascular negative cardio ROS  Rhythm:Regular     Neuro/Psych  PSYCHIATRIC DISORDERS Anxiety Depression       GI/Hepatic negative GI ROS, Neg liver ROS,,,  Endo/Other  Hypothyroidism    Renal/GU negative Renal ROS     Musculoskeletal   Abdominal   Peds  Hematology   Anesthesia Other Findings   Reproductive/Obstetrics                              Anesthesia Physical Anesthesia Plan  ASA: 2  Anesthesia Plan: General and Regional   Post-op Pain Management: Regional block*, Tylenol PO (pre-op)* and Toradol IV (intra-op)*   Induction: Intravenous  PONV Risk Score and Plan: 4 or greater and Ondansetron, Dexamethasone, Propofol infusion, TIVA and Midazolam  Airway Management Planned: LMA  Additional Equipment: None  Intra-op Plan:   Post-operative Plan: Extubation in OR  Informed Consent: I have reviewed the patients History and Physical, chart, labs and discussed the procedure including the risks, benefits and alternatives for the proposed anesthesia with the patient or authorized representative who has indicated his/her understanding and acceptance.     Dental advisory given  Plan Discussed with: CRNA  Anesthesia Plan Comments:          Anesthesia Quick Evaluation

## 2023-09-17 NOTE — Anesthesia Procedure Notes (Signed)
Anesthesia Regional Block: Pectoralis block   Pre-Anesthetic Checklist: , timeout performed,  Correct Patient, Correct Site, Correct Laterality,  Correct Procedure, Correct Position, site marked,  Risks and benefits discussed,  Surgical consent,  Pre-op evaluation,  At surgeon's request and post-op pain management  Laterality: Left  Prep: chloraprep       Needles:  Injection technique: Single-shot      Needle Length: 9cm  Needle Gauge: 22     Additional Needles: Arrow StimuQuik ECHO Echogenic Stimulating PNB Needle  Procedures:,,,, ultrasound used (permanent image in chart),,    Narrative:  Start time: 09/17/2023 2:01 PM End time: 09/17/2023 2:07 PM Injection made incrementally with aspirations every 5 mL.  Performed by: Personally  Anesthesiologist: Val Eagle, MD

## 2023-09-17 NOTE — Op Note (Signed)
Preoperative diagnosis: Clinical stage I left breast cancer Postoperative diagnosis: Same as above Procedure: 1.  Left breast radioactive seed guided lumpectomy 2.  Injection of mag trace for sentinel lymph node identification 3.  Left deep axillary sentinel lymph node biopsy 4.  Left radioactive seed guided axillary node excision Surgeon: Dr. Harden Mo Anesthesia: General With a pectoral block Estimated blood loss: Minimal Specimens: 1.  Left breast tissue marked with paint containing seed and clip 2.  Additional superior, posterior, medial margins marked short superior, long lateral, double deep 3.  Left radioactive seed containing axillary node which is also a sentinel node plus other sentinel nodes all sent together Complications: None Drains: None Sponge no counts correct completion Disposition recovery stable condition  Indications: This a 48 year old female had a screening detected left breast distortion.  She has a 1.2 cm lesion in the left breast.  There are multiple prominent nodes as well.  Biopsy of a breast mass is a grade 2 invasive ductal carcinoma that is hormone receptor positive and a low proliferation index.  Biopsy the node was benign but there is no lymphoid tissue.  We elected to proceed with breast conservation therapy.  We also discussed doing a seed guided excision of the node that was enlarged.  Procedure: After informed consent was obtained she first underwent a pectoral block.  She was taken to the operative room placed under general anesthesia.  She had SCDs in place.  Antibiotics were given.  She was prepped and draped in a standard sterile surgical fashion.  A surgical timeout was then performed.  I injected 1.5 cc of mag trace in the subareolar position and massaged for 5 minutes.  I first did the lumpectomy.  I noted the seed in the lower inner quadrant.  I made a periareolar incision in order to hide the scar later.  I did infiltrate Marcaine throughout  this area.  I then used the neoprobe to guide the excision of the seed in the surrounding tissue with an attempt to get a clear margin.  I did a 3D CT images that showed removal of the seed and the clip.  I thought I might be close to several margins so I removed additional superior, medial, and posterior margins.  The posterior margin is now the muscle.  I placed a couple of clips in the cavity.  I eventually closed the breast tissue down with 2-0 Vicryl.  The skin was closed with 3-0 Vicryl 5-0 Monocryl and glue and Steri-Strips were applied.  I was able to identify the radioactive seed containing node in the axilla.  I then made an incision in the low axilla below the hairline.  I carried this through the axillary fascia.  I noted the axillary node that contained the radioactive seed.  There were a bundle of lymph nodes altogether.  These were also the sentinel nodes as they had mag trace present both with the probe as well as visually there was some brown staining.  I removed all these as a bundle together.  There were no other enlarged nodes and very little activity background for mag trace.  I did do a mammogram confirmed removal of the seed.  These were then passed off the table.  I obtained hemostasis.  I closed the axillary fascia with 2-0 Vicryl.  The skin was closed with 3-0 Vicryl for Monocryl.  Glue and Steri-Strips were applied.  She tolerated this well was extubated and transferred recovery stable.

## 2023-09-17 NOTE — Anesthesia Procedure Notes (Signed)
Procedure Name: LMA Insertion Date/Time: 09/17/2023 3:09 PM  Performed by: Venia Carbon, CRNAPre-anesthesia Checklist: Patient identified, Emergency Drugs available, Suction available, Patient being monitored and Timeout performed Patient Re-evaluated:Patient Re-evaluated prior to induction Oxygen Delivery Method: Circle system utilized Preoxygenation: Pre-oxygenation with 100% oxygen Induction Type: IV induction Ventilation: Mask ventilation without difficulty LMA: LMA inserted Laryngoscope Size: 3 Number of attempts: 2 (Unable to advance LMA 4 easily. LMA 3 inserted) Placement Confirmation: positive ETCO2, CO2 detector and breath sounds checked- equal and bilateral

## 2023-09-17 NOTE — H&P (View-Only) (Signed)
 O  History of Present Illness: 76 yof who had screening detected left breast distortion. Breast density is c. In the medial breast 1.2x1.2 cm. US shows a mass. There are multiple prominent nodes as well. 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 The biopsy of the node is benign but no lymphoid tissue present. She is here with her husband to discuss options   Review of Systems: A complete review of systems was obtained from the patient. I have reviewed this information and discussed as appropriate with the patient. See HPI as well for other ROS.  Review of Systems All other systems reviewed and are negative.   Medical History: Past Medical History: Diagnosis Date Anxiety History of cancer Thyroid disease  Past Surgical History: Procedure Laterality Date CESAREAN SECTION 2013 JOINT REPLACEMENT TONSILLECTOMY 1997   Allergies Allergen Reactions Methimazole Rash  Current Outpatient Medications on File Prior to Visit Medication Sig Dispense Refill levonorgestrel-ethinyl estradiol (SEASONALE) 0.15 mg-30 mcg (91) tablet Take 1 tablet by mouth once daily levothyroxine (SYNTHROID) 125 MCG tablet TAKE 1 TABLET BY MOUTH EVERY DAY IN THE MORNING ON AN EMPTY STOMACH FOR 30 DAYS for 90 spironolactone (ALDACTONE) 25 MG tablet 1 tablet Orally venlafaxine (EFFEXOR-XR) 75 MG XR capsule Take 75 mg by mouth once daily   Family History Problem Relation Age of Onset Obesity Sister High blood pressure (Hypertension) Sister Hyperlipidemia (Elevated cholesterol) Sister   Social History  Tobacco Use Smoking Status Former Types: Cigarettes Smokeless Tobacco Not on file Marital status: Single Tobacco Use Smoking status: Former Types: Cigarettes Substance and Sexual Activity Alcohol use: Yes Drug use: Not Currently  Objective:  Physical Exam Constitutional: Appearance: Normal appearance. Chest: Breasts: Right: No  inverted nipple, mass or nipple discharge. Left: No inverted nipple, mass or nipple discharge. Lymphadenopathy: Upper Body: Right upper body: No supraclavicular or axillary adenopathy. Left upper body: No supraclavicular or axillary adenopathy. Neurological: Mental Status: She is alert.  Assessment and Plan:  Clinical stage I left breast cancer. Genetic testing Left breast seed guided lumpectomy, left axillary sentinel node biopsy, left axillary node seed guided excision  We discussed the staging and pathophysiology of breast cancer. We discussed all of the different options for treatment for breast cancer including surgery, chemotherapy, radiation therapy, and antiestrogen therapy.  We discussed a sentinel lymph node biopsy as she does not appear to having lymph node involvement right now. I will also try to do seed guided excision of node that was attempted to be biopsied. I think reasonable given size and her age with nondiagnostic biopsy. We discussed the performance of that with injection of radioactive tracer. We discussed that there is a chance of having a positive node with a sentinel lymph node biopsy and we will await the permanent pathology to make any other first further decisions in terms of her treatment. We discussed up to a 5% risk lifetime of chronic shoulder pain as well as lymphedema associated with a sentinel lymph node biopsy.  We discussed the options for treatment of the breast cancer which included lumpectomy versus a mastectomy. We discussed the performance of the lumpectomy with radioactive seed placement. We discussed a 5-10% chance of a positive margin requiring reexcision in the operating room. We also discussed that she will likely need radiation therapy if she undergoes lumpectomy. We discussed mastectomy and the postoperative care for that as well. Mastectomy can be followed by reconstruction. The decision for  lumpectomy vs mastectomy has no impact on decision for  chemotherapy. Most mastectomy patients will not need radiation therapy. We discussed that there is no difference in her survival whether she undergoes lumpectomy with radiation therapy or antiestrogen therapy versus a mastectomy. There is also no real difference between her recurrence in the breast.  We discussed the risks of operation including bleeding, infection, possible reoperation. She understands her further therapy will be based on what her stages at the time of her operation.

## 2023-09-18 ENCOUNTER — Encounter (HOSPITAL_COMMUNITY): Payer: Self-pay | Admitting: General Surgery

## 2023-09-18 NOTE — Anesthesia Postprocedure Evaluation (Signed)
Anesthesia Post Note  Patient: Alicia Roach  Procedure(s) Performed: LEFT BREAST SEED GUIDED LUMPECTOMY, LEFT AXILLARY SENTINEL NODE BIOPSY (Left: Breast) LEFT AXILLARY NODE SEED GUIDED EXCISION (Left: Axilla)     Patient location during evaluation: PACU Anesthesia Type: Regional and General Level of consciousness: awake and alert Pain management: pain level controlled Vital Signs Assessment: post-procedure vital signs reviewed and stable Respiratory status: spontaneous breathing, nonlabored ventilation, respiratory function stable and patient connected to nasal cannula oxygen Cardiovascular status: blood pressure returned to baseline and stable Postop Assessment: no apparent nausea or vomiting Anesthetic complications: no   No notable events documented.                  Thomas Rhude

## 2023-09-19 LAB — SURGICAL PATHOLOGY

## 2023-09-20 ENCOUNTER — Encounter: Payer: Self-pay | Admitting: *Deleted

## 2023-09-23 NOTE — Assessment & Plan Note (Addendum)
08/20/2023:Screening mammogram detected left breast distortion at 7 o'clock position measuring 1.2 cm.  Extra lymph node biopsy benign, biopsy of the breast: Grade 2 IDC ER 90%, PR 90%, HER2 negative by FISH, Ki-67 5%  09/17/2023: Left lumpectomy: Grade 1 IDC 2 cm with intermediate grade DCIS, margins negative, LVI identified, ER 90%, PR 90%, HER2 negative by FISH, Ki-67 5%, 1/5 lymph nodes positive  Pathology counseling: I discussed the final pathology report of the patient provided  a copy of this report. I discussed the margins as well as lymph node surgeries. We also discussed the final staging along with previously performed ER/PR and HER-2/neu testing.  Treatment plan: Standard of care would be adjuvant chemotherapy with dose dense Adriamycin and Cytoxan x 4 followed by Taxol weekly x 12. Molecular testing make [employer] are not able to identify any patients who would not be eligible for chemotherapy based upon Rx ponder clinical trial. Other option would be to participate in offset clinical trial which randomizes patients to chemotherapy versus complete estrogen blockade.

## 2023-09-24 ENCOUNTER — Telehealth: Payer: Self-pay | Admitting: *Deleted

## 2023-09-24 ENCOUNTER — Encounter: Payer: Self-pay | Admitting: *Deleted

## 2023-09-24 ENCOUNTER — Inpatient Hospital Stay
Payer: No Typology Code available for payment source | Attending: Hematology and Oncology | Admitting: Hematology and Oncology

## 2023-09-24 VITALS — BP 114/78 | HR 107 | Temp 98.1°F | Resp 18 | Ht 66.0 in | Wt 153.7 lb

## 2023-09-24 DIAGNOSIS — Z1732 Human epidermal growth factor receptor 2 negative status: Secondary | ICD-10-CM | POA: Insufficient documentation

## 2023-09-24 DIAGNOSIS — C50312 Malignant neoplasm of lower-inner quadrant of left female breast: Secondary | ICD-10-CM | POA: Insufficient documentation

## 2023-09-24 DIAGNOSIS — Z170 Estrogen receptor positive status [ER+]: Secondary | ICD-10-CM | POA: Diagnosis not present

## 2023-09-24 DIAGNOSIS — Z79632 Long term (current) use of antitumor antibiotic: Secondary | ICD-10-CM | POA: Diagnosis not present

## 2023-09-24 DIAGNOSIS — Z1721 Progesterone receptor positive status: Secondary | ICD-10-CM | POA: Diagnosis not present

## 2023-09-24 DIAGNOSIS — Z5111 Encounter for antineoplastic chemotherapy: Secondary | ICD-10-CM | POA: Diagnosis present

## 2023-09-24 DIAGNOSIS — Z79630 Long term (current) use of alkylating agent: Secondary | ICD-10-CM | POA: Diagnosis not present

## 2023-09-24 DIAGNOSIS — Z5189 Encounter for other specified aftercare: Secondary | ICD-10-CM | POA: Insufficient documentation

## 2023-09-24 DIAGNOSIS — Z79899 Other long term (current) drug therapy: Secondary | ICD-10-CM | POA: Diagnosis not present

## 2023-09-24 NOTE — Progress Notes (Signed)
 Patient Care Team: Fabio Norberta Duncans, PA-C as PCP - General (Family Medicine) Tyree Nanetta SAILOR, RN as Oncology Nurse Navigator Glean Stephane BROCKS, RN as Oncology Nurse Navigator Ebbie Cough, MD as Consulting Physician (General Surgery) Odean Potts, MD as Consulting Physician (Hematology and Oncology) Dewey Rush, MD as Consulting Physician (Radiation Oncology)  DIAGNOSIS:  Encounter Diagnosis  Name Primary?   Malignant neoplasm of lower-inner quadrant of left breast in female, estrogen receptor positive (HCC) Yes    SUMMARY OF ONCOLOGIC HISTORY: Oncology History  Malignant neoplasm of lower-inner quadrant of left breast in female, estrogen receptor positive (HCC)  08/20/2023 Initial Diagnosis   Screening mammogram detected left breast distortion at 7 o'clock position measuring 1.2 cm.  Extra lymph node biopsy benign, biopsy of the breast: Grade 2 IDC ER 90%, PR 90%, HER2 negative by FISH, Ki-67 5%   08/26/2023 Cancer Staging   Staging form: Breast, AJCC 8th Edition - Clinical stage from 08/26/2023: Stage IA (cT1c, cN0(f), cM0, G2, ER+, PR+, HER2-) - Signed by Odean Potts, MD on 08/28/2023 Stage prefix: Initial diagnosis Method of lymph node assessment: Core biopsy Histologic grading system: 3 grade system   09/05/2023 Genetic Testing   Negative [facility] CancerNext-Expanded +RNAinsight Panel.  Report date is 09/05/2023.   The CancerNext-Expanded gene panel offered by [facility] and includes sequencing, rearrangement, and RNA analysis for the following 76 genes: AIP, ALK, APC, ATM, AXIN2, BAP1, BARD1, BMPR1A, BRCA1, BRCA2, BRIP1, CDC73, CDH1, CDK4, CDKN1B, CDKN2A, CEBPA, CHEK2, CTNNA1, DDX41, DICER1, ETV6, FH, FLCN, GATA2, LZTR1, MAX, MBD4, MEN1, MET, MLH1, MSH2, MSH3, MSH6, MUTYH, NF1, NF2, NTHL1, PALB2, PHOX2B, PMS2, POT1, PRKAR1A, PTCH1, PTEN, RAD51C, RAD51D, RB1, RET, RUNX1, SDHA, SDHAF2, SDHB, SDHC, SDHD, SMAD4, SMARCA4, SMARCB1, SMARCE1, STK11, SUFU, TMEM127, TP53, TSC1,  TSC2, VHL, and WT1 (sequencing and deletion/duplication); EGFR, HOXB13, KIT, MITF, PDGFRA, POLD1, and POLE (sequencing only); EPCAM and GREM1 (deletion/duplication only).      CHIEF COMPLIANT: Follow-up to discuss treatment plan  HISTORY OF PRESENT ILLNESS:   History of Present Illness   Alicia Roach is a 48 year old female with breast cancer who presents for post-surgical follow-up and discussion of treatment options.  She underwent surgery for breast cancer, which was identified as a 2 cm grade 1 tumor with lymphovascular invasion. The cancer was estrogen receptor-positive, progesterone receptor-positive, and HER2-negative. Five lymph nodes were removed during surgery, with one testing positive for cancer, leading to a stage 1B classification.  Post-surgery, she has numbness in the back upper part of her arm, which began last night. She is concerned about whether this is normal and if it could be related to lymphedema.  She is currently considering treatment options, including chemotherapy and participation in a clinical trial. She is concerned about the side effects of chemotherapy, such as hair loss, nausea, fatigue, and changes in blood counts. She is also considering the potential impact of treatment on her ability to work, noting that some patients take short-term disability or intermittent FMLA during chemotherapy.         ALLERGIES:  is allergic to methimazole.  MEDICATIONS:  Current Outpatient Medications  Medication Sig Dispense Refill   B Complex Vitamins (B COMPLEX 100 PO) Take 1 tablet by mouth daily.     cholecalciferol (VITAMIN D3) 25 MCG (1000 UNIT) tablet Take 1,000 Units by mouth daily.     Collagen-Vitamin C-Biotin (COLLAGEN PO) Take 2 Scoops by mouth daily. Grass Fed Bovine Collagen Peptides     levothyroxine  (SYNTHROID ) 125 MCG tablet  Take 125 mcg by mouth daily before breakfast.     Magnesium Citrate POWD Take 4 g by mouth daily. 4g = 1 scoop     N-ACETYL  CYSTEINE PO Take 900 mg by mouth daily.     OVER THE COUNTER MEDICATION Take 1 capsule by mouth daily. Rhodiola Rosea     OVER THE COUNTER MEDICATION Take 15 mLs by mouth daily. MCT Oil     oxyCODONE  (OXY IR/ROXICODONE ) 5 MG immediate release tablet Take 1 tablet (5 mg total) by mouth every 6 (six) hours as needed. 10 tablet 0   Prenatal Vit-Fe Fumarate-FA (PRENATAL MULTIVITAMIN) TABS tablet Take 1 tablet by mouth daily.     spironolactone (ALDACTONE) 25 MG tablet Take 25 mg by mouth daily.     venlafaxine XR (EFFEXOR-XR) 150 MG 24 hr capsule Take 150 mg by mouth daily.     venlafaxine XR (EFFEXOR-XR) 75 MG 24 hr capsule Take 75 mg by mouth daily.     VRAYLAR 1.5 MG capsule Take 1.5 mg by mouth every other day.     No current facility-administered medications for this visit.    PHYSICAL EXAMINATION: ECOG PERFORMANCE STATUS: 1 - Symptomatic but completely ambulatory  Vitals:   09/24/23 1138  BP: 114/78  Pulse: (!) 107  Resp: 18  Temp: 98.1 F (36.7 C)  SpO2: 99%   Filed Weights   09/24/23 1138  Weight: 153 lb 11.2 oz (69.7 kg)      LABORATORY DATA:  I have reviewed the data as listed    Latest Ref Rng & Units 08/28/2023   12:02 PM 10/18/2009   11:36 PM  CMP  Glucose 70 - 99 mg/dL 75  856   BUN 6 - 20 mg/dL 22  15   Creatinine 9.55 - 1.00 mg/dL 9.03  9.27   Sodium 864 - 145 mmol/L 141  140   Potassium 3.5 - 5.1 mmol/L 4.2  4.2   Chloride 98 - 111 mmol/L 106  106   CO2 22 - 32 mmol/L 27  27   Calcium 8.9 - 10.3 mg/dL 9.6  9.3   Total Protein 6.5 - 8.1 g/dL 7.4  7.2   Total Bilirubin 0.0 - 1.2 mg/dL 0.3  0.3   Alkaline Phos 38 - 126 U/L 63  48   AST 15 - 41 U/L 14  18   ALT 0 - 44 U/L 7  13     Lab Results  Component Value Date   WBC 9.8 08/28/2023   HGB 15.1 (H) 08/28/2023   HCT 44.6 08/28/2023   MCV 91.8 08/28/2023   PLT 266 08/28/2023   NEUTROABS 6.3 08/28/2023    ASSESSMENT & PLAN:  Malignant neoplasm of lower-inner quadrant of left breast in female,  estrogen receptor positive (HCC) 08/20/2023:Screening mammogram detected left breast distortion at 7 o'clock position measuring 1.2 cm.  Extra lymph node biopsy benign, biopsy of the breast: Grade 2 IDC ER 90%, PR 90%, HER2 negative by FISH, Ki-67 5%  09/17/2023: Left lumpectomy: Grade 1 IDC 2 cm with intermediate grade DCIS, margins negative, LVI identified, ER 90%, PR 90%, HER2 negative by FISH, Ki-67 5%, 1/5 lymph nodes positive  Pathology counseling: I discussed the final pathology report of the patient provided  a copy of this report. I discussed the margins as well as lymph node surgeries. We also discussed the final staging along with previously performed ER/PR and HER-2/neu testing.  Treatment plan: Standard of care would be adjuvant chemotherapy with dose  dense Adriamycin  and Cytoxan  x 4 followed by Taxol  weekly x 12. Molecular testing make Oncotype are not able to identify any patients who would not be eligible for chemotherapy based upon Rx ponder clinical trial. Other option would be to participate in offset clinical trial which randomizes patients to chemotherapy versus complete estrogen blockade. ------------------------------------- Assessment and Plan    Breast Cancer (Stage 1B) Diagnosed with Stage 1B breast cancer post-surgery. Tumor was 2 cm, grade 1, with lymphovascular invasion. One out of five lymph nodes positive for cancer. Estrogen receptor-positive, progesterone receptor-positive, HER2-negative. Proliferation rate slow. Premenopausal, current guidelines recommend chemotherapy for younger patients with lymph node involvement. Discussed clinical trial (Offset) to determine if complete estrogen blockade could be an alternative to chemotherapy. Explained that the trial involves random assignment to chemotherapy or complete estrogen blockade based on Oncotype score. Discussed standard chemotherapy regimens (ACT and TC times six) and their side effects, including hair loss, nausea,  fatigue, decreased blood counts, and potential cardiac issues. Discussed DigniCap for hair loss prevention and the need for an echocardiogram to monitor heart function. Discussed antiestrogen treatment post-chemotherapy and radiation. - Provide clinical trial information (Offset) for review - Assign a research nurse - If opting for standard treatment, initiate chemotherapy with ACT regimen (Adriamycin , Cytoxan , and Taxol ) or TC times six regimen (Taxotere and Cytoxan ) - Monitor for side effects including hair loss, nausea, fatigue, decreased blood counts, and potential cardiac issues - Consider DigniCap for hair loss prevention - Schedule echocardiogram to monitor heart function - Discuss antiestrogen treatment post-chemotherapy and radiation      No orders of the defined types were placed in this encounter.  The patient has a good understanding of the overall plan. she agrees with it. she will call with any problems that may develop before the next visit here. Total time spent: 30 mins including face to face time and time spent for planning, charting and co-ordination of care   Viinay K Kadiatou Oplinger, MD 09/24/23

## 2023-09-24 NOTE — Telephone Encounter (Signed)
Received order for oncotype testing per Dr. Pamelia Hoit. Requisition sent to pathology

## 2023-09-24 NOTE — Research (Signed)
 Trial:  [id number]: A Phase III Adjuvant Trial Evaluating the Addition of Adjuvant Chemotherapy to Ovarian Function Suppression plus Endocrine Therapy in Premenopausal Patients with pN0-1, ER-Positive/HER2-Negative Breast Cancer and an Oncotype Recurrence Score <= 25 (OFSET) Patient Alicia Roach was identified by Dr. Gudena as a potential candidate for the above listed study.  This Clinical Research Nurse met with Alicia Roach, [id number], on 09/24/23 in a manner and location that ensures patient privacy to discuss participation in the above listed research study.  Patient is Accompanied by her mother .  A copy of the informed consent document and separate HIPAA Authorization was provided to the patient.  Patient reads, speaks, and understands English.   Patient was provided with the business card of this Nurse and encouraged to contact the research team with any questions.  Approximately 15 minutes were spent with the patient reviewing the informed consent documents.  Patient was provided the option of taking informed consent documents home to review and was encouraged to review at their convenience with their support network, including other care providers. Patient took the consent documents home to review. As outlined in the informed consent form, this Nurse and Akaya M Thurow discussed the purpose of the research study, the investigational nature of the study, study procedures and requirements for study participation, potential risks and benefits of study participation, as well as alternatives to participation. This study is not blinded. The patient understands participation is voluntary and they may withdraw from study participation at any time.  Each study arm was reviewed, and randomization discussed.  This study does not involve a placebo. Patient understands enrollment is pending full eligibility review. Patient does not yet have Oncotype results, and understands she must meet specific Oncotype  criteria to be a candidate for this trial. Confidentiality and how the patient's information will be used as part of study participation were discussed. The patient is encouraged to discuss research study participation with their insurance provider to determine what costs they may incur as part of study participation. A member of the research team is to follow up with Alicia Roach in approximately 10 days as requested.  Harlene Curly, RN, BSN, [facility] 09/24/2023 5:11 PM

## 2023-09-26 ENCOUNTER — Telehealth: Payer: Self-pay | Admitting: Hematology and Oncology

## 2023-09-26 ENCOUNTER — Encounter: Payer: Self-pay | Admitting: Hematology and Oncology

## 2023-09-26 ENCOUNTER — Other Ambulatory Visit: Payer: Self-pay | Admitting: General Surgery

## 2023-09-26 ENCOUNTER — Other Ambulatory Visit: Payer: Self-pay | Admitting: Hematology and Oncology

## 2023-09-26 ENCOUNTER — Other Ambulatory Visit: Payer: Self-pay | Admitting: *Deleted

## 2023-09-26 DIAGNOSIS — C50312 Malignant neoplasm of lower-inner quadrant of left female breast: Secondary | ICD-10-CM

## 2023-09-26 MED ORDER — DEXAMETHASONE 4 MG PO TABS: ORAL_TABLET | ORAL | 0 refills | Status: DC

## 2023-09-26 MED ORDER — PROCHLORPERAZINE MALEATE 10 MG PO TABS: 10.0000 mg | ORAL_TABLET | Freq: Four times a day (QID) | ORAL | 1 refills | Status: DC | PRN

## 2023-09-26 MED ORDER — ONDANSETRON HCL 8 MG PO TABS: ORAL_TABLET | ORAL | 1 refills | Status: DC

## 2023-09-26 MED ORDER — LIDOCAINE-PRILOCAINE 2.5-2.5 % EX CREA: TOPICAL_CREAM | CUTANEOUS | 3 refills | Status: DC

## 2023-09-26 NOTE — Telephone Encounter (Signed)
 Scheduled appointment per scheduling message. Patient is aware of the made appointment and is active on MyChart.

## 2023-09-26 NOTE — Progress Notes (Signed)
 START ON PATHWAY REGIMEN - Breast     Cycles 1 through 4: A cycle is every 14 days:     Doxorubicin       Cyclophosphamide       Pegfilgrastim -xxxx    Cycles 5 through 16: A cycle is every 7 days:     Paclitaxel    **Always confirm dose/schedule in your pharmacy ordering system**  Patient Characteristics: Postoperative without Neoadjuvant Therapy, M0 (Pathologic Staging), Invasive Disease, Adjuvant Therapy, HER2 Negative, ER Positive, Node Positive, Node Positive (1-3), Genomic Testing Not Performed, Chemotherapy Candidate Therapeutic Status: Postoperative without Neoadjuvant Therapy, M0 (Pathologic Staging) AJCC Grade: G1 AJCC N Category: pN1 AJCC M Category: cM0 ER Status: Positive (+) AJCC 8 Stage Grouping: IA HER2 Status: Negative (-) Oncotype Dx Recurrence Score: Not Appropriate AJCC T Category: pT2 PR Status: Positive (+) Has this patient completed genomic testing<= No - Did Not Order Test  Intent of Therapy: Curative Intent, Discussed with Patient

## 2023-09-27 ENCOUNTER — Other Ambulatory Visit: Payer: Self-pay

## 2023-09-30 ENCOUNTER — Other Ambulatory Visit: Payer: Self-pay

## 2023-09-30 ENCOUNTER — Telehealth: Payer: Self-pay

## 2023-09-30 ENCOUNTER — Encounter: Payer: Self-pay | Admitting: *Deleted

## 2023-09-30 ENCOUNTER — Other Ambulatory Visit: Payer: Self-pay | Admitting: Hematology and Oncology

## 2023-09-30 ENCOUNTER — Encounter (HOSPITAL_BASED_OUTPATIENT_CLINIC_OR_DEPARTMENT_OTHER): Payer: Self-pay | Admitting: General Surgery

## 2023-09-30 NOTE — Telephone Encounter (Signed)
 S/w pt regarding time off. She requests to be off 10/03/23-12/24/23 for the completion of chemo. Letter faxed to her employer with provided attached cover sheet. Fax confirmation received.

## 2023-10-01 ENCOUNTER — Other Ambulatory Visit: Payer: Self-pay | Admitting: Hematology and Oncology

## 2023-10-01 ENCOUNTER — Encounter: Payer: Self-pay | Admitting: *Deleted

## 2023-10-02 ENCOUNTER — Telehealth: Payer: Self-pay | Admitting: Hematology and Oncology

## 2023-10-02 ENCOUNTER — Encounter: Payer: Self-pay | Admitting: Hematology and Oncology

## 2023-10-02 ENCOUNTER — Other Ambulatory Visit: Payer: Self-pay

## 2023-10-02 NOTE — Telephone Encounter (Signed)
Called patient to confirm scheduled appointments. Patient is active on MyChart.

## 2023-10-03 ENCOUNTER — Inpatient Hospital Stay: Payer: No Typology Code available for payment source | Admitting: Pharmacist

## 2023-10-03 ENCOUNTER — Inpatient Hospital Stay: Payer: No Typology Code available for payment source

## 2023-10-03 ENCOUNTER — Ambulatory Visit (HOSPITAL_COMMUNITY)
Admission: RE | Admit: 2023-10-03 | Discharge: 2023-10-03 | Disposition: A | Discharge status: Home / Self Care | Payer: No Typology Code available for payment source | Source: Ambulatory Visit | Attending: Hematology and Oncology | Admitting: Hematology and Oncology

## 2023-10-03 DIAGNOSIS — Z87891 Personal history of nicotine dependence: Secondary | ICD-10-CM | POA: Insufficient documentation

## 2023-10-03 DIAGNOSIS — Z170 Estrogen receptor positive status [ER+]: Secondary | ICD-10-CM | POA: Insufficient documentation

## 2023-10-03 DIAGNOSIS — C50312 Malignant neoplasm of lower-inner quadrant of left female breast: Secondary | ICD-10-CM | POA: Insufficient documentation

## 2023-10-03 DIAGNOSIS — Z0189 Encounter for other specified special examinations: Secondary | ICD-10-CM | POA: Diagnosis not present

## 2023-10-03 LAB — ECHOCARDIOGRAM COMPLETE
AR max vel: 1.86 cm2
AV Area VTI: 1.9 cm2
AV Area mean vel: 1.82 cm2
AV Mean grad: 3 mm[Hg]
AV Peak grad: 5.1 mm[Hg]
Ao pk vel: 1.13 m/s
Area-P 1/2: 4.41 cm2
S' Lateral: 3.1 cm

## 2023-10-03 MED ORDER — CHLORHEXIDINE GLUCONATE CLOTH 2 % EX PADS: 6.0000 | MEDICATED_PAD | Freq: Once | CUTANEOUS | Status: DC

## 2023-10-03 MED ORDER — CHLORHEXIDINE GLUCONATE CLOTH 2 % EX PADS
6.0000 | MEDICATED_PAD | Freq: Once | CUTANEOUS | Status: DC
Start: 2023-10-03 — End: 2023-10-07

## 2023-10-03 NOTE — Progress Notes (Signed)
Surgical soap and ERAS drink given to patient, instructions given, patient verbalized understanding.

## 2023-10-07 ENCOUNTER — Ambulatory Visit (HOSPITAL_COMMUNITY): Payer: No Typology Code available for payment source

## 2023-10-07 ENCOUNTER — Encounter (HOSPITAL_BASED_OUTPATIENT_CLINIC_OR_DEPARTMENT_OTHER): Payer: Self-pay | Admitting: General Surgery

## 2023-10-07 ENCOUNTER — Ambulatory Visit (HOSPITAL_BASED_OUTPATIENT_CLINIC_OR_DEPARTMENT_OTHER)
Admission: RE | Admit: 2023-10-07 | Discharge: 2023-10-07 | Disposition: A | Discharge status: Home / Self Care | Payer: No Typology Code available for payment source | Attending: General Surgery | Admitting: General Surgery

## 2023-10-07 ENCOUNTER — Encounter (HOSPITAL_BASED_OUTPATIENT_CLINIC_OR_DEPARTMENT_OTHER)
Admission: RE | Disposition: A | Discharge status: Home / Self Care | Payer: Self-pay | Source: Home / Self Care | Attending: General Surgery

## 2023-10-07 ENCOUNTER — Other Ambulatory Visit: Payer: Self-pay

## 2023-10-07 ENCOUNTER — Ambulatory Visit (HOSPITAL_BASED_OUTPATIENT_CLINIC_OR_DEPARTMENT_OTHER): Payer: Self-pay | Admitting: Anesthesiology

## 2023-10-07 DIAGNOSIS — Z1721 Progesterone receptor positive status: Secondary | ICD-10-CM | POA: Insufficient documentation

## 2023-10-07 DIAGNOSIS — C50919 Malignant neoplasm of unspecified site of unspecified female breast: Secondary | ICD-10-CM

## 2023-10-07 DIAGNOSIS — C50912 Malignant neoplasm of unspecified site of left female breast: Secondary | ICD-10-CM | POA: Insufficient documentation

## 2023-10-07 DIAGNOSIS — E039 Hypothyroidism, unspecified: Secondary | ICD-10-CM | POA: Diagnosis not present

## 2023-10-07 DIAGNOSIS — Z1732 Human epidermal growth factor receptor 2 negative status: Secondary | ICD-10-CM | POA: Insufficient documentation

## 2023-10-07 DIAGNOSIS — Z01818 Encounter for other preprocedural examination: Secondary | ICD-10-CM

## 2023-10-07 DIAGNOSIS — F32A Depression, unspecified: Secondary | ICD-10-CM | POA: Diagnosis not present

## 2023-10-07 DIAGNOSIS — Z452 Encounter for adjustment and management of vascular access device: Secondary | ICD-10-CM | POA: Insufficient documentation

## 2023-10-07 DIAGNOSIS — Z170 Estrogen receptor positive status [ER+]: Secondary | ICD-10-CM | POA: Diagnosis not present

## 2023-10-07 DIAGNOSIS — F419 Anxiety disorder, unspecified: Secondary | ICD-10-CM | POA: Diagnosis not present

## 2023-10-07 DIAGNOSIS — Z87891 Personal history of nicotine dependence: Secondary | ICD-10-CM | POA: Diagnosis not present

## 2023-10-07 HISTORY — PX: PORTACATH PLACEMENT: SHX2246

## 2023-10-07 LAB — POCT PREGNANCY, URINE: Preg Test, Ur: NEGATIVE

## 2023-10-07 SURGERY — INSERTION, TUNNELED CENTRAL VENOUS DEVICE, WITH PORT
Anesthesia: General | Site: Chest | Laterality: Right

## 2023-10-07 MED ORDER — FENTANYL CITRATE (PF) 100 MCG/2ML IJ SOLN
INTRAMUSCULAR | Status: DC | PRN
  Administered 2023-10-07 (×2): 50 ug via INTRAVENOUS

## 2023-10-07 MED ORDER — BUPIVACAINE HCL (PF) 0.25 % IJ SOLN
INTRAMUSCULAR | Status: DC | PRN
  Administered 2023-10-07: 10 mL

## 2023-10-07 MED ORDER — FENTANYL CITRATE (PF) 100 MCG/2ML IJ SOLN
INTRAMUSCULAR | Status: AC
  Filled 2023-10-07: qty 2

## 2023-10-07 MED ORDER — ENSURE PRE-SURGERY PO LIQD: 296.0000 mL | Freq: Once | ORAL | Status: DC

## 2023-10-07 MED ORDER — LACTATED RINGERS IV SOLN: INTRAVENOUS | Status: DC

## 2023-10-07 MED ORDER — ONDANSETRON HCL 4 MG/2ML IJ SOLN
INTRAMUSCULAR | Status: AC
  Filled 2023-10-07: qty 2

## 2023-10-07 MED ORDER — MEPERIDINE HCL 25 MG/ML IJ SOLN: 6.2500 mg | INTRAMUSCULAR | Status: DC | PRN

## 2023-10-07 MED ORDER — FENTANYL CITRATE (PF) 100 MCG/2ML IJ SOLN: 25.0000 ug | INTRAMUSCULAR | Status: DC | PRN

## 2023-10-07 MED ORDER — HEPARIN SOD (PORK) LOCK FLUSH 100 UNIT/ML IV SOLN
INTRAVENOUS | Status: AC
  Filled 2023-10-07: qty 5

## 2023-10-07 MED ORDER — CEFAZOLIN SODIUM-DEXTROSE 2-4 GM/100ML-% IV SOLN
INTRAVENOUS | Status: AC
  Filled 2023-10-07: qty 100

## 2023-10-07 MED ORDER — HEPARIN SOD (PORK) LOCK FLUSH 100 UNIT/ML IV SOLN
INTRAVENOUS | Status: DC | PRN
  Administered 2023-10-07: 400 [IU] via INTRAVENOUS

## 2023-10-07 MED ORDER — PROPOFOL 1000 MG/100ML IV EMUL
INTRAVENOUS | Status: AC
Start: 2023-10-07 — End: ?
  Filled 2023-10-07: qty 100

## 2023-10-07 MED ORDER — MIDAZOLAM HCL 2 MG/2ML IJ SOLN
INTRAMUSCULAR | Status: AC
  Filled 2023-10-07: qty 2

## 2023-10-07 MED ORDER — MIDAZOLAM HCL 5 MG/5ML IJ SOLN
INTRAMUSCULAR | Status: DC | PRN
  Administered 2023-10-07: 2 mg via INTRAVENOUS

## 2023-10-07 MED ORDER — OXYCODONE HCL 5 MG/5ML PO SOLN: 5.0000 mg | Freq: Once | ORAL | Status: DC | PRN

## 2023-10-07 MED ORDER — PROPOFOL 10 MG/ML IV BOLUS
INTRAVENOUS | Status: DC | PRN
  Administered 2023-10-07: 200 mg via INTRAVENOUS
  Administered 2023-10-07: 50 mg via INTRAVENOUS
  Administered 2023-10-07: 175 ug/kg/min via INTRAVENOUS

## 2023-10-07 MED ORDER — ACETAMINOPHEN 160 MG/5ML PO SOLN: 325.0000 mg | ORAL | Status: DC | PRN

## 2023-10-07 MED ORDER — PHENYLEPHRINE 80 MCG/ML (10ML) SYRINGE FOR IV PUSH (FOR BLOOD PRESSURE SUPPORT)
PREFILLED_SYRINGE | INTRAVENOUS | Status: DC | PRN
  Administered 2023-10-07: 80 ug via INTRAVENOUS

## 2023-10-07 MED ORDER — LIDOCAINE 2% (20 MG/ML) 5 ML SYRINGE
INTRAMUSCULAR | Status: AC
  Filled 2023-10-07: qty 5

## 2023-10-07 MED ORDER — ONDANSETRON HCL 4 MG/2ML IJ SOLN
INTRAMUSCULAR | Status: DC | PRN
  Administered 2023-10-07: 4 mg via INTRAVENOUS

## 2023-10-07 MED ORDER — LIDOCAINE 2% (20 MG/ML) 5 ML SYRINGE
INTRAMUSCULAR | Status: DC | PRN
  Administered 2023-10-07: 100 mg via INTRAVENOUS

## 2023-10-07 MED ORDER — CEFAZOLIN SODIUM-DEXTROSE 2-4 GM/100ML-% IV SOLN
2.0000 g | INTRAVENOUS | Status: AC
  Administered 2023-10-07: 2 g via INTRAVENOUS

## 2023-10-07 MED ORDER — ACETAMINOPHEN 500 MG PO TABS
ORAL_TABLET | ORAL | Status: AC
  Filled 2023-10-07: qty 2

## 2023-10-07 MED ORDER — OXYCODONE HCL 5 MG PO TABS: 5.0000 mg | ORAL_TABLET | Freq: Once | ORAL | Status: DC | PRN

## 2023-10-07 MED ORDER — DEXMEDETOMIDINE HCL IN NACL 80 MCG/20ML IV SOLN
INTRAVENOUS | Status: DC | PRN
  Administered 2023-10-07: 8 ug via INTRAVENOUS

## 2023-10-07 MED ORDER — ONDANSETRON HCL 4 MG/2ML IJ SOLN: 4.0000 mg | Freq: Once | INTRAMUSCULAR | Status: DC | PRN

## 2023-10-07 MED ORDER — HEPARIN (PORCINE) IN NACL 1000-0.9 UT/500ML-% IV SOLN
INTRAVENOUS | Status: AC
  Filled 2023-10-07: qty 500

## 2023-10-07 MED ORDER — ACETAMINOPHEN 500 MG PO TABS
1000.0000 mg | ORAL_TABLET | ORAL | Status: AC
  Administered 2023-10-07: 1000 mg via ORAL

## 2023-10-07 MED ORDER — ACETAMINOPHEN 325 MG PO TABS: 325.0000 mg | ORAL_TABLET | ORAL | Status: DC | PRN

## 2023-10-07 MED ORDER — HEPARIN (PORCINE) IN NACL 2-0.9 UNITS/ML
INTRAMUSCULAR | Status: AC | PRN
  Administered 2023-10-07: 500 mL via INTRAVENOUS

## 2023-10-07 SURGICAL SUPPLY — 40 items
BAG DECANTER FOR FLEXI CONT (MISCELLANEOUS) ×1 IMPLANT
BENZOIN TINCTURE PRP APPL 2/3 (GAUZE/BANDAGES/DRESSINGS) ×1 IMPLANT
BLADE SURG 11 STRL SS (BLADE) ×1 IMPLANT
BLADE SURG 15 STRL LF DISP TIS (BLADE) ×1 IMPLANT
CANISTER SUCT 1200ML W/VALVE (MISCELLANEOUS) IMPLANT
CHLORAPREP W/TINT 26 (MISCELLANEOUS) ×1 IMPLANT
COVER BACK TABLE 60X90IN (DRAPES) ×1 IMPLANT
COVER MAYO STAND STRL (DRAPES) ×1 IMPLANT
DERMABOND ADVANCED .7 DNX12 (GAUZE/BANDAGES/DRESSINGS) ×1 IMPLANT
DRAPE C-ARM 42X72 X-RAY (DRAPES) ×1 IMPLANT
DRAPE LAPAROSCOPIC ABDOMINAL (DRAPES) ×1 IMPLANT
DRAPE UTILITY XL STRL (DRAPES) ×1 IMPLANT
DRSG TEGADERM 4X4.75 (GAUZE/BANDAGES/DRESSINGS) IMPLANT
ELECT COATED BLADE 2.86 ST (ELECTRODE) ×1 IMPLANT
ELECT REM PT RETURN 9FT ADLT (ELECTROSURGICAL) ×1 IMPLANT
ELECTRODE REM PT RTRN 9FT ADLT (ELECTROSURGICAL) ×1 IMPLANT
GAUZE 4X4 16PLY ~~LOC~~+RFID DBL (SPONGE) ×1 IMPLANT
GAUZE SPONGE 4X4 12PLY STRL LF (GAUZE/BANDAGES/DRESSINGS) ×1 IMPLANT
GLOVE BIO SURGEON STRL SZ7 (GLOVE) ×1 IMPLANT
GLOVE BIOGEL PI IND STRL 7.5 (GLOVE) ×1 IMPLANT
GOWN STRL REUS W/ TWL LRG LVL3 (GOWN DISPOSABLE) ×2 IMPLANT
KIT CVR 48X5XPRB PLUP LF (MISCELLANEOUS) IMPLANT
KIT PORT POWER 8FR ISP CVUE (Port) IMPLANT
NDL HYPO 25X1 1.5 SAFETY (NEEDLE) ×1 IMPLANT
NDL SAFETY ECLIPSE 18X1.5 (NEEDLE) IMPLANT
NEEDLE HYPO 25X1 1.5 SAFETY (NEEDLE) ×1 IMPLANT
PACK BASIN DAY SURGERY FS (CUSTOM PROCEDURE TRAY) ×1 IMPLANT
PENCIL SMOKE EVACUATOR (MISCELLANEOUS) ×1 IMPLANT
SLEEVE SCD COMPRESS KNEE MED (STOCKING) ×1 IMPLANT
SPIKE FLUID TRANSFER (MISCELLANEOUS) IMPLANT
STRIP CLOSURE SKIN 1/2X4 (GAUZE/BANDAGES/DRESSINGS) ×1 IMPLANT
SUT MNCRL AB 4-0 PS2 18 (SUTURE) ×1 IMPLANT
SUT PROLENE 2 0 SH DA (SUTURE) ×1 IMPLANT
SUT SILK 2 0 TIES 17X18 (SUTURE) IMPLANT
SUT VIC AB 3-0 SH 27X BRD (SUTURE) ×1 IMPLANT
SYR 5ML LUER SLIP (SYRINGE) ×1 IMPLANT
SYR CONTROL 10ML LL (SYRINGE) ×1 IMPLANT
TOWEL GREEN STERILE FF (TOWEL DISPOSABLE) ×1 IMPLANT
TUBE CONNECTING 20X1/4 (TUBING) IMPLANT
YANKAUER SUCT BULB TIP NO VENT (SUCTIONS) IMPLANT

## 2023-10-07 NOTE — Interval H&P Note (Signed)
 History and Physical Interval Note:  10/07/2023 12:29 PM  Alicia Roach  has presented today for surgery, with the diagnosis of BREAST CANCER.  The various methods of treatment have been discussed with the patient and family. After consideration of risks, benefits and other options for treatment, the patient has consented to  Procedure(s): INSERTION PORT-A-CATH WITH ULTRASOUND GUIDANCE (N/A) as a surgical intervention.  The patient's history has been reviewed, patient examined, no change in status, stable for surgery.  I have reviewed the patient's chart and labs.  Questions were answered to the patient's satisfaction.     Emelia Loron

## 2023-10-07 NOTE — Progress Notes (Signed)
 Pharmacist Chemotherapy Monitoring - Initial Assessment    Anticipated start date: 10/15/23   The following has been reviewed per standard work regarding the patient's treatment regimen: The patient's diagnosis, treatment plan and drug doses, and organ/hematologic function Lab orders and baseline tests specific to treatment regimen  The treatment plan start date, drug sequencing, and pre-medications Prior authorization status  Patient's documented medication list, including drug-drug interaction screen and prescriptions for anti-emetics and supportive care specific to the treatment regimen The drug concentrations, fluid compatibility, administration routes, and timing of the medications to be used The patient's access for treatment and lifetime cumulative dose history, if applicable  The patient's medication allergies and previous infusion related reactions, if applicable   Changes made to treatment plan:  N/A  Follow up needed:  Pending authorization for treatment    Drusilla Kanner, PharmD, MBA

## 2023-10-07 NOTE — Anesthesia Preprocedure Evaluation (Addendum)
 Anesthesia Evaluation  Patient identified by MRN, date of birth, ID band Patient awake    Reviewed: Allergy & Precautions, NPO status , Patient's Chart, lab work & pertinent test results  History of Anesthesia Complications (+) PONV and history of anesthetic complications  Airway Mallampati: I  TM Distance: >3 FB Neck ROM: Full    Dental  (+) Dental Advisory Given, Teeth Intact, Chipped,    Pulmonary neg shortness of breath, neg sleep apnea, neg COPD, neg recent URI, former smoker   breath sounds clear to auscultation       Cardiovascular negative cardio ROS  Rhythm:Regular     Neuro/Psych  PSYCHIATRIC DISORDERS Anxiety Depression       GI/Hepatic negative GI ROS, Neg liver ROS,,,  Endo/Other  Hypothyroidism    Renal/GU negative Renal ROS     Musculoskeletal   Abdominal   Peds  Hematology   Anesthesia Other Findings   Reproductive/Obstetrics                             Anesthesia Physical Anesthesia Plan  ASA: 2  Anesthesia Plan: General   Post-op Pain Management: Minimal or no pain anticipated   Induction: Intravenous  PONV Risk Score and Plan: 4 or greater and Ondansetron and TIVA  Airway Management Planned: LMA  Additional Equipment: None  Intra-op Plan:   Post-operative Plan: Extubation in OR  Informed Consent: I have reviewed the patients History and Physical, chart, labs and discussed the procedure including the risks, benefits and alternatives for the proposed anesthesia with the patient or authorized representative who has indicated his/her understanding and acceptance.     Dental advisory given  Plan Discussed with: CRNA and Anesthesiologist  Anesthesia Plan Comments:         Anesthesia Quick Evaluation

## 2023-10-07 NOTE — Discharge Instructions (Addendum)
 PORT-A-CATH: POST OP INSTRUCTIONS  Always review your discharge instruction sheet given to you by the facility where your surgery was performed.   A prescription for pain medication may be given to you upon discharge. Take your pain medication as prescribed, if needed. If narcotic pain medicine is not needed, then you make take acetaminophen (Tylenol) or ibuprofen (Advil) as needed.  Take your usually prescribed medications unless otherwise directed. If you need a refill on your pain medication, please contact our office. All narcotic pain medicine now requires a paper prescription.  Phoned in and fax refills are no longer allowed by law.  Prescriptions will not be filled after 5 pm or on weekends.  You should follow a light diet for the remainder of the day after your procedure. Most patients will experience some mild swelling and/or bruising in the area of the incision. It may take several days to resolve. It is common to experience some constipation if taking pain medication after surgery. Increasing fluid intake and taking a stool softener (such as Colace) will usually help or prevent this problem from occurring. A mild laxative (Milk of Magnesia or Miralax) should be taken according to package directions if there are no bowel movements after 48 hours.  Unless discharge instructions indicate otherwise, you may remove your bandages 48 hours after surgery, and you may shower at that time. You may have steri-strips (small white skin tapes) in place directly over the incision.  These strips should be left on the skin for 7-10 days.  If your surgeon used Dermabond (skin glue) on the incision, you may shower in 24 hours.  The glue will flake off over the next 2-3 weeks.  If your port is left accessed at the end of surgery (needle left in port), the dressing cannot get wet and should only by changed by a healthcare professional. When the port is no longer accessed (when the needle has been removed),  follow step 7.   ACTIVITIES:  Limit activity involving your arms for the next 72 hours. Do no strenuous exercise or activity for 1 week. You may drive when you are no longer taking prescription pain medication, you can comfortably wear a seatbelt, and you can maneuver your car. 10.You may need to see your doctor in the office for a follow-up appointment.  Please       check with your doctor.  11.When you receive a new Port-a-Cath, you will get a product guide and        ID card.  Please keep them in case you need them.  WHEN TO CALL YOUR DOCTOR [phone]): Fever over 101.0 Chills Continued bleeding from incision Increased redness and tenderness at the site Shortness of breath, difficulty breathing   The clinic staff is available to answer your questions during regular business hours. Please don't hesitate to call and ask to speak to one of the nurses or medical assistants for clinical concerns. If you have a medical emergency, go to the nearest emergency room or call 911.  A surgeon from [facility] is always on call at the hospital.     For further information, please visit [email or web address]     Post Anesthesia Home Care Instructions  Activity: Get plenty of rest for the remainder of the day. A responsible individual must stay with you for 24 hours following the procedure.  For the next 24 hours, DO NOT: -Drive a car -Advertising copywriter -Drink alcoholic beverages -Take any medication unless instructed by your  physician -Make any legal decisions or sign important papers.  Meals: Start with liquid foods such as gelatin or soup. Progress to regular foods as tolerated. Avoid greasy, spicy, heavy foods. If nausea and/or vomiting occur, drink only clear liquids until the nausea and/or vomiting subsides. Call your physician if vomiting continues.  Special Instructions/Symptoms: Your throat may feel dry or sore from the anesthesia or the breathing tube  placed in your throat during surgery. If this causes discomfort, gargle with warm salt water. The discomfort should disappear within 24 hours.  If you had a scopolamine patch placed behind your ear for the management of post- operative nausea and/or vomiting:  1. The medication in the patch is effective for 72 hours, after which it should be removed.  Wrap patch in a tissue and discard in the trash. Wash hands thoroughly with soap and water. 2. You may remove the patch earlier than 72 hours if you experience unpleasant side effects which may include dry mouth, dizziness or visual disturbances. 3. Avoid touching the patch. Wash your hands with soap and water after contact with the patch.      Next dose of tylenol may be taken at 6p

## 2023-10-07 NOTE — Op Note (Signed)
 Preoperative diagnosis: s/p lumpectomy/sn biopsy, pos sn node Postoperative diagnosis: Same as above Procedure: Right internal jugular vein port placement with ultrasound guidance Surgeon: Dr. Harden Mo Anesthesia: General Specimens: None Complications: None Estimated blood loss: Minimal Sponge count was correct completion Disposition recovery stable condition   Indications: 71 yof s/p lumpectomy/sn biopsy needs port for chemotherapy.  Procedure: After informed consent was obtained she was taken to the operating room.  She was given antibiotics.  SCDs were in place.  She was placed under general anesthesia without complication.  She was prepped and draped in a standard sterile surgical fashion.  A surgical timeout was then performed.   I located the internal jugular vein with ultrasound.  I then made a small nick in the skin overlying it.  I accessed the vein under ultrasound guidance with a needle.  This was clearly in the jugular vein and aspirated blood.  I placed the wire.  This was confirmed to be in position by fluoroscopy as well as by ultrasound to be in the vein.  I then infiltrated Marcaine on her chest.  I made an incision and created a pocket for the port.  I tunneled the line between the 2 sites.  I then inserted the dilator over the wire.  Under fluoroscopic guidance I then placed the dilator and remove the wire assembly.  I then placed the line through the  sheath.  The sheath was then removed.  The line was pulled back to be in the distal vena cava near the cavoatrial junction.  The line is in place and is ready for use.  I then attached the port and placed it in the pocket.  I sutured this into position with a Prolene suture.  I then aspirated blood and flushed it easily.  I placed heparin in the port.  I closed this with 3-0 Vicryl and 4-0 Monocryl.  Glue and a Steri-Strip were applied.  She tolerated this well was extubated and transferred recovery stable.

## 2023-10-07 NOTE — Transfer of Care (Signed)
 Immediate Anesthesia Transfer of Care Note  Patient: Alicia Roach  Procedure(s) Performed: INSERTION PORT-A-CATH WITH ULTRASOUND GUIDANCE (Right: Chest)  Patient Location: PACU  Anesthesia Type:General  Level of Consciousness: drowsy  Airway & Oxygen Therapy: Patient Spontanous Breathing and Patient connected to face mask oxygen  Post-op Assessment: Report given to RN and Post -op Vital signs reviewed and stable  Post vital signs: Reviewed and stable  Last Vitals:  Vitals Value Taken Time  BP 106/58 10/07/23 1400  Temp    Pulse 87 10/07/23 1401  Resp 11 10/07/23 1401  SpO2 99 % 10/07/23 1401  Vitals shown include unfiled device data.  Last Pain:  Vitals:   10/07/23 1143  TempSrc: Oral  PainSc: 0-No pain      Patients Stated Pain Goal: 6 (10/07/23 1143)  Complications: No notable events documented.

## 2023-10-07 NOTE — Anesthesia Procedure Notes (Signed)
 Procedure Name: LMA Insertion Date/Time: 10/07/2023 1:15 PM  Performed by: Yolanda Bonine, CRNAPre-anesthesia Checklist: Patient identified, Emergency Drugs available, Suction available, Patient being monitored and Timeout performed Patient Re-evaluated:Patient Re-evaluated prior to induction Oxygen Delivery Method: Circle system utilized Preoxygenation: Pre-oxygenation with 100% oxygen Induction Type: IV induction Ventilation: Mask ventilation without difficulty LMA: LMA inserted LMA Size: 4.0 Number of attempts: 1 Placement Confirmation: positive ETCO2 and breath sounds checked- equal and bilateral Dental Injury: Teeth and Oropharynx as per pre-operative assessment

## 2023-10-07 NOTE — Anesthesia Postprocedure Evaluation (Signed)
 Anesthesia Post Note  Patient: Alicia Roach  Procedure(s) Performed: INSERTION PORT-A-CATH WITH ULTRASOUND GUIDANCE (Right: Chest)     Patient location during evaluation: PACU Anesthesia Type: General Level of consciousness: awake and alert Pain management: pain level controlled Vital Signs Assessment: post-procedure vital signs reviewed and stable Respiratory status: spontaneous breathing, nonlabored ventilation, respiratory function stable and patient connected to nasal cannula oxygen Cardiovascular status: blood pressure returned to baseline and stable Postop Assessment: no apparent nausea or vomiting Anesthetic complications: no   No notable events documented.  Last Vitals:  Vitals:   10/07/23 1445 10/07/23 1500  BP: 127/80 118/80  Pulse: 72 74  Resp: 12 17  Temp:    SpO2: 96% 95%    Last Pain:  Vitals:   10/07/23 1500  TempSrc:   PainSc: 0-No pain                 Makell Drohan

## 2023-10-08 ENCOUNTER — Ambulatory Visit: Payer: No Typology Code available for payment source

## 2023-10-08 ENCOUNTER — Other Ambulatory Visit: Payer: No Typology Code available for payment source

## 2023-10-08 ENCOUNTER — Encounter (HOSPITAL_BASED_OUTPATIENT_CLINIC_OR_DEPARTMENT_OTHER): Payer: Self-pay | Admitting: General Surgery

## 2023-10-09 ENCOUNTER — Inpatient Hospital Stay: Payer: No Typology Code available for payment source

## 2023-10-09 ENCOUNTER — Encounter: Payer: Self-pay | Admitting: *Deleted

## 2023-10-09 ENCOUNTER — Inpatient Hospital Stay: Payer: No Typology Code available for payment source | Admitting: Pharmacist

## 2023-10-09 DIAGNOSIS — Z5111 Encounter for antineoplastic chemotherapy: Secondary | ICD-10-CM | POA: Diagnosis not present

## 2023-10-09 DIAGNOSIS — Z170 Estrogen receptor positive status [ER+]: Secondary | ICD-10-CM

## 2023-10-09 DIAGNOSIS — C50312 Malignant neoplasm of lower-inner quadrant of left female breast: Secondary | ICD-10-CM

## 2023-10-09 NOTE — Research (Signed)
 DCP-001: Use of a Clinical Trial Screening Tool to Address Cancer Health Disparities in the [facility] Oncology Research Program [facility])   Patient Alicia Roach was identified by Dr. Pamelia Hoit as a potential candidate for the above listed study.  This Clinical Research Coordinator met with Alicia Roach, [id number], on 10/09/23 in a manner and location that ensures patient privacy to discuss participation in the above listed research study.  Patient is Accompanied by her mother and boyfriend .  A copy of the informed consent document and separate HIPAA Authorization was provided to the patient.  Patient reads, speaks, and understands [place].   Approximately 15 minutes were spent with the patient reviewing the informed consent documents.  Patient was provided the option of taking informed consent documents home to review and was encouraged to review at their convenience with their support network, including other care providers. Patient took the consent documents home to review. The patient agreed to read the consent and research coordinator will follow up with pt on Tues. 10/15/23 to answer any questions about this data collection study. The pt was thanked for her time and consideration of this trial.  Cooper Render, MPH  Clinical Research Coordinator

## 2023-10-09 NOTE — Progress Notes (Signed)
 [facility]       Telephone: [phone]?Fax: [phone]   Oncology Clinical Pharmacist Practitioner Initial Assessment  Alicia Roach is a 48 y.o. female with a diagnosis of breast cancer. They were contacted today via in-person visit. She is accompanied by her boyfriend Alicia Roach and her mother Alicia Roach.  Indication/Regimen Doxorubicin (Adriamycin) and cyclophosphamide (Cytoxan) followed by paclitaxel (Taxol) are being used appropriately for treatment of breast cancer by Dr. Serena Roach.      Wt Readings from Last 1 Encounters:  10/07/23 160 lb 4.4 oz (72.7 kg)    Estimated body surface area is 1.84 meters squared as calculated from the following:   Height as of 10/07/23: 5\' 6"  (1.676 m).   Weight as of 10/07/23: 160 lb 4.4 oz (72.7 kg).  The dosing regimen is every 14 days for 4 cycles  Doxorubicin (60 mg/m2) on Day 1 Cyclophosphamide (600 mg/m2) on Day 1 Pegfilgrastim (6 mg) on Day 3  Followed by a dosing regimen that is every 7 days for 12 cycles  Paclitaxel (80 mg/m2) on Day 1  It is planned to continue until treatment plan completion or unacceptable toxicity. The tentative start date is: 10/15/23  Dose Modifications Per Dr. Pamelia Roach, no dose modifications for chemotherapy at this time   Allergies Allergies  Allergen Reactions   Methimazole Rash    Vitals: No vitals or labs were done today for this chemotherapy education visit     10/07/2023    3:29 PM 10/07/2023    3:00 PM 10/07/2023    2:45 PM  Oncology Vitals  Temp 97.9 F (36.6 C)    Pulse Rate 69 74 72  BP 112/71 118/80 127/80  Resp 18 17 12   SpO2 97 % 95 % 96 %     Laboratory Data    Latest Ref Rng & Units 08/28/2023   12:02 PM 10/12/2011    5:39 AM 10/11/2011    5:15 PM  CBC EXTENDED  WBC 4.0 - 10.5 K/uL 9.8  12.4  11.5   RBC 3.87 - 5.11 MIL/uL 4.86  3.41  4.17   Hemoglobin 12.0 - 15.0 g/dL 29.5  62.1  30.8   HCT 36.0 - 46.0 % 44.6  31.6  38.1   Platelets 150 - 400 K/uL 266   158  208   NEUT# 1.7 - 7.7 K/uL 6.3     Lymph# 0.7 - 4.0 K/uL 2.6          Latest Ref Rng & Units 08/28/2023   12:02 PM 10/18/2009   11:36 PM  CMP  Glucose 70 - 99 mg/dL 75  657   BUN 6 - 20 mg/dL 22  15   Creatinine 8.46 - 1.00 mg/dL 9.62  9.52   Sodium 841 - 145 mmol/L 141  140   Potassium 3.5 - 5.1 mmol/L 4.2  4.2   Chloride 98 - 111 mmol/L 106  106   CO2 22 - 32 mmol/L 27  27   Calcium 8.9 - 10.3 mg/dL 9.6  9.3   Total Protein 6.5 - 8.1 g/dL 7.4  7.2   Total Bilirubin 0.0 - 1.2 mg/dL 0.3  0.3   Alkaline Phos 38 - 126 U/L 63  48   AST 15 - 41 U/L 14  18   ALT 0 - 44 U/L 7  13    Contraindications Contraindications were reviewed? Yes Contraindications to therapy were identified? No   Safety Precautions The following safety precautions were reviewed:  Fever: reviewed the importance of having a thermometer and the Centers for Disease Control and Prevention (CDC) definition of fever which is 100.39F (38C) or higher. Patient should call 24/7 triage at [phone] if experiencing a fever or any other symptoms Decreased white blood cells (WBCs) and increased risk for infection Decreased platelet count and increased risk of bleeding Decreased hemoglobin, part of the red blood cells that carry iron and oxygen Change in the color of urine Fatigue Hair loss Nausea or vomiting Mouth irritation or sores Doxorubicin (vesicant) Cardiotoxicity Hemorrhagic cystitis Secondary cancers Muscle or joint pain or weakness Peripheral neuropathy Hypersensitivity reactions Avoid grapefruit products Intimacy, sexual activity, contraception, fertility Handling body fluids and waste  Medication Reconciliation Current Outpatient Medications  Medication Sig Dispense Refill   B Complex Vitamins (B COMPLEX 100 PO) Take 1 tablet by mouth daily.     cholecalciferol (VITAMIN D3) 25 MCG (1000 UNIT) tablet Take 1,000 Units by mouth daily.     Collagen-Vitamin C-Biotin (COLLAGEN PO) Take 2 Scoops by  mouth daily. Grass Fed Bovine Collagen Peptides     dexamethasone (DECADRON) 4 MG tablet Take 1 tablet by mouth daily for 2 days. Start the day after doxorubicin/cyclophosphamide chemotherapy. Take with food. 8 tablet 0   levothyroxine (SYNTHROID) 125 MCG tablet Take 125 mcg by mouth daily before breakfast.     lidocaine-prilocaine (EMLA) cream Apply to affected area once 30 g 3   Magnesium Citrate POWD Take 4 g by mouth daily. 4g = 1 scoop     N-ACETYL CYSTEINE PO Take 900 mg by mouth daily.     ondansetron (ZOFRAN) 8 MG tablet Take 1 tab (8 mg) by mouth every 8 hrs as needed for nausea/vomiting. Start third day after doxorubicin/cyclophosphamide chemotherapy. 30 tablet 1   OVER THE COUNTER MEDICATION Take 1 capsule by mouth daily. Rhodiola Rosea     OVER THE COUNTER MEDICATION Take 15 mLs by mouth daily. MCT Oil     oxyCODONE (OXY IR/ROXICODONE) 5 MG immediate release tablet Take 1 tablet (5 mg total) by mouth every 6 (six) hours as needed. 10 tablet 0   Prenatal Vit-Fe Fumarate-FA (PRENATAL MULTIVITAMIN) TABS tablet Take 1 tablet by mouth daily.     prochlorperazine (COMPAZINE) 10 MG tablet Take 1 tablet (10 mg total) by mouth every 6 (six) hours as needed for nausea or vomiting. 30 tablet 1   venlafaxine XR (EFFEXOR-XR) 150 MG 24 hr capsule Take 150 mg by mouth daily.     venlafaxine XR (EFFEXOR-XR) 75 MG 24 hr capsule Take 75 mg by mouth daily.     VRAYLAR 1.5 MG capsule Take 1.5 mg by mouth every other day.     No current facility-administered medications for this visit.   Medication reconciliation is based on the patient's most recent medication list in the electronic medical record (EMR) including herbal products and OTC medications.   The patient's medication list was reviewed today with the patient? Yes   Drug-drug interactions (DDIs) DDIs were evaluated? Yes Significant DDIs identified? No   Drug-Food Interactions Drug-food interactions were evaluated? Yes Drug-food  interactions identified?  She will avoid grapefruit products  Follow-up Plan  Treatment start date: 10/15/23 Port placement date: 10/07/23 ECHO date: 10/03/23 We reviewed the prescriptions, premedications, and treatment regimen with the patient. Possible side effects of the treatment regimen were reviewed and management strategies were discussed.  Can use loperamide as needed for diarrhea, loratadine as needed for G-CSF bone pain, and Senna-S as needed for constipation.  We discussed trying to limit herbal supplements (not regulated by FDA) and antioxidants while on chemotherapy. Link provided discussing antioxidants while on chemotherapy: American Cancer Society article: https://www.cancer.org/cancer/latest-news/study-finds-antioxidants-risky-during-breast-cancer-chemotherapy.html Clinical pharmacy will assist Dr. Serena Roach and Carola Frost Pajak on an as needed basis going forward  Carola Frost Auston participated in the discussion, expressed understanding, and voiced agreement with the above plan. All questions were answered to her satisfaction. The patient was advised to contact the clinic at [phone] with any questions or concerns prior to her return visit.   I spent 60 minutes assessing the patient.  Tessah Patchen A. Odetta Pink, PharmD, BCOP, CPP  Anselm Lis, RPH-CPP, 10/09/2023 9:57 AM  **Disclaimer: This note was dictated with voice recognition software. Similar sounding words can inadvertently be transcribed and this note may contain transcription errors which may not have been corrected upon publication of note.**

## 2023-10-10 ENCOUNTER — Encounter: Payer: Self-pay | Admitting: Hematology and Oncology

## 2023-10-10 ENCOUNTER — Ambulatory Visit: Payer: No Typology Code available for payment source

## 2023-10-11 ENCOUNTER — Encounter: Payer: Self-pay | Admitting: Hematology and Oncology

## 2023-10-11 ENCOUNTER — Encounter: Payer: Self-pay | Admitting: *Deleted

## 2023-10-12 ENCOUNTER — Other Ambulatory Visit: Payer: Self-pay

## 2023-10-14 ENCOUNTER — Telehealth: Payer: Self-pay

## 2023-10-14 MED FILL — Fosaprepitant Dimeglumine For IV Infusion 150 MG (Base Eq): INTRAVENOUS | Qty: 5 | Status: AC

## 2023-10-14 NOTE — Telephone Encounter (Signed)
 Notified the pt to let her know that her Disability Forms were completed and faxed.She wanted her copy to be email to her. I emailed 10/14/2023.

## 2023-10-15 ENCOUNTER — Inpatient Hospital Stay: Payer: No Typology Code available for payment source

## 2023-10-15 ENCOUNTER — Encounter: Payer: Self-pay | Admitting: *Deleted

## 2023-10-15 ENCOUNTER — Other Ambulatory Visit: Payer: No Typology Code available for payment source

## 2023-10-15 ENCOUNTER — Ambulatory Visit: Payer: No Typology Code available for payment source | Admitting: Hematology and Oncology

## 2023-10-15 ENCOUNTER — Inpatient Hospital Stay (HOSPITAL_BASED_OUTPATIENT_CLINIC_OR_DEPARTMENT_OTHER): Payer: No Typology Code available for payment source | Admitting: Hematology and Oncology

## 2023-10-15 VITALS — BP 122/83 | HR 99 | Temp 98.4°F | Resp 16

## 2023-10-15 VITALS — BP 127/77 | HR 80 | Temp 98.6°F | Resp 17 | Wt 158.4 lb

## 2023-10-15 DIAGNOSIS — C50312 Malignant neoplasm of lower-inner quadrant of left female breast: Secondary | ICD-10-CM

## 2023-10-15 DIAGNOSIS — Z5111 Encounter for antineoplastic chemotherapy: Secondary | ICD-10-CM | POA: Diagnosis not present

## 2023-10-15 DIAGNOSIS — Z170 Estrogen receptor positive status [ER+]: Secondary | ICD-10-CM

## 2023-10-15 DIAGNOSIS — Z95828 Presence of other vascular implants and grafts: Secondary | ICD-10-CM | POA: Insufficient documentation

## 2023-10-15 LAB — CMP (CANCER CENTER ONLY)
ALT: 32 U/L (ref 0–44)
AST: 26 U/L (ref 15–41)
Albumin: 4.3 g/dL (ref 3.5–5.0)
Alkaline Phosphatase: 78 U/L (ref 38–126)
Anion gap: 6 (ref 5–15)
BUN: 14 mg/dL (ref 6–20)
CO2: 27 mmol/L (ref 22–32)
Calcium: 9.8 mg/dL (ref 8.9–10.3)
Chloride: 106 mmol/L (ref 98–111)
Creatinine: 1.02 mg/dL — ABNORMAL HIGH (ref 0.44–1.00)
GFR, Estimated: >60 mL/min (ref >60)
Glucose, Bld: 87 mg/dL (ref 70–99)
Potassium: 4.4 mmol/L (ref 3.5–5.1)
Sodium: 139 mmol/L (ref 135–145)
Total Bilirubin: 0.3 mg/dL (ref 0.0–1.2)
Total Protein: 7.2 g/dL (ref 6.5–8.1)

## 2023-10-15 LAB — CBC WITH DIFFERENTIAL (CANCER CENTER ONLY)
Abs Immature Granulocytes: 0.02 10*3/uL (ref 0.00–0.07)
Basophils Absolute: 0.1 10*3/uL (ref 0.0–0.1)
Basophils Relative: 1 %
Eosinophils Absolute: 0.5 10*3/uL (ref 0.0–0.5)
Eosinophils Relative: 7 %
HCT: 42.1 % (ref 36.0–46.0)
Hemoglobin: 14.1 g/dL (ref 12.0–15.0)
Immature Granulocytes: 0 %
Lymphocytes Relative: 32 %
Lymphs Abs: 2.4 10*3/uL (ref 0.7–4.0)
MCH: 30.5 pg (ref 26.0–34.0)
MCHC: 33.5 g/dL (ref 30.0–36.0)
MCV: 91.1 fL (ref 80.0–100.0)
Monocytes Absolute: 0.5 10*3/uL (ref 0.1–1.0)
Monocytes Relative: 7 %
Neutro Abs: 4 10*3/uL (ref 1.7–7.7)
Neutrophils Relative %: 53 %
Platelet Count: 252 10*3/uL (ref 150–400)
RBC: 4.62 MIL/uL (ref 3.87–5.11)
RDW: 12.2 % (ref 11.5–15.5)
WBC Count: 7.5 10*3/uL (ref 4.0–10.5)
nRBC: 0 % (ref 0.0–0.2)

## 2023-10-15 LAB — PREGNANCY, URINE: Preg Test, Ur: NEGATIVE

## 2023-10-15 MED ORDER — SODIUM CHLORIDE 0.9 % IV SOLN: INTRAVENOUS | Status: DC

## 2023-10-15 MED ORDER — SODIUM CHLORIDE 0.9 % IV SOLN
600.0000 mg/m2 | Freq: Once | INTRAVENOUS | Status: AC
  Administered 2023-10-15: 1000 mg via INTRAVENOUS
  Filled 2023-10-15: qty 50

## 2023-10-15 MED ORDER — DOXORUBICIN HCL CHEMO IV INJECTION 2 MG/ML
60.0000 mg/m2 | Freq: Once | INTRAVENOUS | Status: AC
  Administered 2023-10-15: 108 mg via INTRAVENOUS
  Filled 2023-10-15: qty 54

## 2023-10-15 MED ORDER — PALONOSETRON HCL INJECTION 0.25 MG/5ML
0.2500 mg | Freq: Once | INTRAVENOUS | Status: AC
  Administered 2023-10-15: 0.25 mg via INTRAVENOUS
  Filled 2023-10-15: qty 5

## 2023-10-15 MED ORDER — DEXAMETHASONE SODIUM PHOSPHATE 10 MG/ML IJ SOLN
10.0000 mg | Freq: Once | INTRAMUSCULAR | Status: AC
  Administered 2023-10-15: 10 mg via INTRAVENOUS
  Filled 2023-10-15: qty 1

## 2023-10-15 MED ORDER — FOSAPREPITANT DIMEGLUMINE INJECTION 150 MG
150.0000 mg | Freq: Once | INTRAVENOUS | Status: AC
  Administered 2023-10-15: 150 mg via INTRAVENOUS
  Filled 2023-10-15: qty 150

## 2023-10-15 MED ORDER — SODIUM CHLORIDE 0.9% FLUSH: 10.0000 mL | INTRAVENOUS | Status: DC | PRN

## 2023-10-15 MED ORDER — SODIUM CHLORIDE 0.9% FLUSH
10.0000 mL | Freq: Once | INTRAVENOUS | Status: AC
Start: 2023-10-15 — End: 2023-10-15
  Administered 2023-10-15: 10 mL

## 2023-10-15 MED ORDER — HEPARIN SOD (PORK) LOCK FLUSH 100 UNIT/ML IV SOLN: 500.0000 [IU] | Freq: Once | INTRAVENOUS | Status: DC | PRN

## 2023-10-15 NOTE — Research (Signed)
 DCP-001: Use of a Clinical Trial Screening Tool to Address Cancer Health Disparities in the [facility] Research Program [facility])   Coordinator and Nurse met with patient about the DCP study, and her boyfriend. Patient said she read the consent and understood everything. Patient expressed interest in the study. She asked to sign consent at a later visit. Will visit patient during visit on 10/17/23 to potentially complete consent and HIPAA for this data collection study. The patient was thanked for her time and wished well for her upcoming treatment.  Cooper Render, MPH  Clinical Research Coordinator

## 2023-10-15 NOTE — Patient Instructions (Signed)
 [facility] - A DEPT OF [facility]  Discharge Instructions: Thank you for choosing [facility] to provide your oncology and hematology care.   If you have a lab appointment with the Cancer Center, please go directly to the Cancer Center and check in at the registration area.   Wear comfortable clothing and clothing appropriate for easy access to any Portacath or PICC line.   We strive to give you quality time with your provider. You may need to reschedule your appointment if you arrive late (15 or more minutes).  Arriving late affects you and other patients whose appointments are after yours.  Also, if you miss three or more appointments without notifying the office, you may be dismissed from the clinic at the provider's discretion.      For prescription refill requests, have your pharmacy contact our office and allow 72 hours for refills to be completed.    Today you received the following chemotherapy and/or immunotherapy agents Adriamycin, Cytoxan      To help prevent nausea and vomiting after your treatment, we encourage you to take your nausea medication as directed.  BELOW ARE SYMPTOMS THAT SHOULD BE REPORTED IMMEDIATELY: *FEVER GREATER THAN 100.4 F (38 C) OR HIGHER *CHILLS OR SWEATING *NAUSEA AND VOMITING THAT IS NOT CONTROLLED WITH YOUR NAUSEA MEDICATION *UNUSUAL SHORTNESS OF BREATH *UNUSUAL BRUISING OR BLEEDING *URINARY PROBLEMS (pain or burning when urinating, or frequent urination) *BOWEL PROBLEMS (unusual diarrhea, constipation, pain near the anus) TENDERNESS IN MOUTH AND THROAT WITH OR WITHOUT PRESENCE OF ULCERS (sore throat, sores in mouth, or a toothache) UNUSUAL RASH, SWELLING OR PAIN  UNUSUAL VAGINAL DISCHARGE OR ITCHING   Items with * indicate a potential emergency and should be followed up as soon as possible or go to the Emergency Department if any problems should occur.  Please show the CHEMOTHERAPY ALERT CARD or  IMMUNOTHERAPY ALERT CARD at check-in to the Emergency Department and triage nurse.  Should you have questions after your visit or need to cancel or reschedule your appointment, please contact [facility] - A DEPT OF [facility]  Dept: [phone]  and follow the prompts.  Office hours are 8:00 a.m. to 4:30 p.m. Monday - Friday. Please note that voicemails left after 4:00 p.m. may not be returned until the following business day.  We are closed weekends and major holidays. You have access to a nurse at all times for urgent questions. Please call the main number to the clinic Dept: [phone] and follow the prompts.   For any non-urgent questions, you may also contact your provider using MyChart. We now offer e-Visits for anyone 63 and older to request care online for non-urgent symptoms. For details visit [email or web address].   Also download the MyChart app! Go to the app store, search "MyChart", open the app, select [place], and log in with your MyChart username and password.  Doxorubicin Injection What is this medication? DOXORUBICIN (dox oh ROO bi sin) treats some types of cancer. It works by slowing down the growth of cancer cells. This medicine may be used for other purposes; ask your health care provider or pharmacist if you have questions. COMMON BRAND NAME(S): Adriamycin, Adriamycin PFS, Adriamycin RDF, Rubex What should I tell my care team before I take this medication? They need to know if you have any of these conditions: Heart disease History of low blood cell levels caused by a medication Liver  disease Recent or ongoing radiation An unusual or allergic reaction to doxorubicin, other medications, foods, dyes, or preservatives If you or your partner are pregnant or trying to get pregnant Breast-feeding How should I use this medication? This medication is injected into a vein. It is given by your care team in a hospital or clinic  setting. Talk to your care team about the use of this medication in children. Special care may be needed. Overdosage: If you think you have taken too much of this medicine contact a poison control center or emergency room at once. NOTE: This medicine is only for you. Do not share this medicine with others. What if I miss a dose? Keep appointments for follow-up doses. It is important not to miss your dose. Call your care team if you are unable to keep an appointment. What may interact with this medication? 6-mercaptopurine Paclitaxel Phenytoin St. John's wort Trastuzumab Verapamil This list may not describe all possible interactions. Give your health care provider a list of all the medicines, herbs, non-prescription drugs, or dietary supplements you use. Also tell them if you smoke, drink alcohol, or use illegal drugs. Some items may interact with your medicine. What should I watch for while using this medication? Your condition will be monitored carefully while you are receiving this medication. You may need blood work while taking this medication. This medication may make you feel generally unwell. This is not uncommon as chemotherapy can affect healthy cells as well as cancer cells. Report any side effects. Continue your course of treatment even though you feel ill unless your care team tells you to stop. There is a maximum amount of this medication you should receive throughout your life. The amount depends on the medical condition being treated and your overall health. Your care team will watch how much of this medication you receive. Tell your care team if you have taken this medication before. Your urine may turn red for a few days after your dose. This is not blood. If your urine is dark or brown, call your care team. In some cases, you may be given additional medications to help with side effects. Follow all directions for their use. This medication may increase your risk of getting an  infection. Call your care team for advice if you get a fever, chills, sore throat, or other symptoms of a cold or flu. Do not treat yourself. Try to avoid being around people who are sick. This medication may increase your risk to bruise or bleed. Call your care team if you notice any unusual bleeding. Talk to your care team about your risk of cancer. You may be more at risk for certain types of cancers if you take this medication. Talk to your care team if you or your partner may be pregnant. Serious birth defects can occur if you take this medication during pregnancy and for 6 months after the last dose. Contraception is recommended while taking this medication and for 6 months after the last dose. Your care team can help you find the option that works for you. If your partner can get pregnant, use a condom while taking this medication and for 6 months after the last dose. Do not breastfeed while taking this medication. This medication may cause infertility. Talk to your care team if you are concerned about your fertility. What side effects may I notice from receiving this medication? Side effects that you should report to your care team as soon as possible: Allergic reactions--skin rash,  itching, hives, swelling of the face, lips, tongue, or throat Heart failure--shortness of breath, swelling of the ankles, feet, or hands, sudden weight gain, unusual weakness or fatigue Heart rhythm changes--fast or irregular heartbeat, dizziness, feeling faint or lightheaded, chest pain, trouble breathing Infection--fever, chills, cough, sore throat, wounds that don't heal, pain or trouble when passing urine, general feeling of discomfort or being unwell Low red blood cell level--unusual weakness or fatigue, dizziness, headache, trouble breathing Painful swelling, warmth, or redness of the skin, blisters or sores at the infusion site Unusual bruising or bleeding Side effects that usually do not require medical  attention (report to your care team if they continue or are bothersome): Diarrhea Hair loss Nausea Pain, redness, or swelling with sores inside the mouth or throat Red urine This list may not describe all possible side effects. Call your doctor for medical advice about side effects. You may report side effects to FDA at 1-800-FDA-1088. Where should I keep my medication? This medication is given in a hospital or clinic. It will not be stored at home. NOTE: This sheet is a summary. It may not cover all possible information. If you have questions about this medicine, talk to your doctor, pharmacist, or health care provider.  2024 Elsevier/Gold Standard (2022-11-08 00:00:00)  Cyclophosphamide Injection What is this medication? CYCLOPHOSPHAMIDE (sye kloe FOSS fa mide) treats some types of cancer. It works by slowing down the growth of cancer cells. This medicine may be used for other purposes; ask your health care provider or pharmacist if you have questions. COMMON BRAND NAME(S): Cyclophosphamide, Cytoxan, Neosar What should I tell my care team before I take this medication? They need to know if you have any of these conditions: Heart disease Irregular heartbeat or rhythm Infection Kidney problems Liver disease Low blood cell levels (white cells, platelets, or red blood cells) Lung disease Previous radiation Trouble passing urine An unusual or allergic reaction to cyclophosphamide, other medications, foods, dyes, or preservatives Pregnant or trying to get pregnant Breast-feeding How should I use this medication? This medication is injected into a vein. It is given by your care team in a hospital or clinic setting. Talk to your care team about the use of this medication in children. Special care may be needed. Overdosage: If you think you have taken too much of this medicine contact a poison control center or emergency room at once. NOTE: This medicine is only for you. Do not share  this medicine with others. What if I miss a dose? Keep appointments for follow-up doses. It is important not to miss your dose. Call your care team if you are unable to keep an appointment. What may interact with this medication? Amphotericin B Amiodarone Azathioprine Certain antivirals for HIV or hepatitis Certain medications for blood pressure, such as enalapril, lisinopril, quinapril Cyclosporine Diuretics Etanercept Indomethacin Medications that relax muscles Metronidazole Natalizumab Tamoxifen Warfarin This list may not describe all possible interactions. Give your health care provider a list of all the medicines, herbs, non-prescription drugs, or dietary supplements you use. Also tell them if you smoke, drink alcohol, or use illegal drugs. Some items may interact with your medicine. What should I watch for while using this medication? This medication may make you feel generally unwell. This is not uncommon as chemotherapy can affect healthy cells as well as cancer cells. Report any side effects. Continue your course of treatment even though you feel ill unless your care team tells you to stop. You may need blood work while you  are taking this medication. This medication may increase your risk of getting an infection. Call your care team for advice if you get a fever, chills, sore throat, or other symptoms of a cold or flu. Do not treat yourself. Try to avoid being around people who are sick. Avoid taking medications that contain aspirin, acetaminophen, ibuprofen, naproxen, or ketoprofen unless instructed by your care team. These medications may hide a fever. Be careful brushing or flossing your teeth or using a toothpick because you may get an infection or bleed more easily. If you have any dental work done, tell your dentist you are receiving this medication. Drink water or other fluids as directed. Urinate often, even at night. Some products may contain alcohol. Ask your care team  if this medication contains alcohol. Be sure to tell all care teams you are taking this medicine. Certain medicines, like metronidazole and disulfiram, can cause an unpleasant reaction when taken with alcohol. The reaction includes flushing, headache, nausea, vomiting, sweating, and increased thirst. The reaction can last from 30 minutes to several hours. Talk to your care team if you wish to become pregnant or think you might be pregnant. This medication can cause serious birth defects if taken during pregnancy and for 1 year after the last dose. A negative pregnancy test is required before starting this medication. A reliable form of contraception is recommended while taking this medication and for 1 year after the last dose. Talk to your care team about reliable forms of contraception. Do not father a child while taking this medication and for 4 months after the last dose. Use a condom during this time period. Do not breast-feed while taking this medication or for 1 week after the last dose. This medication may cause infertility. Talk to your care team if you are concerned about your fertility. Talk to your care team about your risk of cancer. You may be more at risk for certain types of cancer if you take this medication. What side effects may I notice from receiving this medication? Side effects that you should report to your care team as soon as possible: Allergic reactions--skin rash, itching, hives, swelling of the face, lips, tongue, or throat Dry cough, shortness of breath or trouble breathing Heart failure--shortness of breath, swelling of the ankles, feet, or hands, sudden weight gain, unusual weakness or fatigue Heart muscle inflammation--unusual weakness or fatigue, shortness of breath, chest pain, fast or irregular heartbeat, dizziness, swelling of the ankles, feet, or hands Heart rhythm changes--fast or irregular heartbeat, dizziness, feeling faint or lightheaded, chest pain, trouble  breathing Infection--fever, chills, cough, sore throat, wounds that don't heal, pain or trouble when passing urine, general feeling of discomfort or being unwell Kidney injury--decrease in the amount of urine, swelling of the ankles, hands, or feet Liver injury--right upper belly pain, loss of appetite, nausea, light-colored stool, dark yellow or brown urine, yellowing skin or eyes, unusual weakness or fatigue Low red blood cell level--unusual weakness or fatigue, dizziness, headache, trouble breathing Low sodium level--muscle weakness, fatigue, dizziness, headache, confusion Red or dark brown urine Unusual bruising or bleeding Side effects that usually do not require medical attention (report to your care team if they continue or are bothersome): Hair loss Irregular menstrual cycles or spotting Loss of appetite Nausea Pain, redness, or swelling with sores inside the mouth or throat Vomiting This list may not describe all possible side effects. Call your doctor for medical advice about side effects. You may report side effects to FDA at  1-800-FDA-1088. Where should I keep my medication? This medication is given in a hospital or clinic. It will not be stored at home. NOTE: This sheet is a summary. It may not cover all possible information. If you have questions about this medicine, talk to your doctor, pharmacist, or health care provider.  2024 Elsevier/Gold Standard (2021-12-22 00:00:00)

## 2023-10-15 NOTE — Progress Notes (Signed)
 Patient Care Team: Letta Kocher, PA-C as PCP - General (Family Medicine) Donnelly Angelica, RN as Oncology Nurse Navigator Pershing Proud, RN as Oncology Nurse Navigator Emelia Loron, MD as Consulting Physician (General Surgery) Serena Croissant, MD as Consulting Physician (Hematology and Oncology) Dorothy Puffer, MD as Consulting Physician (Radiation Oncology)  DIAGNOSIS:  Encounter Diagnosis  Name Primary?   Malignant neoplasm of lower-inner quadrant of left breast in female, estrogen receptor positive (HCC) Yes    SUMMARY OF ONCOLOGIC HISTORY: Oncology History  Malignant neoplasm of lower-inner quadrant of left breast in female, estrogen receptor positive (HCC)  08/20/2023 Initial Diagnosis   Screening mammogram detected left breast distortion at 7 o'clock position measuring 1.2 cm.  Extra lymph node biopsy benign, biopsy of the breast: Grade 2 IDC ER 90%, PR 90%, HER2 negative by FISH, Ki-67 5%   08/26/2023 Cancer Staging   Staging form: Breast, AJCC 8th Edition - Clinical stage from 08/26/2023: Stage IA (cT1c, cN0(f), cM0, G2, ER+, PR+, HER2-) - Signed by Serena Croissant, MD on 08/28/2023 Stage prefix: Initial diagnosis Method of lymph node assessment: Core biopsy Histologic grading system: 3 grade system   09/05/2023 Genetic Testing   Negative [facility] CancerNext-Expanded +RNAinsight Panel.  Report date is 09/05/2023.   The CancerNext-Expanded gene panel offered by [facility] and includes sequencing, rearrangement, and RNA analysis for the following 76 genes: AIP, ALK, APC, ATM, AXIN2, BAP1, BARD1, BMPR1A, BRCA1, BRCA2, BRIP1, CDC73, CDH1, CDK4, CDKN1B, CDKN2A, CEBPA, CHEK2, CTNNA1, DDX41, DICER1, ETV6, FH, FLCN, GATA2, LZTR1, MAX, MBD4, MEN1, MET, MLH1, MSH2, MSH3, MSH6, MUTYH, NF1, NF2, NTHL1, PALB2, PHOX2B, PMS2, POT1, PRKAR1A, PTCH1, PTEN, RAD51C, RAD51D, RB1, RET, RUNX1, SDHA, SDHAF2, SDHB, SDHC, SDHD, SMAD4, SMARCA4, SMARCB1, SMARCE1, STK11, SUFU, TMEM127, TP53, TSC1,  TSC2, VHL, and WT1 (sequencing and deletion/duplication); EGFR, HOXB13, KIT, MITF, PDGFRA, POLD1, and POLE (sequencing only); EPCAM and GREM1 (deletion/duplication only).    10/15/2023 -  Chemotherapy   Patient is on Treatment Plan : BREAST DOSE DENSE AC q14d / PACLitaxel q7d       CHIEF COMPLIANT: Cycle 1 dose dense Adriamycin and Cytoxan  HISTORY OF PRESENT ILLNESS:   History of Present Illness The patient, with a history of cancer, presents with anxiety related to her upcoming chemotherapy treatment. She expresses concerns about her medication regimen and the use of supplements during chemotherapy. She specifically inquires about the use of MCT oil and collagen, which she takes as part of her morning routine. The patient also mentions the use of magnesium citrate for constipation. She is scheduled to receive an injection as part of her treatment and has been advised to hydrate and maintain a diet rich in protein and vegetables.     ALLERGIES:  is allergic to methimazole.  MEDICATIONS:  Current Outpatient Medications  Medication Sig Dispense Refill   venlafaxine XR (EFFEXOR-XR) 150 MG 24 hr capsule Take 150 mg by mouth daily.     venlafaxine XR (EFFEXOR-XR) 75 MG 24 hr capsule Take 75 mg by mouth daily.     VRAYLAR 1.5 MG capsule Take 1.5 mg by mouth every other day.     dexamethasone (DECADRON) 4 MG tablet Take 1 tablet by mouth daily for 2 days. Start the day after doxorubicin/cyclophosphamide chemotherapy. Take with food. (Patient not taking: Reported on 10/15/2023) 8 tablet 0   levothyroxine (SYNTHROID) 125 MCG tablet Take 125 mcg by mouth daily before breakfast. (Patient not taking: Reported on 10/15/2023)     lidocaine-prilocaine (EMLA) cream Apply to affected area  once (Patient not taking: Reported on 10/09/2023) 30 g 3   ondansetron (ZOFRAN) 8 MG tablet Take 1 tab (8 mg) by mouth every 8 hrs as needed for nausea/vomiting. Start third day after doxorubicin/cyclophosphamide  chemotherapy. (Patient not taking: Reported on 10/15/2023) 30 tablet 1   prochlorperazine (COMPAZINE) 10 MG tablet Take 1 tablet (10 mg total) by mouth every 6 (six) hours as needed for nausea or vomiting. (Patient not taking: Reported on 10/15/2023) 30 tablet 1   No current facility-administered medications for this visit.   Facility-Administered Medications Ordered in Other Visits  Medication Dose Route Frequency Provider Last Rate Last Admin   0.9 %  sodium chloride infusion   Intravenous Continuous Serena Croissant, MD 10 mL/hr at 10/15/23 1128 New Bag at 10/15/23 1128   cyclophosphamide (CYTOXAN) 1,000 mg in sodium chloride 0.9 % 250 mL chemo infusion  600 mg/m2 (Treatment Plan Recorded) Intravenous Once Serena Croissant, MD       DOXOrubicin (ADRIAMYCIN) chemo injection 108 mg  60 mg/m2 (Treatment Plan Recorded) Intravenous Once Serena Croissant, MD       heparin lock flush 100 unit/mL  500 Units Intracatheter Once PRN Serena Croissant, MD       sodium chloride flush (NS) 0.9 % injection 10 mL  10 mL Intracatheter PRN Serena Croissant, MD        PHYSICAL EXAMINATION: ECOG PERFORMANCE STATUS: 1 - Symptomatic but completely ambulatory  Vitals:   10/15/23 1047  BP: 127/77  Pulse: 80  Resp: 17  Temp: 98.6 F (37 C)  SpO2: 98%   Filed Weights   10/15/23 1047  Weight: 158 lb 6.4 oz (71.8 kg)       LABORATORY DATA:  I have reviewed the data as listed    Latest Ref Rng & Units 10/15/2023   10:11 AM 08/28/2023   12:02 PM 10/18/2009   11:36 PM  CMP  Glucose 70 - 99 mg/dL 87  75  161   BUN 6 - 20 mg/dL 14  22  15    Creatinine 0.44 - 1.00 mg/dL 0.96  0.45  4.09   Sodium 135 - 145 mmol/L 139  141  140   Potassium 3.5 - 5.1 mmol/L 4.4  4.2  4.2   Chloride 98 - 111 mmol/L 106  106  106   CO2 22 - 32 mmol/L 27  27  27    Calcium 8.9 - 10.3 mg/dL 9.8  9.6  9.3   Total Protein 6.5 - 8.1 g/dL 7.2  7.4  7.2   Total Bilirubin 0.0 - 1.2 mg/dL 0.3  0.3  0.3   Alkaline Phos 38 - 126 U/L 78  63  48   AST 15  - 41 U/L 26  14  18    ALT 0 - 44 U/L 32  7  13     Lab Results  Component Value Date   WBC 7.5 10/15/2023   HGB 14.1 10/15/2023   HCT 42.1 10/15/2023   MCV 91.1 10/15/2023   PLT 252 10/15/2023   NEUTROABS 4.0 10/15/2023    ASSESSMENT & PLAN:  Malignant neoplasm of lower-inner quadrant of left breast in female, estrogen receptor positive (HCC) 08/20/2023:Screening mammogram detected left breast distortion at 7 o'clock position measuring 1.2 cm.  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 Adjuvant radiation therapy Adjuvant antiestrogen therapy with ovarian function suppression and anastrozole, along with Verzenio --------------------------------------------------------------------------------------------------------------------- Current treatment: Cycle 1 day 1 dose dense Adriamycin and Cytoxan Labs reviewed, chemo education completed, chemo consent obtained  Follow-up in 1 week for toxicity check     No orders of the defined types were placed in this encounter.  The patient has a good understanding of the overall plan. she agrees with it. she will call with any problems that may develop before the next visit here. Total time spent: 30 mins including face to face time and time spent for planning, charting and co-ordination of care   Tamsen Meek, MD 10/15/23

## 2023-10-15 NOTE — Assessment & Plan Note (Signed)
 08/20/2023:Screening mammogram detected left breast distortion at 7 o'clock position measuring 1.2 cm.  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 Adjuvant radiation therapy Adjuvant antiestrogen therapy with ovarian function suppression and anastrozole, along with Verzenio --------------------------------------------------------------------------------------------------------------------- Current treatment: Cycle 1 day 1 dose dense Adriamycin and Cytoxan Labs reviewed, chemo education completed, chemo consent obtained  Follow-up in 1 week for toxicity check

## 2023-10-16 NOTE — Therapy (Unsigned)
 OUTPATIENT PHYSICAL THERAPY BREAST CANCER POST OP FOLLOW UP   Patient Name: Alicia Roach MRN: [id number] DOB:Mar 26, 1976, 48 y.o., female Today's Date: 10/17/2023  END OF SESSION:  PT End of Session - 10/17/23 1003     Visit Number 2    Number of Visits 2    PT Start Time 1001    PT Stop Time 1039    PT Time Calculation (min) 38 min    Activity Tolerance Patient tolerated treatment well    Behavior During Therapy WFL for tasks assessed/performed             Past Medical History:  Diagnosis Date   Cancer (HCC)    Depression    GAD (generalized anxiety disorder)    Dr. Ann Maki [place]--sees her regularly   Hypothyroidism    s/p radioactive iodine ablation 2010    PONV (postoperative nausea and vomiting)    PONV during c section   Retropharyngeal abscess 08/21/1995   Past Surgical History:  Procedure Laterality Date   BREAST BIOPSY Left 08/20/2023   [place] LT BREAST BX W LOC DEV 1ST LESION IMG BX SPEC US GUIDE 08/20/2023 [facility]   BREAST BIOPSY Left 09/16/2023   [place] LT RADIOACTIVE SEED LOC 09/16/2023 [facility]   BREAST BIOPSY Left 09/16/2023   [place] LT RADIOACTIVE SEED EA ADD LESION 09/16/2023 [facility]   BREAST LUMPECTOMY WITH RADIOACTIVE SEED AND SENTINEL LYMPH NODE BIOPSY Left 09/17/2023   Procedure: LEFT BREAST SEED GUIDED LUMPECTOMY, LEFT AXILLARY SENTINEL NODE BIOPSY;  Surgeon: Emelia Loron, MD;  Location: [facility];  Service: General;  Laterality: Left;  GEN PEC BLOCK   CESAREAN SECTION  10/11/2011   Procedure: CESAREAN SECTION;  Surgeon: Meriel Pica, MD;  Location: [facility];  Service: Gynecology;  Laterality: N/A;   digital reconstructive surgery (middle finger of L hand)     PORTACATH PLACEMENT Right 10/07/2023   Procedure: INSERTION PORT-A-CATH WITH ULTRASOUND GUIDANCE;  Surgeon: Emelia Loron, MD;  Location: [facility];  Service: General;  Laterality: Right;   RADIOACTIVE SEED GUIDED AXILLARY SENTINEL LYMPH NODE  Left 09/17/2023   Procedure: LEFT AXILLARY NODE SEED GUIDED EXCISION;  Surgeon: Emelia Loron, MD;  Location: [facility];  Service: General;  Laterality: Left;   TONSILLECTOMY  1999   WISDOM TOOTH EXTRACTION     Patient Active Problem List   Diagnosis Date Noted   Port-A-Cath in place 10/15/2023   Genetic testing 09/06/2023   Malignant neoplasm of lower-inner quadrant of left breast in female, estrogen receptor positive (HCC) 08/26/2023   Acute low back pain 02/28/2014   Pharyngitis, acute 07/02/2012   Viral URI 04/23/2012   SINUSITIS - ACUTE-NOS 06/16/2010   ADVERSE DRUG REACTION 10/18/2009   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 Emelia Loron  REFERRING DIAG: Left breast cancer  THERAPY DIAG:  Malignant neoplasm of upper-inner quadrant of left breast in female, estrogen receptor positive [facility])  Aftercare following surgery for neoplasm  Abnormal posture  Rationale for Evaluation and Treatment: Rehabilitation  ONSET DATE: 09/17/2023  SUBJECTIVE:  SUBJECTIVE STATEMENT: Patient reports she underwent a left lumpectomy and sentinel node biopsy on 09/17/2023 (1 of 5 nodes positive for carcinoma). She began chemotherapy on 10/15/2023 and will be followed by radiation and anti-estrogen therapy.  PERTINENT HISTORY:  Patient was diagnosed on 07/23/2023 with left grade 2 invasive ductal carcinoma breast cancer. She underwent a left lumpectomy and sentinel node biopsy on 09/17/2023 (1 of 5 nodes positive for carcinoma). It is ER/PR positive and HER2 negative with a Ki67 of 5%.   PATIENT GOALS:  Reassess how my recovery is going related to arm function, pain, and swelling.  PAIN:  Are you having pain? No  PRECAUTIONS: Recent Surgery, left UE Lymphedema  risk  RED FLAGS: None   ACTIVITY LEVEL / LEISURE: Not exercising   OBJECTIVE:   PATIENT SURVEYS:  QUICK DASH:  Quick Dash - 10/17/23 0001     Open a tight or new jar Mild difficulty    Do heavy household chores (wash walls, wash floors) No difficulty    Carry a shopping bag or briefcase No difficulty    Wash your back Mild difficulty    Use a knife to cut food No difficulty    Recreational activities in which you take some force or impact through your arm, shoulder, or hand (golf, hammering, tennis) Mild difficulty    During the past week, to what extent has your arm, shoulder or hand problem interfered with your normal social activities with family, friends, neighbors, or groups? Not at all    During the past week, to what extent has your arm, shoulder or hand problem limited your work or other regular daily activities Not at all    Arm, shoulder, or hand pain. None    Tingling (pins and needles) in your arm, shoulder, or hand Mild    Difficulty Sleeping No difficulty    DASH Score 9.09 %              OBSERVATIONS: Left breast and axillary incisions are both healing well with no signs of edema or infection. No axillary cording or neural tension present. Moderate scar tissue present but scars are mobile.. There appears to be a stitch poking through at her axillary incision but she reports it is not bothering her so I encouraged her to leave it alone.  POSTURE:  Rounded shoulders  LYMPHEDEMA ASSESSMENT:   UPPER EXTREMITY AROM/PROM:   A/PROM RIGHT   eval    Shoulder extension 42  Shoulder flexion 160  Shoulder abduction 173  Shoulder internal rotation 68  Shoulder external rotation 90                          (Blank rows = not tested)   A/PROM LEFT   eval Left 10/17/2023  Shoulder extension 47 45  Shoulder flexion 153 161  Shoulder abduction 168 172  Shoulder internal rotation 67 72  Shoulder external rotation 85 77                          (Blank rows = not  tested)   CERVICAL AROM: All within normal limits     UPPER EXTREMITY STRENGTH: WNL   LYMPHEDEMA ASSESSMENTS (in cm):    LANDMARK RIGHT   eval RIGHT 10/17/2023  10 cm proximal to olecranon process 27 27.3  Olecranon process 23.7 24.2  10 cm proximal to ulnar styloid process 22.4 22.4  Just proximal to ulnar styloid process  16 16.1  Across hand at thumb web space 19.4 19.8  At base of 2nd digit 6 6  (Blank rows = not tested)   LANDMARK LEFT   eval LEFT 10/17/2023  10 cm proximal to olecranon process 26.5 26.8  Olecranon process 23.7 23.9  10 cm proximal to ulnar styloid process 21 21  Just proximal to ulnar styloid process 15.6 15.5  Across hand at thumb web space 18.8 18.6  At base of 2nd digit 5.8 5.8  (Blank rows = not tested)    Surgery type/Date: Left lumpectomy and sentinel node biopsy 09/17/2023 Number of lymph nodes removed: 5 Current/past treatment (chemo, radiation, hormone therapy): Chemotherapy Other symptoms:  Heaviness/tightness No Pain No Pitting edema No Infections No Decreased scar mobility Yes Stemmer sign No  PATIENT EDUCATION:  Education details: Scar massage; lymphedema risk and risk reduction; reviewed importance of a walking program and continuing HEP through radiation. Educated pt and her husband on possibility of axillary cording. Person educated: Patient and Spouse Education method: Explanation, Demonstration, and Handouts Education comprehension: verbalized understanding and returned demonstration  HOME EXERCISE PROGRAM: Reviewed previously given post op HEP.   ASSESSMENT:  CLINICAL IMPRESSION: Patient is doing great s/p left lumpectomy and sentinel node biopsy 09/17/2023. She had 1 of 5 nodes positive so will likely increase the axillary region with radiation after completion of chemotherapy. She has regained full shoulder ROM, shows no signs of lymphedema, and incisions have healed well. There is no need for further PT at this time but  she will return for SOZO screens.  Pt will benefit from skilled therapeutic intervention to improve on the following deficits: Decreased knowledge of precautions, impaired UE functional use, pain, decreased ROM, postural dysfunction.   PT treatment/interventions: ADL/Self care home management, [phone]- PT Re-evaluation, 97110-Therapeutic exercises, 97535- Self Care, and Scar mobilization   GOALS: Goals reviewed with patient? Yes  LONG TERM GOALS:  (STG=LTG)  GOALS Name Target Date  Goal status  1 Pt will demonstrate she has regained full shoulder ROM and function post operatively compared to baselines.  Baseline: 10/23/2023 MET     PLAN:  PT FREQUENCY/DURATION: N/A  PLAN FOR NEXT SESSION: D/C - goals met   [facility]  [address], [address]  [place] [address]  [phone]  After Breast Cancer Class Video It is recommended you view the ABC class video to be educated on lymphedema risk reduction. This video lasts for about 30 minutes. It can be viewed on our website here: [email or web address]  Scar massage You can begin gentle scar massage to you incision sites. Gently place one hand on the incision and move the skin (without sliding on the skin) in various directions. Do this for a few minutes and then you can gently massage either coconut oil or vitamin E cream into the scars.  Compression garment You should continue wearing your compression bra until you feel like you no longer have swelling.  Home exercise Program Continue doing the exercises you were given until you feel like you can do them without feeling any tightness at the end. Get back to doing these some during radiation.  Walking Program Studies show that 30 minutes of walking per day (fast enough to elevate your heart rate) can significantly reduce the risk of a cancer recurrence. If you can't walk due to other medical  reasons, we encourage you to find another activity you could do (like a stationary bike or water exercise).  Posture After breast cancer surgery,  people frequently sit with rounded shoulders posture because it puts their incisions on slack and feels better. If you sit like this and scar tissue forms in that position, you can become very tight and have pain sitting or standing with good posture. Try to be aware of your posture and sit and stand up tall to heal properly.  Follow up PT: It is recommended you return every 3 months for the first 3 years following surgery to be assessed on the SOZO machine for an L-Dex score. This helps prevent clinically significant lymphedema in 95% of patients. These follow up screens are 10 minute appointments that you are not billed for.  PHYSICAL THERAPY DISCHARGE SUMMARY  Visits from Start of Care: 2  Current functional level related to goals / functional outcomes: Goals met; see above for objective findings.   Remaining deficits: None - mild scar tissue   Education / Equipment: HEP and lymphedema risk reduction   Patient agrees to discharge. Patient goals were met. Patient is being discharged due to meeting the stated rehab goals.   Bethann Punches, [place] 10/17/23 10:50 AM

## 2023-10-17 ENCOUNTER — Encounter: Payer: Self-pay | Admitting: *Deleted

## 2023-10-17 ENCOUNTER — Inpatient Hospital Stay: Payer: No Typology Code available for payment source

## 2023-10-17 ENCOUNTER — Encounter: Payer: Self-pay | Admitting: Physical Therapy

## 2023-10-17 ENCOUNTER — Ambulatory Visit: Payer: No Typology Code available for payment source | Attending: General Surgery | Admitting: Physical Therapy

## 2023-10-17 VITALS — BP 120/78 | HR 98 | Temp 98.5°F | Resp 16

## 2023-10-17 DIAGNOSIS — Z483 Aftercare following surgery for neoplasm: Secondary | ICD-10-CM | POA: Diagnosis present

## 2023-10-17 DIAGNOSIS — C50312 Malignant neoplasm of lower-inner quadrant of left female breast: Secondary | ICD-10-CM

## 2023-10-17 DIAGNOSIS — Z170 Estrogen receptor positive status [ER+]: Secondary | ICD-10-CM | POA: Insufficient documentation

## 2023-10-17 DIAGNOSIS — R293 Abnormal posture: Secondary | ICD-10-CM | POA: Insufficient documentation

## 2023-10-17 DIAGNOSIS — Z5111 Encounter for antineoplastic chemotherapy: Secondary | ICD-10-CM | POA: Diagnosis not present

## 2023-10-17 DIAGNOSIS — C50212 Malignant neoplasm of upper-inner quadrant of left female breast: Secondary | ICD-10-CM | POA: Diagnosis present

## 2023-10-17 MED ORDER — PEGFILGRASTIM-JMDB 6 MG/0.6ML ~~LOC~~ SOSY
6.0000 mg | PREFILLED_SYRINGE | Freq: Once | SUBCUTANEOUS | Status: AC
  Administered 2023-10-17: 6 mg via SUBCUTANEOUS
  Filled 2023-10-17: qty 0.6

## 2023-10-17 NOTE — Patient Instructions (Signed)

## 2023-10-17 NOTE — Patient Instructions (Signed)
 [facility]  [address]  [place] [address]  [phone]  After Breast Cancer Class Video It is recommended you view the ABC class video to be educated on lymphedema risk reduction. This video lasts for about 30 minutes. It can be viewed on our website here: [email or web address]  Scar massage You can begin gentle scar massage to you incision sites. Gently place one hand on the incision and move the skin (without sliding on the skin) in various directions. Do this for a few minutes and then you can gently massage either coconut oil or vitamin E cream into the scars.  Compression garment You should continue wearing your compression bra until you feel like you no longer have swelling.  Home exercise Program Continue doing the exercises you were given until you feel like you can do them without feeling any tightness at the end. Get back to doing these some during radiation.  Walking Program Studies show that 30 minutes of walking per day (fast enough to elevate your heart rate) can significantly reduce the risk of a cancer recurrence. If you can't walk due to other medical reasons, we encourage you to find another activity you could do (like a stationary bike or water exercise).  Posture After breast cancer surgery, people frequently sit with rounded shoulders posture because it puts their incisions on slack and feels better. If you sit like this and scar tissue forms in that position, you can become very tight and have pain sitting or standing with good posture. Try to be aware of your posture and sit and stand up tall to heal properly.  Follow up PT: It is recommended you return every 3 months for the first 3 years following surgery to be assessed on the SOZO machine for an L-Dex score. This helps prevent clinically significant lymphedema in 95% of patients. These follow up screens are 10  minute appointments that you are not billed for.

## 2023-10-17 NOTE — Research (Signed)
 DCP-001: Use of a Clinical Trial Screening Tool to Address Cancer Health Disparities in the [facility] Oncology Research Program [facility])    Patient Alicia Roach was identified by Dr. Pamelia Hoit as a potential candidate for the above listed study.  This Clinical Research Coordinator met with JOICE NAZARIO, [id number] on 10/17/23 in a manner and location that ensures patient privacy to discuss participation in the above listed research study.  Patient is Accompanied by boyfriend .  Patient was previously provided with informed consent documents.  Patient confirmed they have read the informed consent documents.  As outlined in the informed consent form, this Coordinator and ALYXANDRA TENBRINK discussed the purpose of the research study, the investigational nature of the study, study procedures and requirements for study participation, potential risks and benefits of study participation, as well as alternatives to participation.  This study is not blinded or double-blinded. The patient understands participation is voluntary and they may withdraw from study participation at any time.  This study does not involve randomization.  This study does not involve an investigational drug or device. This study does not involve a placebo. Patient understands enrollment is pending full eligibility review.   Confidentiality and how the patient's information will be used as part of study participation were discussed.  Patient was informed there is not reimbursement provided for their time and effort spent on trial participation.  The patient is encouraged to discuss research study participation with their insurance provider to determine what costs they may incur as part of study participation, including research related injury.    All questions were answered to patient's satisfaction.  The informed consent and separate HIPAA Authorization was reviewed. The patient's mental and emotional status is appropriate to provide  informed consent, and the patient verbalizes an understanding of study participation.  Patient has agreed to participate in the above listed research study and has voluntarily signed the informed consent version 09/18/21 and separate HIPAA Authorization, version 01/28/23-01/28/24  on 10/17/23 at 1:30PM.  The patient was provided with a copy of the signed informed consent form and separate HIPAA Authorization for their reference.  No study specific procedures were obtained prior to the signing of the informed consent document.  Approximately 15 minutes were spent with the patient reviewing the informed consent documents.  Patient was not requested to complete a Release of Information form. After the patient signed consent the coordinator asked the patient the DCP demographic questions. The patient was thanked for her time and support of this study.  Cooper Render, MPH  Clinical Research Coordinator

## 2023-10-18 ENCOUNTER — Other Ambulatory Visit: Payer: Self-pay

## 2023-10-21 ENCOUNTER — Other Ambulatory Visit: Payer: No Typology Code available for payment source

## 2023-10-21 ENCOUNTER — Other Ambulatory Visit: Payer: Self-pay

## 2023-10-21 ENCOUNTER — Ambulatory Visit: Payer: No Typology Code available for payment source | Admitting: Adult Health

## 2023-10-21 ENCOUNTER — Ambulatory Visit: Payer: No Typology Code available for payment source

## 2023-10-21 DIAGNOSIS — Z170 Estrogen receptor positive status [ER+]: Secondary | ICD-10-CM

## 2023-10-22 ENCOUNTER — Inpatient Hospital Stay: Payer: No Typology Code available for payment source | Attending: Hematology and Oncology

## 2023-10-22 ENCOUNTER — Inpatient Hospital Stay (HOSPITAL_BASED_OUTPATIENT_CLINIC_OR_DEPARTMENT_OTHER): Payer: No Typology Code available for payment source | Admitting: Hematology and Oncology

## 2023-10-22 VITALS — BP 116/66 | HR 114 | Temp 98.3°F | Resp 16 | Wt 161.8 lb

## 2023-10-22 DIAGNOSIS — Z5111 Encounter for antineoplastic chemotherapy: Secondary | ICD-10-CM | POA: Diagnosis present

## 2023-10-22 DIAGNOSIS — C50312 Malignant neoplasm of lower-inner quadrant of left female breast: Secondary | ICD-10-CM | POA: Diagnosis present

## 2023-10-22 DIAGNOSIS — Z170 Estrogen receptor positive status [ER+]: Secondary | ICD-10-CM | POA: Insufficient documentation

## 2023-10-22 DIAGNOSIS — Z1732 Human epidermal growth factor receptor 2 negative status: Secondary | ICD-10-CM | POA: Diagnosis not present

## 2023-10-22 DIAGNOSIS — Z1721 Progesterone receptor positive status: Secondary | ICD-10-CM | POA: Diagnosis not present

## 2023-10-22 DIAGNOSIS — Z5189 Encounter for other specified aftercare: Secondary | ICD-10-CM | POA: Diagnosis not present

## 2023-10-22 DIAGNOSIS — Z95828 Presence of other vascular implants and grafts: Secondary | ICD-10-CM

## 2023-10-22 LAB — CBC WITH DIFFERENTIAL (CANCER CENTER ONLY)
Abs Immature Granulocytes: 0.05 10*3/uL (ref 0.00–0.07)
Basophils Absolute: 0.1 10*3/uL (ref 0.0–0.1)
Basophils Relative: 2 %
Eosinophils Absolute: 0.3 10*3/uL (ref 0.0–0.5)
Eosinophils Relative: 9 %
HCT: 39.4 % (ref 36.0–46.0)
Hemoglobin: 13.3 g/dL (ref 12.0–15.0)
Immature Granulocytes: 1 %
Lymphocytes Relative: 39 %
Lymphs Abs: 1.4 10*3/uL (ref 0.7–4.0)
MCH: 30.6 pg (ref 26.0–34.0)
MCHC: 33.8 g/dL (ref 30.0–36.0)
MCV: 90.8 fL (ref 80.0–100.0)
Monocytes Absolute: 0.3 10*3/uL (ref 0.1–1.0)
Monocytes Relative: 7 %
Neutro Abs: 1.5 10*3/uL — ABNORMAL LOW (ref 1.7–7.7)
Neutrophils Relative %: 42 %
Platelet Count: 134 10*3/uL — ABNORMAL LOW (ref 150–400)
RBC: 4.34 MIL/uL (ref 3.87–5.11)
RDW: 11.9 % (ref 11.5–15.5)
Smear Review: NORMAL
WBC Count: 3.6 10*3/uL — ABNORMAL LOW (ref 4.0–10.5)
nRBC: 0 % (ref 0.0–0.2)

## 2023-10-22 LAB — CMP (CANCER CENTER ONLY)
ALT: 22 U/L (ref 0–44)
AST: 14 U/L — ABNORMAL LOW (ref 15–41)
Albumin: 4.2 g/dL (ref 3.5–5.0)
Alkaline Phosphatase: 103 U/L (ref 38–126)
Anion gap: 7 (ref 5–15)
BUN: 13 mg/dL (ref 6–20)
CO2: 25 mmol/L (ref 22–32)
Calcium: 9.3 mg/dL (ref 8.9–10.3)
Chloride: 105 mmol/L (ref 98–111)
Creatinine: 0.66 mg/dL (ref 0.44–1.00)
GFR, Estimated: >60 mL/min (ref >60)
Glucose, Bld: 161 mg/dL — ABNORMAL HIGH (ref 70–99)
Potassium: 4.1 mmol/L (ref 3.5–5.1)
Sodium: 137 mmol/L (ref 135–145)
Total Bilirubin: 0.4 mg/dL (ref 0.0–1.2)
Total Protein: 6.9 g/dL (ref 6.5–8.1)

## 2023-10-22 MED ORDER — HEPARIN SOD (PORK) LOCK FLUSH 100 UNIT/ML IV SOLN
500.0000 [IU] | Freq: Once | INTRAVENOUS | Status: AC
  Administered 2023-10-22: 500 [IU]

## 2023-10-22 MED ORDER — SODIUM CHLORIDE 0.9% FLUSH
10.0000 mL | Freq: Once | INTRAVENOUS | Status: AC
  Administered 2023-10-22: 10 mL

## 2023-10-22 NOTE — Assessment & Plan Note (Signed)
 08/20/2023:Screening mammogram detected left breast distortion at 7 o'clock position measuring 1.2 cm.  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 Adjuvant radiation therapy Adjuvant antiestrogen therapy with ovarian function suppression and anastrozole, along with Verzenio --------------------------------------------------------------------------------------------------------------------- Current treatment: Cycle 1 day 8 dose dense Adriamycin and Cytoxan Chemo toxicities:  Return to clinic in 1 week for cycle 2

## 2023-10-22 NOTE — Progress Notes (Signed)
 Patient Care Team: Letta Kocher, PA-C as PCP - General (Family Medicine) Donnelly Angelica, RN as Oncology Nurse Navigator Pershing Proud, RN as Oncology Nurse Navigator Emelia Loron, MD as Consulting Physician (General Surgery) Serena Croissant, MD as Consulting Physician (Hematology and Oncology) Dorothy Puffer, MD as Consulting Physician (Radiation Oncology)  DIAGNOSIS:  Encounter Diagnosis  Name Primary?   Malignant neoplasm of lower-inner quadrant of left breast in female, estrogen receptor positive (HCC) Yes    SUMMARY OF ONCOLOGIC HISTORY: Oncology History  Malignant neoplasm of lower-inner quadrant of left breast in female, estrogen receptor positive (HCC)  08/20/2023 Initial Diagnosis   Screening mammogram detected left breast distortion at 7 o'clock position measuring 1.2 cm.  Extra lymph node biopsy benign, biopsy of the breast: Grade 2 IDC ER 90%, PR 90%, HER2 negative by FISH, Ki-67 5%   08/26/2023 Cancer Staging   Staging form: Breast, AJCC 8th Edition - Clinical stage from 08/26/2023: Stage IA (cT1c, cN0(f), cM0, G2, ER+, PR+, HER2-) - Signed by Serena Croissant, MD on 08/28/2023 Stage prefix: Initial diagnosis Method of lymph node assessment: Core biopsy Histologic grading system: 3 grade system   09/05/2023 Genetic Testing   Negative [facility] CancerNext-Expanded +RNAinsight Panel.  Report date is 09/05/2023.   The CancerNext-Expanded gene panel offered by [facility] and includes sequencing, rearrangement, and RNA analysis for the following 76 genes: AIP, ALK, APC, ATM, AXIN2, BAP1, BARD1, BMPR1A, BRCA1, BRCA2, BRIP1, CDC73, CDH1, CDK4, CDKN1B, CDKN2A, CEBPA, CHEK2, CTNNA1, DDX41, DICER1, ETV6, FH, FLCN, GATA2, LZTR1, MAX, MBD4, MEN1, MET, MLH1, MSH2, MSH3, MSH6, MUTYH, NF1, NF2, NTHL1, PALB2, PHOX2B, PMS2, POT1, PRKAR1A, PTCH1, PTEN, RAD51C, RAD51D, RB1, RET, RUNX1, SDHA, SDHAF2, SDHB, SDHC, SDHD, SMAD4, SMARCA4, SMARCB1, SMARCE1, STK11, SUFU, TMEM127, TP53, TSC1,  TSC2, VHL, and WT1 (sequencing and deletion/duplication); EGFR, HOXB13, KIT, MITF, PDGFRA, POLD1, and POLE (sequencing only); EPCAM and GREM1 (deletion/duplication only).    10/15/2023 -  Chemotherapy   Patient is on Treatment Plan : BREAST DOSE DENSE AC q14d / PACLitaxel q7d       CHIEF COMPLIANT: Cycle 1 day 8 dose dense Adriamycin and Cytoxan  HISTORY OF PRESENT ILLNESS:   History of Present Illness The patient, with a history of cancer, presents for a follow-up after her first round of chemotherapy. She reports feeling 'tired and weak' for two days following the injection, but has since returned to 'normal.' She denies any nausea, but initially experienced some constipation which has since resolved. She is now having daily bowel movements.  The patient also mentions a concern with her port, as the nurse was unable to draw blood from it during this visit. She hopes it will function properly for her next round of chemotherapy.  In addition, the patient inquires about her Oncotype results, which were cancelled due to the positive lymph nodes making the test irrelevant. She also asks about her risk for recurrence, which is estimated to be less than 10% with chemotherapy.  Lastly, the patient asks about taking Juice Plus, a supplement, and protein powder.     ALLERGIES:  is allergic to methimazole.  MEDICATIONS:  Current Outpatient Medications  Medication Sig Dispense Refill   Collagen Hydrolysate POWD 2 Scoops by Does not apply route.     levothyroxine (SYNTHROID) 125 MCG tablet Take 125 mcg by mouth daily before breakfast.     medium chain triglycerides (MCT OIL) oil Take by mouth 3 (three) times daily.     venlafaxine XR (EFFEXOR-XR) 75 MG 24 hr capsule Take  75 mg by mouth daily.     VRAYLAR 1.5 MG capsule Take 1.5 mg by mouth every other day.     dexamethasone (DECADRON) 4 MG tablet Take 1 tablet by mouth daily for 2 days. Start the day after doxorubicin/cyclophosphamide  chemotherapy. Take with food. (Patient not taking: Reported on 10/09/2023) 8 tablet 0   lidocaine-prilocaine (EMLA) cream Apply to affected area once (Patient not taking: Reported on 10/22/2023) 30 g 3   ondansetron (ZOFRAN) 8 MG tablet Take 1 tab (8 mg) by mouth every 8 hrs as needed for nausea/vomiting. Start third day after doxorubicin/cyclophosphamide chemotherapy. (Patient not taking: Reported on 10/09/2023) 30 tablet 1   prochlorperazine (COMPAZINE) 10 MG tablet Take 1 tablet (10 mg total) by mouth every 6 (six) hours as needed for nausea or vomiting. (Patient not taking: Reported on 10/09/2023) 30 tablet 1   venlafaxine XR (EFFEXOR-XR) 150 MG 24 hr capsule Take 150 mg by mouth daily. (Patient not taking: Reported on 10/22/2023)     No current facility-administered medications for this visit.    PHYSICAL EXAMINATION: ECOG PERFORMANCE STATUS: 1 - Symptomatic but completely ambulatory  Vitals:   10/22/23 1429  BP: 116/66  Pulse: (!) 114  Resp: 16  Temp: 98.3 F (36.8 C)  SpO2: 98%   Filed Weights   10/22/23 1429  Weight: 161 lb 12.8 oz (73.4 kg)      LABORATORY DATA:  I have reviewed the data as listed    Latest Ref Rng & Units 10/22/2023    1:58 PM 10/15/2023   10:11 AM 08/28/2023   12:02 PM  CMP  Glucose 70 - 99 mg/dL 161  87  75   BUN 6 - 20 mg/dL 13  14  22    Creatinine 0.44 - 1.00 mg/dL 0.96  0.45  4.09   Sodium 135 - 145 mmol/L 137  139  141   Potassium 3.5 - 5.1 mmol/L 4.1  4.4  4.2   Chloride 98 - 111 mmol/L 105  106  106   CO2 22 - 32 mmol/L 25  27  27    Calcium 8.9 - 10.3 mg/dL 9.3  9.8  9.6   Total Protein 6.5 - 8.1 g/dL 6.9  7.2  7.4   Total Bilirubin 0.0 - 1.2 mg/dL 0.4  0.3  0.3   Alkaline Phos 38 - 126 U/L 103  78  63   AST 15 - 41 U/L 14  26  14    ALT 0 - 44 U/L 22  32  7     Lab Results  Component Value Date   WBC 3.6 (L) 10/22/2023   HGB 13.3 10/22/2023   HCT 39.4 10/22/2023   MCV 90.8 10/22/2023   PLT 134 (L) 10/22/2023   NEUTROABS 1.5 (L)  10/22/2023    ASSESSMENT & PLAN:  Malignant neoplasm of lower-inner quadrant of left breast in female, estrogen receptor positive (HCC) 08/20/2023:Screening mammogram detected left breast distortion at 7 o'clock position measuring 1.2 cm.  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 Adjuvant radiation therapy Adjuvant antiestrogen therapy with ovarian function suppression and anastrozole, along with  Verzenio --------------------------------------------------------------------------------------------------------------------- Current treatment: Cycle 1 day 8 dose dense Adriamycin and Cytoxan Chemo toxicities: Severe fatigue for 2 to 3 days Denied any nausea or vomiting.  Mild constipation but relieved easily.  Return to clinic in 1 week for cycle 2 ------------------------------------- Assessment and Plan Assessment & Plan Chemotherapy Side Effects: Fatigue and weakness for 2 days post-treatment. No nausea. Initial constipation followed by regular bowel movements. -Continue current management.  Hematologic Changes: Neutrophils decreased from 4 to 1.5, but within acceptable range (anything above 1 is normal). Hemoglobin decreased slightly from 14 to 13.3, expected to continue to decline with each treatment. Platelets at 134, within normal range. -Monitor blood counts weekly.  General Health Maintenance: Patient inquired about supplement use. -Approved use of Juice Plus and protein powder as needed, provided patient feels she is not getting enough nutrients.  Follow-up: Next round of chemotherapy scheduled for next  week.      No orders of the defined types were placed in this encounter.  The patient has a good understanding of the overall plan. she agrees with it. she will call with any problems that may develop before the next visit here. Total time spent: 30 mins including face to face time and time spent for planning, charting and co-ordination of care   Tamsen Meek, MD 10/22/23

## 2023-10-24 ENCOUNTER — Ambulatory Visit: Payer: No Typology Code available for payment source

## 2023-10-28 MED FILL — Fosaprepitant Dimeglumine For IV Infusion 150 MG (Base Eq): INTRAVENOUS | Qty: 5 | Status: AC

## 2023-10-29 ENCOUNTER — Inpatient Hospital Stay (HOSPITAL_BASED_OUTPATIENT_CLINIC_OR_DEPARTMENT_OTHER): Payer: No Typology Code available for payment source | Admitting: Hematology and Oncology

## 2023-10-29 ENCOUNTER — Inpatient Hospital Stay: Payer: No Typology Code available for payment source

## 2023-10-29 VITALS — BP 135/90 | HR 123 | Temp 97.8°F | Resp 16 | Ht 66.0 in | Wt 162.0 lb

## 2023-10-29 VITALS — HR 88 | Resp 18

## 2023-10-29 DIAGNOSIS — Z95828 Presence of other vascular implants and grafts: Secondary | ICD-10-CM

## 2023-10-29 DIAGNOSIS — Z5111 Encounter for antineoplastic chemotherapy: Secondary | ICD-10-CM | POA: Diagnosis not present

## 2023-10-29 DIAGNOSIS — C50312 Malignant neoplasm of lower-inner quadrant of left female breast: Secondary | ICD-10-CM | POA: Diagnosis not present

## 2023-10-29 DIAGNOSIS — Z170 Estrogen receptor positive status [ER+]: Secondary | ICD-10-CM

## 2023-10-29 LAB — CMP (CANCER CENTER ONLY)
ALT: 20 U/L (ref 0–44)
AST: 20 U/L (ref 15–41)
Albumin: 4.3 g/dL (ref 3.5–5.0)
Alkaline Phosphatase: 120 U/L (ref 38–126)
Anion gap: 6 (ref 5–15)
BUN: 13 mg/dL (ref 6–20)
CO2: 28 mmol/L (ref 22–32)
Calcium: 9.1 mg/dL (ref 8.9–10.3)
Chloride: 104 mmol/L (ref 98–111)
Creatinine: 0.78 mg/dL (ref 0.44–1.00)
GFR, Estimated: >60 mL/min (ref >60)
Glucose, Bld: 109 mg/dL — ABNORMAL HIGH (ref 70–99)
Potassium: 4.2 mmol/L (ref 3.5–5.1)
Sodium: 138 mmol/L (ref 135–145)
Total Bilirubin: 0.3 mg/dL (ref 0.0–1.2)
Total Protein: 7 g/dL (ref 6.5–8.1)

## 2023-10-29 LAB — CBC WITH DIFFERENTIAL (CANCER CENTER ONLY)
Abs Immature Granulocytes: 0.81 10*3/uL — ABNORMAL HIGH (ref 0.00–0.07)
Basophils Absolute: 0.3 10*3/uL — ABNORMAL HIGH (ref 0.0–0.1)
Basophils Relative: 2 %
Eosinophils Absolute: 0.3 10*3/uL (ref 0.0–0.5)
Eosinophils Relative: 2 %
HCT: 38.7 % (ref 36.0–46.0)
Hemoglobin: 13.3 g/dL (ref 12.0–15.0)
Immature Granulocytes: 7 %
Lymphocytes Relative: 12 %
Lymphs Abs: 1.4 10*3/uL (ref 0.7–4.0)
MCH: 30.6 pg (ref 26.0–34.0)
MCHC: 34.4 g/dL (ref 30.0–36.0)
MCV: 89 fL (ref 80.0–100.0)
Monocytes Absolute: 1 10*3/uL (ref 0.1–1.0)
Monocytes Relative: 9 %
Neutro Abs: 7.9 10*3/uL — ABNORMAL HIGH (ref 1.7–7.7)
Neutrophils Relative %: 68 %
Platelet Count: 177 10*3/uL (ref 150–400)
RBC: 4.35 MIL/uL (ref 3.87–5.11)
RDW: 12.3 % (ref 11.5–15.5)
WBC Count: 11.6 10*3/uL — ABNORMAL HIGH (ref 4.0–10.5)
nRBC: 0 % (ref 0.0–0.2)

## 2023-10-29 MED ORDER — SODIUM CHLORIDE 0.9 % IV SOLN
150.0000 mg | Freq: Once | INTRAVENOUS | Status: AC
  Administered 2023-10-29: 150 mg via INTRAVENOUS
  Filled 2023-10-29: qty 150

## 2023-10-29 MED ORDER — SODIUM CHLORIDE 0.9% FLUSH
10.0000 mL | Freq: Once | INTRAVENOUS | Status: AC
  Administered 2023-10-29: 10 mL

## 2023-10-29 MED ORDER — PALONOSETRON HCL INJECTION 0.25 MG/5ML
0.2500 mg | Freq: Once | INTRAVENOUS | Status: AC
  Administered 2023-10-29: 0.25 mg via INTRAVENOUS
  Filled 2023-10-29: qty 5

## 2023-10-29 MED ORDER — HEPARIN SOD (PORK) LOCK FLUSH 100 UNIT/ML IV SOLN
500.0000 [IU] | Freq: Once | INTRAVENOUS | Status: AC | PRN
  Administered 2023-10-29: 500 [IU]

## 2023-10-29 MED ORDER — DEXAMETHASONE SODIUM PHOSPHATE 10 MG/ML IJ SOLN
10.0000 mg | Freq: Once | INTRAMUSCULAR | Status: AC
  Administered 2023-10-29: 10 mg via INTRAVENOUS
  Filled 2023-10-29: qty 1

## 2023-10-29 MED ORDER — DOXORUBICIN HCL CHEMO IV INJECTION 2 MG/ML
60.0000 mg/m2 | Freq: Once | INTRAVENOUS | Status: AC
  Administered 2023-10-29: 108 mg via INTRAVENOUS
  Filled 2023-10-29: qty 54

## 2023-10-29 MED ORDER — SODIUM CHLORIDE 0.9 % IV SOLN
INTRAVENOUS | Status: DC
Start: 2023-10-29 — End: 2023-10-29

## 2023-10-29 MED ORDER — SODIUM CHLORIDE 0.9% FLUSH
10.0000 mL | INTRAVENOUS | Status: DC | PRN
Start: 2023-10-29 — End: 2023-10-29
  Administered 2023-10-29: 10 mL

## 2023-10-29 MED ORDER — AMOXICILLIN 500 MG PO CAPS: 500.0000 mg | ORAL_CAPSULE | Freq: Three times a day (TID) | ORAL | 0 refills | Status: DC

## 2023-10-29 MED ORDER — SODIUM CHLORIDE 0.9 % IV SOLN
600.0000 mg/m2 | Freq: Once | INTRAVENOUS | Status: AC
  Administered 2023-10-29: 1000 mg via INTRAVENOUS
  Filled 2023-10-29: qty 50

## 2023-10-29 NOTE — Progress Notes (Signed)
 Patient Care Team: Letta Kocher, PA-C as PCP - General (Family Medicine) Donnelly Angelica, RN as Oncology Nurse Navigator Pershing Proud, RN as Oncology Nurse Navigator Emelia Loron, MD as Consulting Physician (General Surgery) Serena Croissant, MD as Consulting Physician (Hematology and Oncology) Dorothy Puffer, MD as Consulting Physician (Radiation Oncology)  DIAGNOSIS:  Encounter Diagnosis  Name Primary?   Malignant neoplasm of lower-inner quadrant of left breast in female, estrogen receptor positive (HCC) Yes    SUMMARY OF ONCOLOGIC HISTORY: Oncology History  Malignant neoplasm of lower-inner quadrant of left breast in female, estrogen receptor positive (HCC)  08/20/2023 Initial Diagnosis   Screening mammogram detected left breast distortion at 7 o'clock position measuring 1.2 cm.  Extra lymph node biopsy benign, biopsy of the breast: Grade 2 IDC ER 90%, PR 90%, HER2 negative by FISH, Ki-67 5%   08/26/2023 Cancer Staging   Staging form: Breast, AJCC 8th Edition - Clinical stage from 08/26/2023: Stage IA (cT1c, cN0(f), cM0, G2, ER+, PR+, HER2-) - Signed by Serena Croissant, MD on 08/28/2023 Stage prefix: Initial diagnosis Method of lymph node assessment: Core biopsy Histologic grading system: 3 grade system   09/05/2023 Genetic Testing   Negative [facility] CancerNext-Expanded +RNAinsight Panel.  Report date is 09/05/2023.   The CancerNext-Expanded gene panel offered by [facility] and includes sequencing, rearrangement, and RNA analysis for the following 76 genes: AIP, ALK, APC, ATM, AXIN2, BAP1, BARD1, BMPR1A, BRCA1, BRCA2, BRIP1, CDC73, CDH1, CDK4, CDKN1B, CDKN2A, CEBPA, CHEK2, CTNNA1, DDX41, DICER1, ETV6, FH, FLCN, GATA2, LZTR1, MAX, MBD4, MEN1, MET, MLH1, MSH2, MSH3, MSH6, MUTYH, NF1, NF2, NTHL1, PALB2, PHOX2B, PMS2, POT1, PRKAR1A, PTCH1, PTEN, RAD51C, RAD51D, RB1, RET, RUNX1, SDHA, SDHAF2, SDHB, SDHC, SDHD, SMAD4, SMARCA4, SMARCB1, SMARCE1, STK11, SUFU, TMEM127, TP53, TSC1,  TSC2, VHL, and WT1 (sequencing and deletion/duplication); EGFR, HOXB13, KIT, MITF, PDGFRA, POLD1, and POLE (sequencing only); EPCAM and GREM1 (deletion/duplication only).    10/15/2023 -  Chemotherapy   Patient is on Treatment Plan : BREAST DOSE DENSE AC q14d / PACLitaxel q7d       CHIEF COMPLIANT: Cold symptoms and fatigue, cycle 2 Adriamycin and Cytoxan  HISTORY OF PRESENT ILLNESS: History of Present Illness The patient, with a history of cancer and recent chemotherapy, presents with a cold. She reports a 'bad cough,' headache, and fatigue, but denies fever. The fatigue is consistent with her post-chemotherapy course, however, the onset of cold symptoms is new. She has been sleeping a lot due to the fatigue. She is concerned about how to manage the cold symptoms and inquires about the use of cough syrup.     ALLERGIES:  is allergic to methimazole.  MEDICATIONS:  Current Outpatient Medications  Medication Sig Dispense Refill   amoxicillin (AMOXIL) 500 MG capsule Take 1 capsule (500 mg total) by mouth 3 (three) times daily. 14 capsule 0   Collagen Hydrolysate POWD 2 Scoops by Does not apply route.     dexamethasone (DECADRON) 4 MG tablet Take 1 tablet by mouth daily for 2 days. Start the day after doxorubicin/cyclophosphamide chemotherapy. Take with food. (Patient not taking: Reported on 10/09/2023) 8 tablet 0   levothyroxine (SYNTHROID) 125 MCG tablet Take 125 mcg by mouth daily before breakfast.     lidocaine-prilocaine (EMLA) cream Apply to affected area once (Patient not taking: Reported on 10/22/2023) 30 g 3   medium chain triglycerides (MCT OIL) oil Take by mouth 3 (three) times daily.     ondansetron (ZOFRAN) 8 MG tablet Take 1 tab (8 mg) by mouth  every 8 hrs as needed for nausea/vomiting. Start third day after doxorubicin/cyclophosphamide chemotherapy. (Patient not taking: Reported on 10/09/2023) 30 tablet 1   prochlorperazine (COMPAZINE) 10 MG tablet Take 1 tablet (10 mg total) by  mouth every 6 (six) hours as needed for nausea or vomiting. (Patient not taking: Reported on 10/09/2023) 30 tablet 1   venlafaxine XR (EFFEXOR-XR) 150 MG 24 hr capsule Take 150 mg by mouth daily. (Patient not taking: Reported on 10/22/2023)     venlafaxine XR (EFFEXOR-XR) 75 MG 24 hr capsule Take 75 mg by mouth daily.     VRAYLAR 1.5 MG capsule Take 1.5 mg by mouth every other day.     No current facility-administered medications for this visit.   Facility-Administered Medications Ordered in Other Visits  Medication Dose Route Frequency Provider Last Rate Last Admin   0.9 %  sodium chloride infusion   Intravenous Continuous Serena Croissant, MD 10 mL/hr at 10/29/23 1257 New Bag at 10/29/23 1257   cyclophosphamide (CYTOXAN) 1,000 mg in sodium chloride 0.9 % 250 mL chemo infusion  600 mg/m2 (Treatment Plan Recorded) Intravenous Once Serena Croissant, MD       dexamethasone (DECADRON) injection 10 mg  10 mg Intravenous Once Serena Croissant, MD       DOXOrubicin (ADRIAMYCIN) chemo injection 108 mg  60 mg/m2 (Treatment Plan Recorded) Intravenous Once Serena Croissant, MD       fosaprepitant (EMEND) 150 mg in sodium chloride 0.9 % 145 mL IVPB  150 mg Intravenous Once Serena Croissant, MD       heparin lock flush 100 unit/mL  500 Units Intracatheter Once PRN Serena Croissant, MD       palonosetron (ALOXI) injection 0.25 mg  0.25 mg Intravenous Once Serena Croissant, MD       sodium chloride flush (NS) 0.9 % injection 10 mL  10 mL Intracatheter PRN Serena Croissant, MD        PHYSICAL EXAMINATION: ECOG PERFORMANCE STATUS: 1 - Symptomatic but completely ambulatory  Vitals:   10/29/23 1156  BP: (!) 135/90  Pulse: (!) 123  Resp: 16  Temp: 97.8 F (36.6 C)  SpO2: 99%   Filed Weights   10/29/23 1156  Weight: 162 lb (73.5 kg)      LABORATORY DATA:  I have reviewed the data as listed    Latest Ref Rng & Units 10/29/2023   11:34 AM 10/22/2023    1:58 PM 10/15/2023   10:11 AM  CMP  Glucose 70 - 99 mg/dL 409  811  87    BUN 6 - 20 mg/dL 13  13  14    Creatinine 0.44 - 1.00 mg/dL 9.14  7.82  9.56   Sodium 135 - 145 mmol/L 138  137  139   Potassium 3.5 - 5.1 mmol/L 4.2  4.1  4.4   Chloride 98 - 111 mmol/L 104  105  106   CO2 22 - 32 mmol/L 28  25  27    Calcium 8.9 - 10.3 mg/dL 9.1  9.3  9.8   Total Protein 6.5 - 8.1 g/dL 7.0  6.9  7.2   Total Bilirubin 0.0 - 1.2 mg/dL 0.3  0.4  0.3   Alkaline Phos 38 - 126 U/L 120  103  78   AST 15 - 41 U/L 20  14  26    ALT 0 - 44 U/L 20  22  32     Lab Results  Component Value Date   WBC 11.6 (H) 10/29/2023   HGB  13.3 10/29/2023   HCT 38.7 10/29/2023   MCV 89.0 10/29/2023   PLT 177 10/29/2023   NEUTROABS 7.9 (H) 10/29/2023    ASSESSMENT & PLAN:  Malignant neoplasm of lower-inner quadrant of left breast in female, estrogen receptor positive (HCC) 08/20/2023:Screening mammogram detected left breast distortion at 7 o'clock position measuring 1.2 cm.  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 Adjuvant radiation therapy Adjuvant antiestrogen therapy with ovarian function suppression and anastrozole, along with Verzenio --------------------------------------------------------------------------------------------------------------------- Current treatment: Cycle 2 dose dense Adriamycin and Cytoxan Chemo toxicities: Severe fatigue for 2 to 3 days Upper respiratory infection: I sent a prescription for antibiotics with amoxicillin.  Denied any nausea or vomiting.  Mild constipation but relieved easily.   Return to clinic in 2 weeks for cycle 3 ------------------------------------- Assessment and  Plan Assessment & Plan Malignant neoplasm of lower-inner quadrant of left breast, estrogen receptor positive Lab results indicate well-managed hemoglobin, normal white blood cell count, and platelet levels, reflecting good recovery post-chemotherapy. Electrolytes, liver, and kidney functions are normal. Slightly elevated blood glucose is not concerning as it was non-fasting. - Continue current treatment regimen and monitor laboratory results.  Fatigue post-chemotherapy Experiences fatigue and increased sleep, consistent with post-chemotherapy effects. Lab work shows well-managed hemoglobin and normal white blood cell count, indicating recovery from chemotherapy. - Monitor fatigue symptoms and encourage rest as needed.  Upper respiratory tract infection Presents with cold, cough, and headache, no fever, suggesting viral infection. Due to recent chemotherapy and potential immunosuppression, antibiotics prescribed to prevent bacterial superinfection. - Prescribe amoxicillin twice daily for one week. - Advise use of cough syrup such as Robitussin for symptom relief as needed. - Instruct to start antibiotics today with food. - Advise to call if symptoms worsen or change.      No orders of the defined types were placed in this encounter.  The patient has a good understanding of the overall plan. she agrees with it. she will call with any problems that may develop before the next visit here. Total time spent: 30 mins including face to face time and time spent for planning, charting and co-ordination of care   Tamsen Meek, MD 10/29/23

## 2023-10-29 NOTE — Assessment & Plan Note (Signed)
 08/20/2023:Screening mammogram detected left breast distortion at 7 o'clock position measuring 1.2 cm.  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 Adjuvant radiation therapy Adjuvant antiestrogen therapy with ovarian function suppression and anastrozole, along with Verzenio --------------------------------------------------------------------------------------------------------------------- Current treatment: Cycle 2 dose dense Adriamycin and Cytoxan Chemo toxicities: Severe fatigue for 2 to 3 days Denied any nausea or vomiting.  Mild constipation but relieved easily.   Return to clinic in 2 weeks for cycle 3

## 2023-10-29 NOTE — Patient Instructions (Signed)
 [facility] - A DEPT OF [facility]  Discharge Instructions: Thank you for choosing [facility] to provide your oncology and hematology care.   If you have a lab appointment with the Cancer Center, please go directly to the Cancer Center and check in at the registration area.   Wear comfortable clothing and clothing appropriate for easy access to any Portacath or PICC line.   We strive to give you quality time with your provider. You may need to reschedule your appointment if you arrive late (15 or more minutes).  Arriving late affects you and other patients whose appointments are after yours.  Also, if you miss three or more appointments without notifying the office, you may be dismissed from the clinic at the provider's discretion.      For prescription refill requests, have your pharmacy contact our office and allow 72 hours for refills to be completed.    Today you received the following chemotherapy and/or immunotherapy agents: Doxorubicin, Cytoxan.       To help prevent nausea and vomiting after your treatment, we encourage you to take your nausea medication as directed.  BELOW ARE SYMPTOMS THAT SHOULD BE REPORTED IMMEDIATELY: *FEVER GREATER THAN 100.4 F (38 C) OR HIGHER *CHILLS OR SWEATING *NAUSEA AND VOMITING THAT IS NOT CONTROLLED WITH YOUR NAUSEA MEDICATION *UNUSUAL SHORTNESS OF BREATH *UNUSUAL BRUISING OR BLEEDING *URINARY PROBLEMS (pain or burning when urinating, or frequent urination) *BOWEL PROBLEMS (unusual diarrhea, constipation, pain near the anus) TENDERNESS IN MOUTH AND THROAT WITH OR WITHOUT PRESENCE OF ULCERS (sore throat, sores in mouth, or a toothache) UNUSUAL RASH, SWELLING OR PAIN  UNUSUAL VAGINAL DISCHARGE OR ITCHING   Items with * indicate a potential emergency and should be followed up as soon as possible or go to the Emergency Department if any problems should occur.  Please show the CHEMOTHERAPY ALERT CARD or  IMMUNOTHERAPY ALERT CARD at check-in to the Emergency Department and triage nurse.  Should you have questions after your visit or need to cancel or reschedule your appointment, please contact [facility] - A DE[facility]  Dept: [phone]  and follow the prompts.  Office hours are 8:00 a.m. to 4:30 p.m. Monday - Friday. Please note that voicemails left after 4:00 p.m. may not be returned until the following business day.  We are closed weekends and major holidays. You have access to a nurse at all times for urgent questions. Please call the main number to the clinic Dept: [phone] and follow the prompts.   For any non-urgent questions, you may also contact your provider using MyChart. We now offer e-Visits for anyone 39 and older to request care online for non-urgent symptoms. For details visit [email or web address].   Also download the MyChart app! Go to the app store, search "MyChart", open the app, select [place], and log in with your MyChart username and password.

## 2023-10-30 ENCOUNTER — Ambulatory Visit: Payer: No Typology Code available for payment source | Admitting: Hematology and Oncology

## 2023-10-30 ENCOUNTER — Other Ambulatory Visit: Payer: No Typology Code available for payment source

## 2023-10-31 ENCOUNTER — Inpatient Hospital Stay: Payer: No Typology Code available for payment source

## 2023-10-31 VITALS — BP 122/80 | HR 89 | Temp 98.5°F | Resp 18

## 2023-10-31 DIAGNOSIS — C50312 Malignant neoplasm of lower-inner quadrant of left female breast: Secondary | ICD-10-CM

## 2023-10-31 DIAGNOSIS — Z5111 Encounter for antineoplastic chemotherapy: Secondary | ICD-10-CM | POA: Diagnosis not present

## 2023-10-31 MED ORDER — PEGFILGRASTIM-JMDB 6 MG/0.6ML ~~LOC~~ SOSY
6.0000 mg | PREFILLED_SYRINGE | Freq: Once | SUBCUTANEOUS | Status: AC
Start: 2023-10-31 — End: 2023-10-31
  Administered 2023-10-31: 6 mg via SUBCUTANEOUS
  Filled 2023-10-31: qty 0.6

## 2023-11-04 ENCOUNTER — Telehealth: Payer: Self-pay | Admitting: Hematology and Oncology

## 2023-11-04 NOTE — Telephone Encounter (Signed)
 Scheduled appointments per WQ. Talked with the patients and she is aware of the made appointments.

## 2023-11-05 ENCOUNTER — Other Ambulatory Visit: Payer: Self-pay | Admitting: *Deleted

## 2023-11-05 ENCOUNTER — Encounter: Payer: Self-pay | Admitting: Nurse Practitioner

## 2023-11-05 ENCOUNTER — Inpatient Hospital Stay (HOSPITAL_BASED_OUTPATIENT_CLINIC_OR_DEPARTMENT_OTHER): Admitting: Nurse Practitioner

## 2023-11-05 ENCOUNTER — Ambulatory Visit (HOSPITAL_COMMUNITY)
Admission: RE | Admit: 2023-11-05 | Discharge: 2023-11-05 | Disposition: A | Discharge status: Home / Self Care | Source: Ambulatory Visit | Attending: Nurse Practitioner | Admitting: Nurse Practitioner

## 2023-11-05 VITALS — BP 118/70 | HR 115 | Temp 97.9°F | Resp 20 | Wt 160.9 lb

## 2023-11-05 DIAGNOSIS — Z5111 Encounter for antineoplastic chemotherapy: Secondary | ICD-10-CM | POA: Diagnosis not present

## 2023-11-05 DIAGNOSIS — Z170 Estrogen receptor positive status [ER+]: Secondary | ICD-10-CM | POA: Diagnosis not present

## 2023-11-05 DIAGNOSIS — R062 Wheezing: Secondary | ICD-10-CM | POA: Insufficient documentation

## 2023-11-05 DIAGNOSIS — C50312 Malignant neoplasm of lower-inner quadrant of left female breast: Secondary | ICD-10-CM | POA: Diagnosis not present

## 2023-11-05 MED ORDER — METHYLPREDNISOLONE 4 MG PO TBPK: ORAL_TABLET | ORAL | 0 refills | Status: DC

## 2023-11-05 MED ORDER — ALBUTEROL SULFATE HFA 108 (90 BASE) MCG/ACT IN AERS: 2.0000 | INHALATION_SPRAY | Freq: Four times a day (QID) | RESPIRATORY_TRACT | 1 refills | Status: DC | PRN

## 2023-11-05 NOTE — Progress Notes (Signed)
 Received call from pt stating she has completed Amoxicillin for cold symptoms but is now experiencing wheezing and chest congestion. Per MD pt needing [facility] visit and STAT chest xray.  Orders placed, appt scheduled, pt notified and verbalized understanding.

## 2023-11-05 NOTE — Progress Notes (Signed)
 Patient Care Team: Letta Kocher, PA-C as PCP - General (Family Medicine) Donnelly Angelica, RN as Oncology Nurse Navigator Pershing Proud, RN as Oncology Nurse Navigator Emelia Loron, MD as Consulting Physician (General Surgery) Serena Croissant, MD as Consulting Physician (Hematology and Oncology) Dorothy Puffer, MD as Consulting Physician (Radiation Oncology)   CHIEF COMPLAINT: Cough, wheezing  INTERVAL HISTORY Ms. Dupriest presents for symptom management visit. Last seen 3/11 for C2 AC and gcsf, she was given amoxicillin for a cold at that time. She feels the head cold has improved but "moved to my chest" with cough and wheezing. Afebrile. Tolerating po. Did not check flu/covid. Boyfriend and son have similar symptoms, son negative for flu and strep. She has fatigue and constipation after chemo but that has improved.   ROS  All other systems reviewed and negative  Past Medical History:  Diagnosis Date   Cancer (HCC)    Depression    GAD (generalized anxiety disorder)    Dr. Ann Maki [place]--sees her regularly   Hypothyroidism    s/p radioactive iodine ablation 2010    PONV (postoperative nausea and vomiting)    PONV during c section   Retropharyngeal abscess 08/21/1995     Past Surgical History:  Procedure Laterality Date   BREAST BIOPSY Left 08/20/2023   [place] LT BREAST BX W LOC DEV 1ST LESION IMG BX SPEC US GUIDE 08/20/2023 [facility]   BREAST BIOPSY Left 09/16/2023   [place] LT RADIOACTIVE SEED LOC 09/16/2023 [facility]   BREAST BIOPSY Left 09/16/2023   [place] LT RADIOACTIVE SEED EA ADD LESION 09/16/2023 [facility]   BREAST LUMPECTOMY WITH RADIOACTIVE SEED AND SENTINEL LYMPH NODE BIOPSY Left 09/17/2023   Procedure: LEFT BREAST SEED GUIDED LUMPECTOMY, LEFT AXILLARY SENTINEL NODE BIOPSY;  Surgeon: Emelia Loron, MD;  Location: [facility];  Service: General;  Laterality: Left;  GEN PEC BLOCK   CESAREAN SECTION  10/11/2011   Procedure: CESAREAN SECTION;   Surgeon: Meriel Pica, MD;  Location: [facility];  Service: Gynecology;  Laterality: N/A;   digital reconstructive surgery (middle finger of L hand)     PORTACATH PLACEMENT Right 10/07/2023   Procedure: INSERTION PORT-A-CATH WITH ULTRASOUND GUIDANCE;  Surgeon: Emelia Loron, MD;  Location: [facility];  Service: General;  Laterality: Right;   RADIOACTIVE SEED GUIDED AXILLARY SENTINEL LYMPH NODE Left 09/17/2023   Procedure: LEFT AXILLARY NODE SEED GUIDED EXCISION;  Surgeon: Emelia Loron, MD;  Location: [facility];  Service: General;  Laterality: Left;   TONSILLECTOMY  1999   WISDOM TOOTH EXTRACTION       Outpatient Encounter Medications as of 11/05/2023  Medication Sig   albuterol (VENTOLIN HFA) 108 (90 Base) MCG/ACT inhaler Inhale 2 puffs into the lungs every 6 (six) hours as needed for wheezing or shortness of breath.   amoxicillin (AMOXIL) 500 MG capsule Take 1 capsule (500 mg total) by mouth 3 (three) times daily.   Collagen Hydrolysate POWD 2 Scoops by Does not apply route.   dexamethasone (DECADRON) 4 MG tablet Take 1 tablet by mouth daily for 2 days. Start the day after doxorubicin/cyclophosphamide chemotherapy. Take with food.   levothyroxine (SYNTHROID) 125 MCG tablet Take 125 mcg by mouth daily before breakfast.   lidocaine-prilocaine (EMLA) cream Apply to affected area once   medium chain triglycerides (MCT OIL) oil Take by mouth 3 (three) times daily.   methylPREDNISolone (MEDROL DOSEPAK) 4 MG TBPK tablet Per package instructions   ondansetron (ZOFRAN) 8 MG tablet Take 1 tab (  8 mg) by mouth every 8 hrs as needed for nausea/vomiting. Start third day after doxorubicin/cyclophosphamide chemotherapy.   prochlorperazine (COMPAZINE) 10 MG tablet Take 1 tablet (10 mg total) by mouth every 6 (six) hours as needed for nausea or vomiting.   venlafaxine XR (EFFEXOR-XR) 150 MG 24 hr capsule Take 150 mg by mouth daily.   venlafaxine XR (EFFEXOR-XR) 75 MG 24 hr capsule Take 75  mg by mouth daily.   VRAYLAR 1.5 MG capsule Take 1.5 mg by mouth every other day.   No facility-administered encounter medications on file as of 11/05/2023.     Today's Vitals   11/05/23 1258  BP: 118/70  Pulse: (!) 115  Resp: 20  Temp: 97.9 F (36.6 C)  SpO2: 97%  Weight: 160 lb 14.4 oz (73 kg)   Body mass index is 25.97 kg/m.   HR up to 128 and O2 sat 95-96% on RA ambulatory   PHYSICAL EXAM GENERAL:alert, no distress and comfortable SKIN: no rash  EYES: sclera clear LUNGS: inspiratory and expiratory wheezes throughout, normal breathing effort HEART: tachycardic, regular rhythm NEURO: alert & oriented x 3 with fluent speech, no focal motor/sensory deficits PAC without erythema    CBC    Component Value Date/Time   WBC 11.6 (H) 10/29/2023 1134   WBC 12.4 (H) 10/12/2011 0539   RBC 4.35 10/29/2023 1134   HGB 13.3 10/29/2023 1134   HCT 38.7 10/29/2023 1134   PLT 177 10/29/2023 1134   MCV 89.0 10/29/2023 1134   MCH 30.6 10/29/2023 1134   MCHC 34.4 10/29/2023 1134   RDW 12.3 10/29/2023 1134   LYMPHSABS 1.4 10/29/2023 1134   MONOABS 1.0 10/29/2023 1134   EOSABS 0.3 10/29/2023 1134   BASOSABS 0.3 (H) 10/29/2023 1134     CMP     Component Value Date/Time   NA 138 10/29/2023 1134   K 4.2 10/29/2023 1134   CL 104 10/29/2023 1134   CO2 28 10/29/2023 1134   GLUCOSE 109 (H) 10/29/2023 1134   BUN 13 10/29/2023 1134   CREATININE 0.78 10/29/2023 1134   CALCIUM 9.1 10/29/2023 1134   PROT 7.0 10/29/2023 1134   ALBUMIN 4.3 10/29/2023 1134   AST 20 10/29/2023 1134   ALT 20 10/29/2023 1134   ALKPHOS 120 10/29/2023 1134   BILITOT 0.3 10/29/2023 1134   GFRNONAA >60 10/29/2023 1134     ASSESSMENT & PLAN: 48 year old female on adjuvant chemo for ER positive left breast cancer and 1-2 week h/o respiratory illness   Respiratory illness -Symptoms began ~2 weeks ago with cough, headache, fatigue -Proceeded with chemo 3/11 and gcsf and completed Amoxicillin  -Headache  resolved but she has persistent cough and worsening wheezing -Afebrile.  No hypoxia on ambulation.  She is tachy at baseline which slightly increases on walk test but returned to baseline at rest -Preferred to give breathing treatment here then re-assess, but we don't do nebs since COVID -Prescribed albuterol inh and medrol dose pack to her pharmacy, reviewed return/ED precautions -CXR looks OK but will await final read and broaden abx if needed -Return 11/12/23 for C3 [facility] as scheduled, or sooner if symptoms worsen or fail to improve    PLAN: -Nebs not available in clinic since covid times -Rx: Medrol dose pack and albuterol inh for wheezing/respiratory illness -Await CXR and broaden abx if needed -Return/ED precautions reviewed -F/up and C3 AC 3/25 as scheduled, or sooner if needed -Dr. Pamelia Hoit notified    All questions were answered. The patient knows to call  the clinic with any problems, questions or concerns. No barriers to learning were detected. I spent 20 minutes counseling the patient face to face. The total time spent in the appointment was 30 minutes and more than 50% was on counseling, review of test results, and coordination of care.   Santiago Glad, NP-C 11/05/2023

## 2023-11-05 NOTE — Progress Notes (Signed)
 Patient is here to be seen for cough, SOB, and wheezing. She has had these symptoms for the past 2 weeks.

## 2023-11-06 ENCOUNTER — Encounter: Payer: Self-pay | Admitting: Nurse Practitioner

## 2023-11-06 ENCOUNTER — Other Ambulatory Visit: Payer: Self-pay

## 2023-11-11 ENCOUNTER — Encounter: Payer: Self-pay | Admitting: *Deleted

## 2023-11-11 MED FILL — Fosaprepitant Dimeglumine For IV Infusion 150 MG (Base Eq): INTRAVENOUS | Qty: 5 | Status: AC

## 2023-11-12 ENCOUNTER — Inpatient Hospital Stay: Payer: No Typology Code available for payment source

## 2023-11-12 ENCOUNTER — Inpatient Hospital Stay (HOSPITAL_BASED_OUTPATIENT_CLINIC_OR_DEPARTMENT_OTHER): Payer: No Typology Code available for payment source | Admitting: Hematology and Oncology

## 2023-11-12 VITALS — BP 122/76 | HR 91 | Temp 98.0°F | Resp 18 | Ht 66.0 in | Wt 162.4 lb

## 2023-11-12 DIAGNOSIS — Z170 Estrogen receptor positive status [ER+]: Secondary | ICD-10-CM

## 2023-11-12 DIAGNOSIS — Z5111 Encounter for antineoplastic chemotherapy: Secondary | ICD-10-CM | POA: Diagnosis not present

## 2023-11-12 DIAGNOSIS — C50312 Malignant neoplasm of lower-inner quadrant of left female breast: Secondary | ICD-10-CM

## 2023-11-12 DIAGNOSIS — Z95828 Presence of other vascular implants and grafts: Secondary | ICD-10-CM

## 2023-11-12 LAB — CMP (CANCER CENTER ONLY)
ALT: 16 U/L (ref 0–44)
AST: 16 U/L (ref 15–41)
Albumin: 4.3 g/dL (ref 3.5–5.0)
Alkaline Phosphatase: 126 U/L (ref 38–126)
Anion gap: 5 (ref 5–15)
BUN: 16 mg/dL (ref 6–20)
CO2: 29 mmol/L (ref 22–32)
Calcium: 9.4 mg/dL (ref 8.9–10.3)
Chloride: 105 mmol/L (ref 98–111)
Creatinine: 0.78 mg/dL (ref 0.44–1.00)
GFR, Estimated: >60 mL/min
Glucose, Bld: 89 mg/dL (ref 70–99)
Potassium: 3.9 mmol/L (ref 3.5–5.1)
Sodium: 139 mmol/L (ref 135–145)
Total Bilirubin: 0.3 mg/dL (ref 0.0–1.2)
Total Protein: 7.1 g/dL (ref 6.5–8.1)

## 2023-11-12 LAB — CBC WITH DIFFERENTIAL (CANCER CENTER ONLY)
Abs Immature Granulocytes: 2.03 10*3/uL — ABNORMAL HIGH (ref 0.00–0.07)
Basophils Absolute: 0.1 10*3/uL (ref 0.0–0.1)
Basophils Relative: 1 %
Eosinophils Absolute: 0.1 10*3/uL (ref 0.0–0.5)
Eosinophils Relative: 1 %
HCT: 38 % (ref 36.0–46.0)
Hemoglobin: 13 g/dL (ref 12.0–15.0)
Immature Granulocytes: 12 %
Lymphocytes Relative: 20 %
Lymphs Abs: 3.3 10*3/uL (ref 0.7–4.0)
MCH: 30.2 pg (ref 26.0–34.0)
MCHC: 34.2 g/dL (ref 30.0–36.0)
MCV: 88.4 fL (ref 80.0–100.0)
Monocytes Absolute: 0.9 10*3/uL (ref 0.1–1.0)
Monocytes Relative: 6 %
Neutro Abs: 10 10*3/uL — ABNORMAL HIGH (ref 1.7–7.7)
Neutrophils Relative %: 60 %
Platelet Count: 159 10*3/uL (ref 150–400)
RBC: 4.3 MIL/uL (ref 3.87–5.11)
RDW: 13.2 % (ref 11.5–15.5)
WBC Count: 16.5 10*3/uL — ABNORMAL HIGH (ref 4.0–10.5)
nRBC: 0.5 % — ABNORMAL HIGH (ref 0.0–0.2)

## 2023-11-12 LAB — PREGNANCY, URINE: Preg Test, Ur: NEGATIVE

## 2023-11-12 MED ORDER — SODIUM CHLORIDE 0.9 % IV SOLN
150.0000 mg | Freq: Once | INTRAVENOUS | Status: AC
  Administered 2023-11-12: 150 mg via INTRAVENOUS
  Filled 2023-11-12: qty 150

## 2023-11-12 MED ORDER — SODIUM CHLORIDE 0.9 % IV SOLN
INTRAVENOUS | Status: DC
Start: 2023-11-12 — End: 2023-11-12

## 2023-11-12 MED ORDER — DEXAMETHASONE SODIUM PHOSPHATE 10 MG/ML IJ SOLN
10.0000 mg | Freq: Once | INTRAMUSCULAR | Status: AC
  Administered 2023-11-12: 10 mg via INTRAVENOUS
  Filled 2023-11-12: qty 1

## 2023-11-12 MED ORDER — SODIUM CHLORIDE 0.9% FLUSH
10.0000 mL | INTRAVENOUS | Status: DC | PRN
  Administered 2023-11-12: 10 mL

## 2023-11-12 MED ORDER — SODIUM CHLORIDE 0.9 % IV SOLN
600.0000 mg/m2 | Freq: Once | INTRAVENOUS | Status: AC
  Administered 2023-11-12: 1000 mg via INTRAVENOUS
  Filled 2023-11-12: qty 50

## 2023-11-12 MED ORDER — PALONOSETRON HCL INJECTION 0.25 MG/5ML
0.2500 mg | Freq: Once | INTRAVENOUS | Status: AC
  Administered 2023-11-12: 0.25 mg via INTRAVENOUS
  Filled 2023-11-12: qty 5

## 2023-11-12 MED ORDER — BENZONATATE 200 MG PO CAPS: 200.0000 mg | ORAL_CAPSULE | Freq: Three times a day (TID) | ORAL | 0 refills | Status: DC | PRN

## 2023-11-12 MED ORDER — DOXORUBICIN HCL CHEMO IV INJECTION 2 MG/ML
60.0000 mg/m2 | Freq: Once | INTRAVENOUS | Status: AC
  Administered 2023-11-12: 108 mg via INTRAVENOUS
  Filled 2023-11-12: qty 54

## 2023-11-12 MED ORDER — SODIUM CHLORIDE 0.9% FLUSH
10.0000 mL | Freq: Once | INTRAVENOUS | Status: AC
  Administered 2023-11-12: 10 mL

## 2023-11-12 MED ORDER — HEPARIN SOD (PORK) LOCK FLUSH 100 UNIT/ML IV SOLN
500.0000 [IU] | Freq: Once | INTRAVENOUS | Status: AC | PRN
  Administered 2023-11-12: 500 [IU]

## 2023-11-12 NOTE — Assessment & Plan Note (Signed)
 08/20/2023:Screening mammogram detected left breast distortion at 7 o'clock position measuring 1.2 cm.  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 Adjuvant radiation therapy Adjuvant antiestrogen therapy with ovarian function suppression and anastrozole, along with Verzenio --------------------------------------------------------------------------------------------------------------------- Current treatment: Cycle 3 dose dense Adriamycin and Cytoxan Chemo toxicities: Severe fatigue for 2 to 3 days Upper respiratory infection: I sent a prescription for antibiotics with amoxicillin.   Denied any nausea or vomiting.  Mild constipation but relieved easily.   Return to clinic in 2 weeks for cycle 4

## 2023-11-12 NOTE — Progress Notes (Signed)
 Patient Care Team: Letta Kocher, PA-C as PCP - General (Family Medicine) Donnelly Angelica, RN as Oncology Nurse Navigator Pershing Proud, RN as Oncology Nurse Navigator Emelia Loron, MD as Consulting Physician (General Surgery) Serena Croissant, MD as Consulting Physician (Hematology and Oncology) Dorothy Puffer, MD as Consulting Physician (Radiation Oncology)  DIAGNOSIS:  Encounter Diagnosis  Name Primary?   Malignant neoplasm of lower-inner quadrant of left breast in female, estrogen receptor positive (HCC) Yes    SUMMARY OF ONCOLOGIC HISTORY: Oncology History  Malignant neoplasm of lower-inner quadrant of left breast in female, estrogen receptor positive (HCC)  08/20/2023 Initial Diagnosis   Screening mammogram detected left breast distortion at 7 o'clock position measuring 1.2 cm.  Extra lymph node biopsy benign, biopsy of the breast: Grade 2 IDC ER 90%, PR 90%, HER2 negative by FISH, Ki-67 5%   08/26/2023 Cancer Staging   Staging form: Breast, AJCC 8th Edition - Clinical stage from 08/26/2023: Stage IA (cT1c, cN0(f), cM0, G2, ER+, PR+, HER2-) - Signed by Serena Croissant, MD on 08/28/2023 Stage prefix: Initial diagnosis Method of lymph node assessment: Core biopsy Histologic grading system: 3 grade system   09/05/2023 Genetic Testing   Negative [facility] CancerNext-Expanded +RNAinsight Panel.  Report date is 09/05/2023.   The CancerNext-Expanded gene panel offered by [facility] and includes sequencing, rearrangement, and RNA analysis for the following 76 genes: AIP, ALK, APC, ATM, AXIN2, BAP1, BARD1, BMPR1A, BRCA1, BRCA2, BRIP1, CDC73, CDH1, CDK4, CDKN1B, CDKN2A, CEBPA, CHEK2, CTNNA1, DDX41, DICER1, ETV6, FH, FLCN, GATA2, LZTR1, MAX, MBD4, MEN1, MET, MLH1, MSH2, MSH3, MSH6, MUTYH, NF1, NF2, NTHL1, PALB2, PHOX2B, PMS2, POT1, PRKAR1A, PTCH1, PTEN, RAD51C, RAD51D, RB1, RET, RUNX1, SDHA, SDHAF2, SDHB, SDHC, SDHD, SMAD4, SMARCA4, SMARCB1, SMARCE1, STK11, SUFU, TMEM127, TP53, TSC1,  TSC2, VHL, and WT1 (sequencing and deletion/duplication); EGFR, HOXB13, KIT, MITF, PDGFRA, POLD1, and POLE (sequencing only); EPCAM and GREM1 (deletion/duplication only).    10/15/2023 -  Chemotherapy   Patient is on Treatment Plan : BREAST DOSE DENSE AC q14d / PACLitaxel q7d       CHIEF COMPLIANT: Cycle 3 Adriamycin and Cytoxan  HISTORY OF PRESENT ILLNESS:  History of Present Illness The patient, with a history of allergies, presents with a persistent cough that has been ongoing for a prolonged period. She reports a slight improvement in symptoms after taking steroids, but still experiences some wheezing. She denies having a fever and her appetite and taste are generally intact, although she occasionally experiences a metallic taste. She has been recommended Tessalon Perles by a friend, which she is interested in trying as she has heard it can be effective for cough relief.  In addition to her cough, the patient is also undergoing treatment for an unspecified condition, which requires regular infusions and injections. She reports that her bowel movements are regular and she has been drinking plenty of water. She is currently on disability leave from work, but there seems to be a discrepancy in the paperwork regarding the anticipated return to work date.     ALLERGIES:  is allergic to methimazole.  MEDICATIONS:  Current Outpatient Medications  Medication Sig Dispense Refill   benzonatate (TESSALON) 200 MG capsule Take 1 capsule (200 mg total) by mouth 3 (three) times daily as needed for cough. 20 capsule 0   albuterol (VENTOLIN HFA) 108 (90 Base) MCG/ACT inhaler Inhale 2 puffs into the lungs every 6 (six) hours as needed for wheezing or shortness of breath. 8 g 1   amoxicillin (AMOXIL) 500 MG capsule Take  1 capsule (500 mg total) by mouth 3 (three) times daily. 14 capsule 0   Collagen Hydrolysate POWD 2 Scoops by Does not apply route.     dexamethasone (DECADRON) 4 MG tablet Take 1 tablet  by mouth daily for 2 days. Start the day after doxorubicin/cyclophosphamide chemotherapy. Take with food. 8 tablet 0   levothyroxine (SYNTHROID) 125 MCG tablet Take 125 mcg by mouth daily before breakfast.     lidocaine-prilocaine (EMLA) cream Apply to affected area once 30 g 3   medium chain triglycerides (MCT OIL) oil Take by mouth 3 (three) times daily.     methylPREDNISolone (MEDROL DOSEPAK) 4 MG TBPK tablet Per package instructions 21 each 0   ondansetron (ZOFRAN) 8 MG tablet Take 1 tab (8 mg) by mouth every 8 hrs as needed for nausea/vomiting. Start third day after doxorubicin/cyclophosphamide chemotherapy. 30 tablet 1   prochlorperazine (COMPAZINE) 10 MG tablet Take 1 tablet (10 mg total) by mouth every 6 (six) hours as needed for nausea or vomiting. 30 tablet 1   venlafaxine XR (EFFEXOR-XR) 150 MG 24 hr capsule Take 150 mg by mouth daily.     venlafaxine XR (EFFEXOR-XR) 75 MG 24 hr capsule Take 75 mg by mouth daily.     VRAYLAR 1.5 MG capsule Take 1.5 mg by mouth every other day.     No current facility-administered medications for this visit.   Facility-Administered Medications Ordered in Other Visits  Medication Dose Route Frequency Provider Last Rate Last Admin   0.9 %  sodium chloride infusion   Intravenous Continuous Serena Croissant, MD 10 mL/hr at 11/12/23 1251 New Bag at 11/12/23 1251   cyclophosphamide (CYTOXAN) 1,000 mg in sodium chloride 0.9 % 250 mL chemo infusion  600 mg/m2 (Treatment Plan Recorded) Intravenous Once Serena Croissant, MD       DOXOrubicin (ADRIAMYCIN) chemo injection 108 mg  60 mg/m2 (Treatment Plan Recorded) Intravenous Once Serena Croissant, MD       heparin lock flush 100 unit/mL  500 Units Intracatheter Once PRN Serena Croissant, MD       sodium chloride flush (NS) 0.9 % injection 10 mL  10 mL Intracatheter PRN Serena Croissant, MD        PHYSICAL EXAMINATION: ECOG PERFORMANCE STATUS: 1 - Symptomatic but completely ambulatory  Vitals:   11/12/23 1202  BP: 122/76   Pulse: 91  Resp: 18  Temp: 98 F (36.7 C)  SpO2: 96%   Filed Weights   11/12/23 1202  Weight: 162 lb 6.4 oz (73.7 kg)    Physical Exam   (exam performed in the presence of a chaperone)  LABORATORY DATA:  I have reviewed the data as listed    Latest Ref Rng & Units 11/12/2023   11:09 AM 10/29/2023   11:34 AM 10/22/2023    1:58 PM  CMP  Glucose 70 - 99 mg/dL 89  811  914   BUN 6 - 20 mg/dL 16  13  13    Creatinine 0.44 - 1.00 mg/dL 7.82  9.56  2.13   Sodium 135 - 145 mmol/L 139  138  137   Potassium 3.5 - 5.1 mmol/L 3.9  4.2  4.1   Chloride 98 - 111 mmol/L 105  104  105   CO2 22 - 32 mmol/L 29  28  25    Calcium 8.9 - 10.3 mg/dL 9.4  9.1  9.3   Total Protein 6.5 - 8.1 g/dL 7.1  7.0  6.9   Total Bilirubin 0.0 - 1.2 mg/dL  0.3  0.3  0.4   Alkaline Phos 38 - 126 U/L 126  120  103   AST 15 - 41 U/L 16  20  14    ALT 0 - 44 U/L 16  20  22      Lab Results  Component Value Date   WBC 16.5 (H) 11/12/2023   HGB 13.0 11/12/2023   HCT 38.0 11/12/2023   MCV 88.4 11/12/2023   PLT 159 11/12/2023   NEUTROABS 10.0 (H) 11/12/2023    ASSESSMENT & PLAN:  Malignant neoplasm of lower-inner quadrant of left breast in female, estrogen receptor positive (HCC) 08/20/2023:Screening mammogram detected left breast distortion at 7 o'clock position measuring 1.2 cm.  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 Adjuvant radiation therapy Adjuvant antiestrogen therapy with ovarian function suppression and anastrozole, along with  Verzenio --------------------------------------------------------------------------------------------------------------------- Current treatment: Cycle 3 dose dense Adriamycin and Cytoxan Chemo toxicities: Severe fatigue for 2 to 3 days Upper respiratory infection: Sent prescription for [employer].   Denied any nausea or vomiting.  Mild constipation but relieved easily.   Return to clinic in 2 weeks for cycle 4 ------------------------------------- Assessment and Plan Assessment & Plan Malignant neoplasm of lower-inner quadrant of left breast, estrogen receptor positive Undergoing treatment with Taxol for estrogen receptor-positive breast cancer. Blood counts are stable. Discussed disability paperwork and return to work timing. - Continue chemotherapy with Taxol as scheduled. - Monitor blood counts and overall health during treatment. - Support request for disability extension. - Provide necessary documentation for disability extension.  Fatigue post-chemotherapy Experiencing fatigue as a side effect of chemotherapy. Discussed management and importance of rest. - Encourage rest and monitor fatigue levels. - Discuss management strategies for fatigue.  Upper respiratory tract infection Improvement in wheezing and cough, indicating resolution. Metallic taste noted. Tessalon Perles discussed for cough relief. - Prescribe Tessalon Perles for cough relief. - Monitor symptoms and consider antibiotics if symptoms worsen.  Follow-up Scheduled for infusion and injection. Next follow-up on April 8th. - Ensure she receives infusion and injection as scheduled. - Confirm follow-up appointment on April 8th.      No orders of the defined types were placed in this encounter.  The patient has a good understanding of the overall plan. she agrees with it. she will call with any problems that may develop before the next visit here. Total time spent: 30 mins including face to face time and  time spent for planning, charting and co-ordination of care   Tamsen Meek, MD 11/12/23

## 2023-11-14 ENCOUNTER — Inpatient Hospital Stay: Payer: No Typology Code available for payment source

## 2023-11-14 ENCOUNTER — Other Ambulatory Visit: Payer: Self-pay

## 2023-11-14 VITALS — BP 118/71 | HR 106 | Resp 17

## 2023-11-14 DIAGNOSIS — Z5111 Encounter for antineoplastic chemotherapy: Secondary | ICD-10-CM | POA: Diagnosis not present

## 2023-11-14 DIAGNOSIS — Z170 Estrogen receptor positive status [ER+]: Secondary | ICD-10-CM

## 2023-11-14 MED ORDER — PEGFILGRASTIM-JMDB 6 MG/0.6ML ~~LOC~~ SOSY
6.0000 mg | PREFILLED_SYRINGE | Freq: Once | SUBCUTANEOUS | Status: AC
  Administered 2023-11-14: 6 mg via SUBCUTANEOUS
  Filled 2023-11-14: qty 0.6

## 2023-11-21 ENCOUNTER — Telehealth: Payer: Self-pay

## 2023-11-21 NOTE — Telephone Encounter (Signed)
 Notified the pt to let her know the her [employer] Disability forms were updated and resubmitted with confirmation in return. Pt copy was emailed as requested. Pt had no questions or concerns at this time.

## 2023-11-25 MED FILL — Fosaprepitant Dimeglumine For IV Infusion 150 MG (Base Eq): INTRAVENOUS | Qty: 5 | Status: AC

## 2023-11-26 ENCOUNTER — Inpatient Hospital Stay: Payer: No Typology Code available for payment source

## 2023-11-26 ENCOUNTER — Inpatient Hospital Stay (HOSPITAL_BASED_OUTPATIENT_CLINIC_OR_DEPARTMENT_OTHER): Payer: No Typology Code available for payment source | Admitting: Hematology and Oncology

## 2023-11-26 ENCOUNTER — Other Ambulatory Visit: Payer: Self-pay

## 2023-11-26 ENCOUNTER — Inpatient Hospital Stay: Payer: No Typology Code available for payment source | Attending: Hematology and Oncology

## 2023-11-26 VITALS — BP 114/77 | HR 105 | Temp 97.9°F | Resp 18 | Ht 66.0 in | Wt 162.4 lb

## 2023-11-26 VITALS — HR 99

## 2023-11-26 DIAGNOSIS — Z170 Estrogen receptor positive status [ER+]: Secondary | ICD-10-CM | POA: Insufficient documentation

## 2023-11-26 DIAGNOSIS — Z5189 Encounter for other specified aftercare: Secondary | ICD-10-CM | POA: Insufficient documentation

## 2023-11-26 DIAGNOSIS — C50312 Malignant neoplasm of lower-inner quadrant of left female breast: Secondary | ICD-10-CM | POA: Insufficient documentation

## 2023-11-26 DIAGNOSIS — Z95828 Presence of other vascular implants and grafts: Secondary | ICD-10-CM

## 2023-11-26 DIAGNOSIS — Z5111 Encounter for antineoplastic chemotherapy: Secondary | ICD-10-CM | POA: Diagnosis present

## 2023-11-26 LAB — CBC WITH DIFFERENTIAL (CANCER CENTER ONLY)
Abs Immature Granulocytes: 1.67 10*3/uL — ABNORMAL HIGH (ref 0.00–0.07)
Basophils Absolute: 0.2 10*3/uL — ABNORMAL HIGH (ref 0.0–0.1)
Basophils Relative: 1 %
Eosinophils Absolute: 0.1 10*3/uL (ref 0.0–0.5)
Eosinophils Relative: 0 %
HCT: 34.2 % — ABNORMAL LOW (ref 36.0–46.0)
Hemoglobin: 11.9 g/dL — ABNORMAL LOW (ref 12.0–15.0)
Immature Granulocytes: 9 %
Lymphocytes Relative: 9 %
Lymphs Abs: 1.7 10*3/uL (ref 0.7–4.0)
MCH: 30.8 pg (ref 26.0–34.0)
MCHC: 34.8 g/dL (ref 30.0–36.0)
MCV: 88.6 fL (ref 80.0–100.0)
Monocytes Absolute: 1.1 10*3/uL — ABNORMAL HIGH (ref 0.1–1.0)
Monocytes Relative: 6 %
Neutro Abs: 13.8 10*3/uL — ABNORMAL HIGH (ref 1.7–7.7)
Neutrophils Relative %: 75 %
Platelet Count: 148 10*3/uL — ABNORMAL LOW (ref 150–400)
RBC: 3.86 MIL/uL — ABNORMAL LOW (ref 3.87–5.11)
RDW: 14.2 % (ref 11.5–15.5)
WBC Count: 18.6 10*3/uL — ABNORMAL HIGH (ref 4.0–10.5)
nRBC: 0.3 % — ABNORMAL HIGH (ref 0.0–0.2)

## 2023-11-26 LAB — CMP (CANCER CENTER ONLY)
ALT: 15 U/L (ref 0–44)
AST: 15 U/L (ref 15–41)
Albumin: 4.3 g/dL (ref 3.5–5.0)
Alkaline Phosphatase: 167 U/L — ABNORMAL HIGH (ref 38–126)
Anion gap: 5 (ref 5–15)
BUN: 10 mg/dL (ref 6–20)
CO2: 26 mmol/L (ref 22–32)
Calcium: 9.5 mg/dL (ref 8.9–10.3)
Chloride: 106 mmol/L (ref 98–111)
Creatinine: 0.68 mg/dL (ref 0.44–1.00)
GFR, Estimated: >60 mL/min (ref >60)
Glucose, Bld: 90 mg/dL (ref 70–99)
Potassium: 4.2 mmol/L (ref 3.5–5.1)
Sodium: 137 mmol/L (ref 135–145)
Total Bilirubin: 0.4 mg/dL (ref 0.0–1.2)
Total Protein: 7.4 g/dL (ref 6.5–8.1)

## 2023-11-26 MED ORDER — PALONOSETRON HCL INJECTION 0.25 MG/5ML
0.2500 mg | Freq: Once | INTRAVENOUS | Status: AC
  Administered 2023-11-26: 0.25 mg via INTRAVENOUS
  Filled 2023-11-26: qty 5

## 2023-11-26 MED ORDER — SODIUM CHLORIDE 0.9% FLUSH
10.0000 mL | INTRAVENOUS | Status: DC | PRN
  Administered 2023-11-26: 10 mL

## 2023-11-26 MED ORDER — SODIUM CHLORIDE 0.9 % IV SOLN: INTRAVENOUS | Status: DC

## 2023-11-26 MED ORDER — DEXAMETHASONE SODIUM PHOSPHATE 10 MG/ML IJ SOLN
10.0000 mg | Freq: Once | INTRAMUSCULAR | Status: AC
  Administered 2023-11-26: 10 mg via INTRAVENOUS
  Filled 2023-11-26: qty 1

## 2023-11-26 MED ORDER — HEPARIN SOD (PORK) LOCK FLUSH 100 UNIT/ML IV SOLN
500.0000 [IU] | Freq: Once | INTRAVENOUS | Status: AC | PRN
  Administered 2023-11-26: 500 [IU]

## 2023-11-26 MED ORDER — DOXORUBICIN HCL CHEMO IV INJECTION 2 MG/ML
60.0000 mg/m2 | Freq: Once | INTRAVENOUS | Status: AC
  Administered 2023-11-26: 108 mg via INTRAVENOUS
  Filled 2023-11-26: qty 54

## 2023-11-26 MED ORDER — SODIUM CHLORIDE 0.9% FLUSH
10.0000 mL | Freq: Once | INTRAVENOUS | Status: AC
  Administered 2023-11-26: 10 mL

## 2023-11-26 MED ORDER — SODIUM CHLORIDE 0.9 % IV SOLN
150.0000 mg | Freq: Once | INTRAVENOUS | Status: AC
  Administered 2023-11-26: 150 mg via INTRAVENOUS
  Filled 2023-11-26: qty 150

## 2023-11-26 MED ORDER — SODIUM CHLORIDE 0.9 % IV SOLN
600.0000 mg/m2 | Freq: Once | INTRAVENOUS | Status: AC
  Administered 2023-11-26: 1000 mg via INTRAVENOUS
  Filled 2023-11-26: qty 50

## 2023-11-26 NOTE — Patient Instructions (Signed)
 [facility] - A DEPT OF [facility]  Discharge Instructions: Thank you for choosing [facility] to provide your oncology and hematology care.   If you have a lab appointment with the Cancer Center, please go directly to the Cancer Center and check in at the registration area.   Wear comfortable clothing and clothing appropriate for easy access to any Portacath or PICC line.   We strive to give you quality time with your provider. You may need to reschedule your appointment if you arrive late (15 or more minutes).  Arriving late affects you and other patients whose appointments are after yours.  Also, if you miss three or more appointments without notifying the office, you may be dismissed from the clinic at the provider's discretion.      For prescription refill requests, have your pharmacy contact our office and allow 72 hours for refills to be completed.    Today you received the following chemotherapy and/or immunotherapy agents: Doxorubicin, Cytoxan.       To help prevent nausea and vomiting after your treatment, we encourage you to take your nausea medication as directed.  BELOW ARE SYMPTOMS THAT SHOULD BE REPORTED IMMEDIATELY: *FEVER GREATER THAN 100.4 F (38 C) OR HIGHER *CHILLS OR SWEATING *NAUSEA AND VOMITING THAT IS NOT CONTROLLED WITH YOUR NAUSEA MEDICATION *UNUSUAL SHORTNESS OF BREATH *UNUSUAL BRUISING OR BLEEDING *URINARY PROBLEMS (pain or burning when urinating, or frequent urination) *BOWEL PROBLEMS (unusual diarrhea, constipation, pain near the anus) TENDERNESS IN MOUTH AND THROAT WITH OR WITHOUT PRESENCE OF ULCERS (sore throat, sores in mouth, or a toothache) UNUSUAL RASH, SWELLING OR PAIN  UNUSUAL VAGINAL DISCHARGE OR ITCHING   Items with * indicate a potential emergency and should be followed up as soon as possible or go to the Emergency Department if any problems should occur.  Please show the CHEMOTHERAPY ALERT CARD or  IMMUNOTHERAPY ALERT CARD at check-in to the Emergency Department and triage nurse.  Should you have questions after your visit or need to cancel or reschedule your appointment, please contact [facility] - A DE[facility]  Dept: [phone]  and follow the prompts.  Office hours are 8:00 a.m. to 4:30 p.m. Monday - Friday. Please note that voicemails left after 4:00 p.m. may not be returned until the following business day.  We are closed weekends and major holidays. You have access to a nurse at all times for urgent questions. Please call the main number to the clinic Dept: [phone] and follow the prompts.   For any non-urgent questions, you may also contact your provider using MyChart. We now offer e-Visits for anyone 39 and older to request care online for non-urgent symptoms. For details visit [email or web address].   Also download the MyChart app! Go to the app store, search "MyChart", open the app, select [place], and log in with your MyChart username and password.

## 2023-11-26 NOTE — Progress Notes (Signed)
 Patient Care Team: Letta Kocher, PA-C as PCP - General (Family Medicine) Donnelly Angelica, RN as Oncology Nurse Navigator Pershing Proud, RN as Oncology Nurse Navigator Emelia Loron, MD as Consulting Physician (General Surgery) Serena Croissant, MD as Consulting Physician (Hematology and Oncology) Dorothy Puffer, MD as Consulting Physician (Radiation Oncology)  DIAGNOSIS:  Encounter Diagnosis  Name Primary?   Malignant neoplasm of lower-inner quadrant of left breast in female, estrogen receptor positive (HCC) Yes    SUMMARY OF ONCOLOGIC HISTORY: Oncology History  Malignant neoplasm of lower-inner quadrant of left breast in female, estrogen receptor positive (HCC)  08/20/2023 Initial Diagnosis   Screening mammogram detected left breast distortion at 7 o'clock position measuring 1.2 cm.  Extra lymph node biopsy benign, biopsy of the breast: Grade 2 IDC ER 90%, PR 90%, HER2 negative by FISH, Ki-67 5%   08/26/2023 Cancer Staging   Staging form: Breast, AJCC 8th Edition - Clinical stage from 08/26/2023: Stage IA (cT1c, cN0(f), cM0, G2, ER+, PR+, HER2-) - Signed by Serena Croissant, MD on 08/28/2023 Stage prefix: Initial diagnosis Method of lymph node assessment: Core biopsy Histologic grading system: 3 grade system   09/05/2023 Genetic Testing   Negative [facility] CancerNext-Expanded +RNAinsight Panel.  Report date is 09/05/2023.   The CancerNext-Expanded gene panel offered by [facility] and includes sequencing, rearrangement, and RNA analysis for the following 76 genes: AIP, ALK, APC, ATM, AXIN2, BAP1, BARD1, BMPR1A, BRCA1, BRCA2, BRIP1, CDC73, CDH1, CDK4, CDKN1B, CDKN2A, CEBPA, CHEK2, CTNNA1, DDX41, DICER1, ETV6, FH, FLCN, GATA2, LZTR1, MAX, MBD4, MEN1, MET, MLH1, MSH2, MSH3, MSH6, MUTYH, NF1, NF2, NTHL1, PALB2, PHOX2B, PMS2, POT1, PRKAR1A, PTCH1, PTEN, RAD51C, RAD51D, RB1, RET, RUNX1, SDHA, SDHAF2, SDHB, SDHC, SDHD, SMAD4, SMARCA4, SMARCB1, SMARCE1, STK11, SUFU, TMEM127, TP53, TSC1,  TSC2, VHL, and WT1 (sequencing and deletion/duplication); EGFR, HOXB13, KIT, MITF, PDGFRA, POLD1, and POLE (sequencing only); EPCAM and GREM1 (deletion/duplication only).    10/15/2023 -  Chemotherapy   Patient is on Treatment Plan : BREAST DOSE DENSE AC q14d / PACLitaxel q7d       CHIEF COMPLIANT: Cycle 4 dose dense Adriamycin and Cytoxan  HISTORY OF PRESENT ILLNESS:  History of Present Illness Alicia Roach, a patient undergoing chemotherapy, reports experiencing fatigue, nausea, and constipation following her last treatment. The fatigue was more pronounced than previous treatments, taking longer to recover from. Nausea was managed with medication on one occasion, and constipation resolved itself after a few days. Despite these side effects, the patient's appetite remains good, although some foods taste different. The patient is due to start a new chemotherapy drug, Taxol, and has been informed that she will not need to take Neulasta with this treatment.     ALLERGIES:  is allergic to methimazole.  MEDICATIONS:  Current Outpatient Medications  Medication Sig Dispense Refill   albuterol (VENTOLIN HFA) 108 (90 Base) MCG/ACT inhaler Inhale 2 puffs into the lungs every 6 (six) hours as needed for wheezing or shortness of breath. 8 g 1   benzonatate (TESSALON) 200 MG capsule Take 1 capsule (200 mg total) by mouth 3 (three) times daily as needed for cough. 20 capsule 0   Collagen Hydrolysate POWD 2 Scoops by Does not apply route.     dexamethasone (DECADRON) 4 MG tablet Take 1 tablet by mouth daily for 2 days. Start the day after doxorubicin/cyclophosphamide chemotherapy. Take with food. 8 tablet 0   levothyroxine (SYNTHROID) 125 MCG tablet Take 125 mcg by mouth daily before breakfast.     lidocaine-prilocaine (EMLA) cream Apply to  affected area once 30 g 3   medium chain triglycerides (MCT OIL) oil Take by mouth 3 (three) times daily.     ondansetron (ZOFRAN) 8 MG tablet Take 1 tab (8 mg) by mouth  every 8 hrs as needed for nausea/vomiting. Start third day after doxorubicin/cyclophosphamide chemotherapy. 30 tablet 1   prochlorperazine (COMPAZINE) 10 MG tablet Take 1 tablet (10 mg total) by mouth every 6 (six) hours as needed for nausea or vomiting. 30 tablet 1   venlafaxine XR (EFFEXOR-XR) 150 MG 24 hr capsule Take 150 mg by mouth daily.     venlafaxine XR (EFFEXOR-XR) 75 MG 24 hr capsule Take 75 mg by mouth daily.     VRAYLAR 1.5 MG capsule Take 1.5 mg by mouth every other day.     No current facility-administered medications for this visit.    PHYSICAL EXAMINATION: ECOG PERFORMANCE STATUS: 1 - Symptomatic but completely ambulatory  Vitals:   11/26/23 1202  BP: 114/77  Pulse: (!) 105  Resp: 18  Temp: 97.9 F (36.6 C)  SpO2: 98%   Filed Weights   11/26/23 1202  Weight: 162 lb 6.4 oz (73.7 kg)      LABORATORY DATA:  I have reviewed the data as listed    Latest Ref Rng & Units 11/12/2023   11:09 AM 10/29/2023   11:34 AM 10/22/2023    1:58 PM  CMP  Glucose 70 - 99 mg/dL 89  409  811   BUN 6 - 20 mg/dL 16  13  13    Creatinine 0.44 - 1.00 mg/dL 9.14  7.82  9.56   Sodium 135 - 145 mmol/L 139  138  137   Potassium 3.5 - 5.1 mmol/L 3.9  4.2  4.1   Chloride 98 - 111 mmol/L 105  104  105   CO2 22 - 32 mmol/L 29  28  25    Calcium 8.9 - 10.3 mg/dL 9.4  9.1  9.3   Total Protein 6.5 - 8.1 g/dL 7.1  7.0  6.9   Total Bilirubin 0.0 - 1.2 mg/dL 0.3  0.3  0.4   Alkaline Phos 38 - 126 U/L 126  120  103   AST 15 - 41 U/L 16  20  14    ALT 0 - 44 U/L 16  20  22      Lab Results  Component Value Date   WBC 18.6 (H) 11/26/2023   HGB 11.9 (L) 11/26/2023   HCT 34.2 (L) 11/26/2023   MCV 88.6 11/26/2023   PLT 148 (L) 11/26/2023   NEUTROABS 13.8 (H) 11/26/2023    ASSESSMENT & PLAN:  Malignant neoplasm of lower-inner quadrant of left breast in female, estrogen receptor positive (HCC) 08/20/2023:Screening mammogram detected left breast distortion at 7 o'clock position measuring 1.2 cm.   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 Adjuvant radiation therapy Adjuvant antiestrogen therapy with ovarian function suppression and anastrozole, along with Verzenio --------------------------------------------------------------------------------------------------------------------- Current treatment: Cycle 4 dose dense Adriamycin and Cytoxan Chemo toxicities: Severe fatigue for 2 to 3 days Mild constipation Mild nausea Mild anemia: Monitoring closely   Return to clinic in 2  weeks for cycle 1 Taxol   No orders of the defined types were placed in this encounter.  The patient has a good understanding of the overall plan. she agrees with it. she will call with any problems that may develop before the next visit here. Total time spent: 30 mins including face to face time and time spent for planning, charting and co-ordination of care   Tamsen Meek, MD 11/26/23

## 2023-11-26 NOTE — Assessment & Plan Note (Signed)
 08/20/2023:Screening mammogram detected left breast distortion at 7 o'clock position measuring 1.2 cm.  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 Adjuvant radiation therapy Adjuvant antiestrogen therapy with ovarian function suppression and anastrozole, along with Verzenio --------------------------------------------------------------------------------------------------------------------- Current treatment: Cycle 4 dose dense Adriamycin and Cytoxan Chemo toxicities: Severe fatigue for 2 to 3 days Upper respiratory infection: Sent prescription for [employer].   Denied any nausea or vomiting.  Mild constipation but relieved easily.   Return to clinic in 2 weeks for cycle 1 Taxol

## 2023-11-28 ENCOUNTER — Inpatient Hospital Stay: Payer: No Typology Code available for payment source

## 2023-11-28 VITALS — BP 112/76 | HR 101 | Temp 98.4°F | Resp 18

## 2023-11-28 DIAGNOSIS — Z5111 Encounter for antineoplastic chemotherapy: Secondary | ICD-10-CM | POA: Diagnosis not present

## 2023-11-28 DIAGNOSIS — Z170 Estrogen receptor positive status [ER+]: Secondary | ICD-10-CM

## 2023-11-28 MED ORDER — PEGFILGRASTIM-JMDB 6 MG/0.6ML ~~LOC~~ SOSY
6.0000 mg | PREFILLED_SYRINGE | Freq: Once | SUBCUTANEOUS | Status: AC
  Administered 2023-11-28: 6 mg via SUBCUTANEOUS
  Filled 2023-11-28: qty 0.6

## 2023-12-10 ENCOUNTER — Ambulatory Visit: Payer: No Typology Code available for payment source

## 2023-12-10 ENCOUNTER — Inpatient Hospital Stay (HOSPITAL_BASED_OUTPATIENT_CLINIC_OR_DEPARTMENT_OTHER): Payer: No Typology Code available for payment source | Admitting: Hematology and Oncology

## 2023-12-10 ENCOUNTER — Inpatient Hospital Stay: Payer: No Typology Code available for payment source

## 2023-12-10 VITALS — BP 128/75 | HR 95 | Temp 98.0°F | Resp 18 | Ht 66.0 in | Wt 163.8 lb

## 2023-12-10 VITALS — BP 123/86 | HR 88 | Temp 98.2°F | Resp 18

## 2023-12-10 DIAGNOSIS — C50312 Malignant neoplasm of lower-inner quadrant of left female breast: Secondary | ICD-10-CM

## 2023-12-10 DIAGNOSIS — Z95828 Presence of other vascular implants and grafts: Secondary | ICD-10-CM

## 2023-12-10 DIAGNOSIS — Z170 Estrogen receptor positive status [ER+]: Secondary | ICD-10-CM

## 2023-12-10 DIAGNOSIS — Z5111 Encounter for antineoplastic chemotherapy: Secondary | ICD-10-CM | POA: Diagnosis not present

## 2023-12-10 LAB — CMP (CANCER CENTER ONLY)
ALT: 11 U/L (ref 0–44)
AST: 13 U/L — ABNORMAL LOW (ref 15–41)
Albumin: 4.1 g/dL (ref 3.5–5.0)
Alkaline Phosphatase: 141 U/L — ABNORMAL HIGH (ref 38–126)
Anion gap: 4 — ABNORMAL LOW (ref 5–15)
BUN: 11 mg/dL (ref 6–20)
CO2: 29 mmol/L (ref 22–32)
Calcium: 9.4 mg/dL (ref 8.9–10.3)
Chloride: 107 mmol/L (ref 98–111)
Creatinine: 0.69 mg/dL (ref 0.44–1.00)
GFR, Estimated: >60 mL/min (ref >60)
Glucose, Bld: 124 mg/dL — ABNORMAL HIGH (ref 70–99)
Potassium: 4 mmol/L (ref 3.5–5.1)
Sodium: 140 mmol/L (ref 135–145)
Total Bilirubin: 0.3 mg/dL (ref 0.0–1.2)
Total Protein: 6.8 g/dL (ref 6.5–8.1)

## 2023-12-10 LAB — CBC WITH DIFFERENTIAL (CANCER CENTER ONLY)
Abs Immature Granulocytes: 1.53 10*3/uL — ABNORMAL HIGH (ref 0.00–0.07)
Basophils Absolute: 0.3 10*3/uL — ABNORMAL HIGH (ref 0.0–0.1)
Basophils Relative: 2 %
Eosinophils Absolute: 0.1 10*3/uL (ref 0.0–0.5)
Eosinophils Relative: 1 %
HCT: 29.9 % — ABNORMAL LOW (ref 36.0–46.0)
Hemoglobin: 10.2 g/dL — ABNORMAL LOW (ref 12.0–15.0)
Immature Granulocytes: 10 %
Lymphocytes Relative: 9 %
Lymphs Abs: 1.4 10*3/uL (ref 0.7–4.0)
MCH: 30.5 pg (ref 26.0–34.0)
MCHC: 34.1 g/dL (ref 30.0–36.0)
MCV: 89.5 fL (ref 80.0–100.0)
Monocytes Absolute: 0.9 10*3/uL (ref 0.1–1.0)
Monocytes Relative: 6 %
Neutro Abs: 11.3 10*3/uL — ABNORMAL HIGH (ref 1.7–7.7)
Neutrophils Relative %: 72 %
Platelet Count: 159 10*3/uL (ref 150–400)
RBC: 3.34 MIL/uL — ABNORMAL LOW (ref 3.87–5.11)
RDW: 15.3 % (ref 11.5–15.5)
WBC Count: 15.5 10*3/uL — ABNORMAL HIGH (ref 4.0–10.5)
nRBC: 0.4 % — ABNORMAL HIGH (ref 0.0–0.2)

## 2023-12-10 LAB — PREGNANCY, URINE: Preg Test, Ur: NEGATIVE

## 2023-12-10 MED ORDER — DEXAMETHASONE SODIUM PHOSPHATE 10 MG/ML IJ SOLN
10.0000 mg | Freq: Once | INTRAMUSCULAR | Status: AC
  Administered 2023-12-10: 10 mg via INTRAVENOUS
  Filled 2023-12-10: qty 1

## 2023-12-10 MED ORDER — SODIUM CHLORIDE 0.9% FLUSH
10.0000 mL | INTRAVENOUS | Status: DC | PRN
  Administered 2023-12-10: 10 mL

## 2023-12-10 MED ORDER — SODIUM CHLORIDE 0.9 % IV SOLN
80.0000 mg/m2 | Freq: Once | INTRAVENOUS | Status: AC
  Administered 2023-12-10: 144 mg via INTRAVENOUS
  Filled 2023-12-10: qty 24

## 2023-12-10 MED ORDER — SODIUM CHLORIDE 0.9% FLUSH
10.0000 mL | Freq: Once | INTRAVENOUS | Status: AC
  Administered 2023-12-10: 10 mL

## 2023-12-10 MED ORDER — DIPHENHYDRAMINE HCL 50 MG/ML IJ SOLN
25.0000 mg | Freq: Once | INTRAMUSCULAR | Status: AC
  Administered 2023-12-10: 25 mg via INTRAVENOUS
  Filled 2023-12-10: qty 1

## 2023-12-10 MED ORDER — FAMOTIDINE IN NACL 20-0.9 MG/50ML-% IV SOLN
20.0000 mg | Freq: Once | INTRAVENOUS | Status: AC
Start: 2023-12-10 — End: 2023-12-10
  Administered 2023-12-10: 20 mg via INTRAVENOUS
  Filled 2023-12-10: qty 50

## 2023-12-10 MED ORDER — HEPARIN SOD (PORK) LOCK FLUSH 100 UNIT/ML IV SOLN
500.0000 [IU] | Freq: Once | INTRAVENOUS | Status: AC | PRN
Start: 2023-12-10 — End: 2023-12-10
  Administered 2023-12-10: 500 [IU]

## 2023-12-10 MED ORDER — SODIUM CHLORIDE 0.9 % IV SOLN: INTRAVENOUS | Status: DC

## 2023-12-10 NOTE — Progress Notes (Signed)
 Patient Care Team: [name], PA-C as PCP - General (Family Medicine) Alane Hsu, RN as Oncology Nurse Navigator Auther Bo, RN as Oncology Nurse Navigator Enid Harry, MD as Consulting Physician (General Surgery) Cameron Cea, MD as Consulting Physician (Hematology and Oncology) Johna Myers, MD as Consulting Physician (Radiation Oncology)  DIAGNOSIS:  Encounter Diagnosis  Name Primary?   Malignant neoplasm of lower-inner quadrant of left breast in female, estrogen receptor positive (HCC) Yes    SUMMARY OF ONCOLOGIC HISTORY: Oncology History  Malignant neoplasm of lower-inner quadrant of left breast in female, estrogen receptor positive (HCC)  08/20/2023 Initial Diagnosis   Screening mammogram detected left breast distortion at 7 o'clock position measuring 1.2 cm.  Extra lymph node biopsy benign, biopsy of the breast: Grade 2 IDC ER 90%, PR 90%, HER2 negative by FISH, Ki-67 5%   08/26/2023 Cancer Staging   Staging form: Breast, AJCC 8th Edition - Clinical stage from 08/26/2023: Stage IA (cT1c, cN0(f), cM0, G2, ER+, PR+, HER2-) - Signed by Cameron Cea, MD on 08/28/2023 Stage prefix: Initial diagnosis Method of lymph node assessment: Core biopsy Histologic grading system: 3 grade system   09/05/2023 Genetic Testing   Negative [facility] CancerNext-Expanded +RNAinsight Panel.  Report date is 09/05/2023.   The CancerNext-Expanded gene panel offered by [facility] and includes sequencing, rearrangement, and RNA analysis for the following 76 genes: AIP, ALK, APC, ATM, AXIN2, BAP1, BARD1, BMPR1A, BRCA1, BRCA2, BRIP1, CDC73, CDH1, CDK4, CDKN1B, CDKN2A, CEBPA, CHEK2, CTNNA1, DDX41, DICER1, ETV6, FH, FLCN, GATA2, LZTR1, MAX, MBD4, MEN1, MET, MLH1, MSH2, MSH3, MSH6, MUTYH, NF1, NF2, NTHL1, PALB2, PHOX2B, PMS2, POT1, PRKAR1A, PTCH1, PTEN, RAD51C, RAD51D, RB1, RET, RUNX1, SDHA, SDHAF2, SDHB, SDHC, SDHD, SMAD4, SMARCA4, SMARCB1, SMARCE1, STK11, SUFU, TMEM127, TP53, TSC1,  TSC2, VHL, and WT1 (sequencing and deletion/duplication); EGFR, HOXB13, KIT, MITF, PDGFRA, POLD1, and POLE (sequencing only); EPCAM and GREM1 (deletion/duplication only).    10/15/2023 -  Chemotherapy   Patient is on Treatment Plan : BREAST DOSE DENSE AC q14d / PACLitaxel  q7d       CHIEF COMPLIANT: Cycle 1 Taxol   HISTORY OF PRESENT ILLNESS:   History of Present Illness The patient, with a history of cancer, has recently completed a course of chemotherapy and is about to start a new treatment regimen with Taxol . She expresses hope that the new treatment will be less taxing on her body. The patient also mentions that steroids, which are part of the premedication regimen, tend to keep her awake all day after treatment.     ALLERGIES:  is allergic to methimazole.  MEDICATIONS:  Current Outpatient Medications  Medication Sig Dispense Refill   Collagen Hydrolysate POWD 2 Scoops by Does not apply route.     dexamethasone  (DECADRON ) 4 MG tablet Take 1 tablet by mouth daily for 2 days. Start the day after doxorubicin /cyclophosphamide  chemotherapy. Take with food. 8 tablet 0   levothyroxine  (SYNTHROID ) 125 MCG tablet Take 125 mcg by mouth daily before breakfast.     lidocaine -prilocaine  (EMLA ) cream Apply to affected area once 30 g 3   medium chain triglycerides (MCT OIL) oil Take by mouth 3 (three) times daily.     venlafaxine XR (EFFEXOR-XR) 150 MG 24 hr capsule Take 150 mg by mouth daily.     venlafaxine XR (EFFEXOR-XR) 75 MG 24 hr capsule Take 75 mg by mouth daily.     VRAYLAR 1.5 MG capsule Take 1.5 mg by mouth every other day.     ondansetron  (ZOFRAN ) 8 MG tablet Take  1 tab (8 mg) by mouth every 8 hrs as needed for nausea/vomiting. Start third day after doxorubicin /cyclophosphamide  chemotherapy. (Patient not taking: Reported on 12/10/2023) 30 tablet 1   prochlorperazine  (COMPAZINE ) 10 MG tablet Take 1 tablet (10 mg total) by mouth every 6 (six) hours as needed for nausea or vomiting. (Patient  not taking: Reported on 12/10/2023) 30 tablet 1   No current facility-administered medications for this visit.   Facility-Administered Medications Ordered in Other Visits  Medication Dose Route Frequency Provider Last Rate Last Admin   0.9 %  sodium chloride  infusion   Intravenous Continuous Cameron Cea, MD 10 mL/hr at 12/10/23 1115 New Bag at 12/10/23 1115   heparin  lock flush 100 unit/mL  500 Units Intracatheter Once PRN Cameron Cea, MD       PACLitaxel  (TAXOL ) 144 mg in sodium chloride  0.9 % 250 mL chemo infusion (</= 80mg /m2)  80 mg/m2 (Treatment Plan Recorded) Intravenous Once Cameron Cea, MD       sodium chloride  flush (NS) 0.9 % injection 10 mL  10 mL Intracatheter PRN Cameron Cea, MD        PHYSICAL EXAMINATION: ECOG PERFORMANCE STATUS: 1 - Symptomatic but completely ambulatory  Vitals:   12/10/23 1034  BP: 128/75  Pulse: 95  Resp: 18  Temp: 98 F (36.7 C)  SpO2: 98%   Filed Weights   12/10/23 1034  Weight: 163 lb 12.8 oz (74.3 kg)      LABORATORY DATA:  I have reviewed the data as listed    Latest Ref Rng & Units 12/10/2023   10:12 AM 11/26/2023   11:30 AM 11/12/2023   11:09 AM  CMP  Glucose 70 - 99 mg/dL 161  90  89   BUN 6 - 20 mg/dL 11  10  16    Creatinine 0.44 - 1.00 mg/dL 0.96  0.45  4.09   Sodium 135 - 145 mmol/L 140  137  139   Potassium 3.5 - 5.1 mmol/L 4.0  4.2  3.9   Chloride 98 - 111 mmol/L 107  106  105   CO2 22 - 32 mmol/L 29  26  29    Calcium 8.9 - 10.3 mg/dL 9.4  9.5  9.4   Total Protein 6.5 - 8.1 g/dL 6.8  7.4  7.1   Total Bilirubin 0.0 - 1.2 mg/dL 0.3  0.4  0.3   Alkaline Phos 38 - 126 U/L 141  167  126   AST 15 - 41 U/L 13  15  16    ALT 0 - 44 U/L 11  15  16      Lab Results  Component Value Date   WBC 15.5 (H) 12/10/2023   HGB 10.2 (L) 12/10/2023   HCT 29.9 (L) 12/10/2023   MCV 89.5 12/10/2023   PLT 159 12/10/2023   NEUTROABS 11.3 (H) 12/10/2023    ASSESSMENT & PLAN:  Malignant neoplasm of lower-inner quadrant of left  breast in female, estrogen receptor positive (HCC) 08/20/2023:Screening mammogram detected left breast distortion at 7 o'clock position measuring 1.2 cm.  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 Adjuvant radiation therapy Adjuvant antiestrogen  therapy with ovarian function suppression and anastrozole, along with Verzenio --------------------------------------------------------------------------------------------------------------------- Current treatment: Completed 4 cycles of dose dense Adriamycin  and Cytoxan , today cycle 1 Taxol  Chemo toxicities: Severe fatigue for 2 to 3 days Mild constipation Mild nausea Mild anemia: Monitoring closely today's hemoglobin is 10.2   Return to clinic in 1 week for cycle 2 Taxol  for toxicity check      No orders of the defined types were placed in this encounter.  The patient has a good understanding of the overall plan. she agrees with it. she will call with any problems that may develop before the next visit here. Total time spent: 30 mins including face to face time and time spent for planning, charting and co-ordination of care   Margert Sheerer, MD 12/10/23

## 2023-12-10 NOTE — Patient Instructions (Signed)

## 2023-12-10 NOTE — Assessment & Plan Note (Signed)
 08/20/2023:Screening mammogram detected left breast distortion at 7 o'clock position measuring 1.2 cm.  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 Adjuvant radiation therapy Adjuvant antiestrogen therapy with ovarian function suppression and anastrozole, along with Verzenio --------------------------------------------------------------------------------------------------------------------- Current treatment: Completed 4 cycles of dose dense Adriamycin  and Cytoxan , today cycle 1 Taxol  Chemo toxicities: Severe fatigue for 2 to 3 days Mild constipation Mild nausea Mild anemia: Monitoring closely   Return to clinic in 1 week for cycle 2 Taxol  for toxicity check

## 2023-12-11 ENCOUNTER — Telehealth: Payer: Self-pay

## 2023-12-11 NOTE — Telephone Encounter (Signed)
-----   Message from Nurse Cortney P sent at 12/10/2023  4:14 PM EDT ----- Regarding: Dr Lee Public, first tiem Taxol  Dr Lee Public patient, first time Taxol . Patient tolerated treatment well. No incidents

## 2023-12-11 NOTE — Telephone Encounter (Signed)
 Alicia Roach states that she is doing fine. She is eating, drinking, and urinating well. She knows to call the office at [phone] if  she has any questions or concerns.

## 2023-12-17 ENCOUNTER — Ambulatory Visit: Payer: No Typology Code available for payment source | Attending: General Surgery | Admitting: Rehabilitation

## 2023-12-17 ENCOUNTER — Encounter: Payer: Self-pay | Admitting: Rehabilitation

## 2023-12-17 ENCOUNTER — Inpatient Hospital Stay (HOSPITAL_BASED_OUTPATIENT_CLINIC_OR_DEPARTMENT_OTHER): Payer: No Typology Code available for payment source | Admitting: Hematology and Oncology

## 2023-12-17 ENCOUNTER — Inpatient Hospital Stay: Payer: No Typology Code available for payment source

## 2023-12-17 VITALS — BP 100/66 | HR 114 | Temp 98.6°F | Resp 18 | Ht 66.0 in | Wt 165.1 lb

## 2023-12-17 VITALS — HR 100

## 2023-12-17 DIAGNOSIS — C50312 Malignant neoplasm of lower-inner quadrant of left female breast: Secondary | ICD-10-CM

## 2023-12-17 DIAGNOSIS — Z170 Estrogen receptor positive status [ER+]: Secondary | ICD-10-CM

## 2023-12-17 DIAGNOSIS — C50212 Malignant neoplasm of upper-inner quadrant of left female breast: Secondary | ICD-10-CM | POA: Insufficient documentation

## 2023-12-17 DIAGNOSIS — Z95828 Presence of other vascular implants and grafts: Secondary | ICD-10-CM

## 2023-12-17 DIAGNOSIS — Z5111 Encounter for antineoplastic chemotherapy: Secondary | ICD-10-CM | POA: Diagnosis not present

## 2023-12-17 DIAGNOSIS — Z483 Aftercare following surgery for neoplasm: Secondary | ICD-10-CM | POA: Insufficient documentation

## 2023-12-17 LAB — CBC WITH DIFFERENTIAL (CANCER CENTER ONLY)
Abs Immature Granulocytes: 0.07 K/uL (ref 0.00–0.07)
Basophils Absolute: 0.1 K/uL (ref 0.0–0.1)
Basophils Relative: 2 %
Eosinophils Absolute: 0.1 K/uL (ref 0.0–0.5)
Eosinophils Relative: 1 %
HCT: 28.7 % — ABNORMAL LOW (ref 36.0–46.0)
Hemoglobin: 9.9 g/dL — ABNORMAL LOW (ref 12.0–15.0)
Immature Granulocytes: 1 %
Lymphocytes Relative: 15 %
Lymphs Abs: 1.1 K/uL (ref 0.7–4.0)
MCH: 30.8 pg (ref 26.0–34.0)
MCHC: 34.5 g/dL (ref 30.0–36.0)
MCV: 89.4 fL (ref 80.0–100.0)
Monocytes Absolute: 0.7 K/uL (ref 0.1–1.0)
Monocytes Relative: 10 %
Neutro Abs: 4.9 K/uL (ref 1.7–7.7)
Neutrophils Relative %: 71 %
Platelet Count: 399 K/uL (ref 150–400)
RBC: 3.21 MIL/uL — ABNORMAL LOW (ref 3.87–5.11)
RDW: 15.5 % (ref 11.5–15.5)
WBC Count: 7 K/uL (ref 4.0–10.5)
nRBC: 0 % (ref 0.0–0.2)

## 2023-12-17 LAB — CMP (CANCER CENTER ONLY)
ALT: 34 U/L (ref 0–44)
AST: 28 U/L (ref 15–41)
Albumin: 4 g/dL (ref 3.5–5.0)
Alkaline Phosphatase: 98 U/L (ref 38–126)
Anion gap: 6 (ref 5–15)
BUN: 12 mg/dL (ref 6–20)
CO2: 28 mmol/L (ref 22–32)
Calcium: 9.1 mg/dL (ref 8.9–10.3)
Chloride: 105 mmol/L (ref 98–111)
Creatinine: 0.66 mg/dL (ref 0.44–1.00)
GFR, Estimated: >60 mL/min (ref >60)
Glucose, Bld: 114 mg/dL — ABNORMAL HIGH (ref 70–99)
Potassium: 3.9 mmol/L (ref 3.5–5.1)
Sodium: 139 mmol/L (ref 135–145)
Total Bilirubin: 0.3 mg/dL (ref 0.0–1.2)
Total Protein: 6.7 g/dL (ref 6.5–8.1)

## 2023-12-17 MED ORDER — SODIUM CHLORIDE 0.9 % IV SOLN
80.0000 mg/m2 | Freq: Once | INTRAVENOUS | Status: AC
  Administered 2023-12-17: 144 mg via INTRAVENOUS
  Filled 2023-12-17: qty 24

## 2023-12-17 MED ORDER — FAMOTIDINE IN NACL 20-0.9 MG/50ML-% IV SOLN
20.0000 mg | Freq: Once | INTRAVENOUS | Status: AC
  Administered 2023-12-17: 20 mg via INTRAVENOUS
  Filled 2023-12-17: qty 50

## 2023-12-17 MED ORDER — SODIUM CHLORIDE 0.9% FLUSH
10.0000 mL | Freq: Once | INTRAVENOUS | Status: AC
  Administered 2023-12-17: 10 mL

## 2023-12-17 MED ORDER — SODIUM CHLORIDE 0.9 % IV SOLN: INTRAVENOUS | Status: DC

## 2023-12-17 MED ORDER — DIPHENHYDRAMINE HCL 50 MG/ML IJ SOLN
25.0000 mg | Freq: Once | INTRAMUSCULAR | Status: AC
  Administered 2023-12-17: 25 mg via INTRAVENOUS
  Filled 2023-12-17: qty 1

## 2023-12-17 MED ORDER — SODIUM CHLORIDE 0.9% FLUSH
10.0000 mL | INTRAVENOUS | Status: DC | PRN
  Administered 2023-12-17: 10 mL

## 2023-12-17 MED ORDER — DEXAMETHASONE SODIUM PHOSPHATE 10 MG/ML IJ SOLN
10.0000 mg | Freq: Once | INTRAMUSCULAR | Status: AC
  Administered 2023-12-17: 10 mg via INTRAVENOUS
  Filled 2023-12-17: qty 1

## 2023-12-17 MED ORDER — HEPARIN SOD (PORK) LOCK FLUSH 100 UNIT/ML IV SOLN
500.0000 [IU] | Freq: Once | INTRAVENOUS | Status: AC | PRN
Start: 2023-12-17 — End: 2023-12-17
  Administered 2023-12-17: 500 [IU]

## 2023-12-17 NOTE — Assessment & Plan Note (Signed)
 08/20/2023:Screening mammogram detected left breast distortion at 7 o'clock position measuring 1.2 cm.  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 Adjuvant radiation therapy Adjuvant antiestrogen therapy with ovarian function suppression and anastrozole, along with Verzenio --------------------------------------------------------------------------------------------------------------------- Current treatment: Completed 4 cycles of dose dense Adriamycin  and Cytoxan , today cycle 2 Taxol  Chemo toxicities: Severe fatigue for 2 to 3 days Mild constipation Mild nausea Mild anemia: Monitoring closely today's hemoglobin is 10.2   Return to clinic weekly for chemotherapy

## 2023-12-17 NOTE — Progress Notes (Signed)
 Patient Care Team: [name], PA-C as PCP - General (Family Medicine) Alane Hsu, RN as Oncology Nurse Navigator Auther Bo, RN as Oncology Nurse Navigator Enid Harry, MD as Consulting Physician (General Surgery) Cameron Cea, MD as Consulting Physician (Hematology and Oncology) Johna Myers, MD as Consulting Physician (Radiation Oncology)  DIAGNOSIS:  Encounter Diagnosis  Name Primary?   Malignant neoplasm of lower-inner quadrant of left breast in female, estrogen receptor positive (HCC) Yes    SUMMARY OF ONCOLOGIC HISTORY: Oncology History  Malignant neoplasm of lower-inner quadrant of left breast in female, estrogen receptor positive (HCC)  08/20/2023 Initial Diagnosis   Screening mammogram detected left breast distortion at 7 o'clock position measuring 1.2 cm.  Extra lymph node biopsy benign, biopsy of the breast: Grade 2 IDC ER 90%, PR 90%, HER2 negative by FISH, Ki-67 5%   08/26/2023 Cancer Staging   Staging form: Breast, AJCC 8th Edition - Clinical stage from 08/26/2023: Stage IA (cT1c, cN0(f), cM0, G2, ER+, PR+, HER2-) - Signed by Cameron Cea, MD on 08/28/2023 Stage prefix: Initial diagnosis Method of lymph node assessment: Core biopsy Histologic grading system: 3 grade system   09/05/2023 Genetic Testing   Negative [facility] CancerNext-Expanded +RNAinsight Panel.  Report date is 09/05/2023.   The CancerNext-Expanded gene panel offered by [facility] and includes sequencing, rearrangement, and RNA analysis for the following 76 genes: AIP, ALK, APC, ATM, AXIN2, BAP1, BARD1, BMPR1A, BRCA1, BRCA2, BRIP1, CDC73, CDH1, CDK4, CDKN1B, CDKN2A, CEBPA, CHEK2, CTNNA1, DDX41, DICER1, ETV6, FH, FLCN, GATA2, LZTR1, MAX, MBD4, MEN1, MET, MLH1, MSH2, MSH3, MSH6, MUTYH, NF1, NF2, NTHL1, PALB2, PHOX2B, PMS2, POT1, PRKAR1A, PTCH1, PTEN, RAD51C, RAD51D, RB1, RET, RUNX1, SDHA, SDHAF2, SDHB, SDHC, SDHD, SMAD4, SMARCA4, SMARCB1, SMARCE1, STK11, SUFU, TMEM127, TP53, TSC1,  TSC2, VHL, and WT1 (sequencing and deletion/duplication); EGFR, HOXB13, KIT, MITF, PDGFRA, POLD1, and POLE (sequencing only); EPCAM and GREM1 (deletion/duplication only).    10/15/2023 -  Chemotherapy   Patient is on Treatment Plan : BREAST DOSE DENSE AC q14d / PACLitaxel  q7d       CHIEF COMPLIANT: Cycle 2 Taxol   HISTORY OF PRESENT ILLNESS: [name] is a 48 year old with above-mentioned history of breast cancer currently on adjuvant chemotherapy with weekly Taxol  treatments.  Today is cycle 2.  She tolerated cycle 1 extremely well without any problems or concerns.  She had fatigue for couple of days but after that she felt fine.  Denied any nausea or vomiting.  Denies any peripheral neuropathy.  She was unable to use ice for prevention of neuropathy.    ALLERGIES:  is allergic to methimazole.  MEDICATIONS:  Current Outpatient Medications  Medication Sig Dispense Refill   Collagen Hydrolysate POWD 2 Scoops by Does not apply route.     dexamethasone  (DECADRON ) 4 MG tablet Take 1 tablet by mouth daily for 2 days. Start the day after doxorubicin /cyclophosphamide  chemotherapy. Take with food. 8 tablet 0   levothyroxine  (SYNTHROID ) 125 MCG tablet Take 125 mcg by mouth daily before breakfast.     lidocaine -prilocaine  (EMLA ) cream Apply to affected area once 30 g 3   medium chain triglycerides (MCT OIL) oil Take by mouth 3 (three) times daily.     venlafaxine XR (EFFEXOR-XR) 150 MG 24 hr capsule Take 150 mg by mouth daily.     venlafaxine XR (EFFEXOR-XR) 75 MG 24 hr capsule Take 75 mg by mouth daily.     VRAYLAR 1.5 MG capsule Take 1.5 mg by mouth every other day.     ondansetron  (ZOFRAN ) 8  MG tablet Take 1 tab (8 mg) by mouth every 8 hrs as needed for nausea/vomiting. Start third day after doxorubicin /cyclophosphamide  chemotherapy. (Patient not taking: Reported on 12/17/2023) 30 tablet 1   prochlorperazine  (COMPAZINE ) 10 MG tablet Take 1 tablet (10 mg total) by mouth every 6 (six) hours as needed for  nausea or vomiting. (Patient not taking: Reported on 12/17/2023) 30 tablet 1   No current facility-administered medications for this visit.    PHYSICAL EXAMINATION: ECOG PERFORMANCE STATUS: 1 - Symptomatic but completely ambulatory  Vitals:   12/17/23 1114  BP: 100/66  Pulse: (!) 114  Resp: 18  Temp: 98.6 F (37 C)  SpO2: 99%   Filed Weights   12/17/23 1114  Weight: 165 lb 1.6 oz (74.9 kg)    Physical Exam   (exam performed in the presence of a chaperone)  LABORATORY DATA:  I have reviewed the data as listed    Latest Ref Rng & Units 12/10/2023   10:12 AM 11/26/2023   11:30 AM 11/12/2023   11:09 AM  CMP  Glucose 70 - 99 mg/dL 161  90  89   BUN 6 - 20 mg/dL 11  10  16    Creatinine 0.44 - 1.00 mg/dL 0.96  0.45  4.09   Sodium 135 - 145 mmol/L 140  137  139   Potassium 3.5 - 5.1 mmol/L 4.0  4.2  3.9   Chloride 98 - 111 mmol/L 107  106  105   CO2 22 - 32 mmol/L 29  26  29    Calcium 8.9 - 10.3 mg/dL 9.4  9.5  9.4   Total Protein 6.5 - 8.1 g/dL 6.8  7.4  7.1   Total Bilirubin 0.0 - 1.2 mg/dL 0.3  0.4  0.3   Alkaline Phos 38 - 126 U/L 141  167  126   AST 15 - 41 U/L 13  15  16    ALT 0 - 44 U/L 11  15  16      Lab Results  Component Value Date   WBC 7.0 12/17/2023   HGB 9.9 (L) 12/17/2023   HCT 28.7 (L) 12/17/2023   MCV 89.4 12/17/2023   PLT 399 12/17/2023   NEUTROABS 4.9 12/17/2023    ASSESSMENT & PLAN:  Malignant neoplasm of lower-inner quadrant of left breast in female, estrogen receptor positive (HCC) 08/20/2023:Screening mammogram detected left breast distortion at 7 o'clock position measuring 1.2 cm.  Extra lymph node biopsy benign, biopsy of the breast: Grade 2 IDC ER 90%, PR 90%, HER2 negative by FISH, Ki-67 5%  09/17/2023: Left lumpectomy: Grade 1 IDC 2 cm with intermediate grade DCIS, margins negative, LVI identified, ER 90%, PR 90%, HER2 negative by FISH, Ki-67 5%, 1/5 lymph nodes positive   Treatment plan: Adjuvant chemotherapy with dose dense Adriamycin   and Cytoxan  x 4 followed by Taxol  weekly x 12. Adjuvant radiation therapy Adjuvant antiestrogen therapy with ovarian function suppression and anastrozole, along with Verzenio --------------------------------------------------------------------------------------------------------------------- Current treatment: Completed 4 cycles of dose dense Adriamycin  and Cytoxan , today cycle 2 Taxol  Chemo toxicities: Mild fatigue Mild anemia: Monitoring closely today's hemoglobin is 9.9 We discussed about using compression stockings and compression gloves to prevent neuropathy because she was unable to tolerate ice. Return to clinic weekly for chemotherapy and every other week for follow-up with me      No orders of the defined types were placed in this encounter.  The patient has a good understanding of the overall plan. she agrees with it. she will call  with any problems that may develop before the next visit here. Total time spent: 30 mins including face to face time and time spent for planning, charting and co-ordination of care   Margert Sheerer, MD 12/17/23

## 2023-12-17 NOTE — Therapy (Signed)
 OUTPATIENT PHYSICAL THERAPY SOZO SCREENING NOTE   Patient Name: Alicia Roach MRN: [id number] DOB:Jun 03, 1976, 48 y.o., female Today's Date: 12/17/2023  PCP: [name], PA-C REFERRING PROVIDER: Enid Harry, MD   PT End of Session - 12/17/23 0945     Visit Number 2   screen   PT Start Time 0940    PT Stop Time 0945    PT Time Calculation (min) 5 min    Activity Tolerance Patient tolerated treatment well    Behavior During Therapy [facility] for tasks assessed/performed             Past Medical History:  Diagnosis Date   Cancer (HCC)    Depression    GAD (generalized anxiety disorder)    Dr. 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   Past Surgical History:  Procedure Laterality Date   BREAST BIOPSY Left 08/20/2023   US  LT BREAST BX W LOC DEV 1ST LESION IMG BX SPEC US  GUIDE 08/20/2023 [facility]   BREAST BIOPSY Left 09/16/2023   US  LT RADIOACTIVE SEED LOC 09/16/2023 [facility]   BREAST BIOPSY Left 09/16/2023   US  LT RADIOACTIVE SEED EA ADD LESION 09/16/2023 [facility]   BREAST LUMPECTOMY WITH RADIOACTIVE SEED AND SENTINEL LYMPH NODE BIOPSY Left 09/17/2023   Procedure: LEFT BREAST SEED GUIDED LUMPECTOMY, LEFT AXILLARY SENTINEL NODE BIOPSY;  Surgeon: Enid Harry, MD;  Location: [facility] OR;  Service: General;  Laterality: Left;  GEN PEC BLOCK   CESAREAN SECTION  10/11/2011   Procedure: CESAREAN SECTION;  Surgeon: Piedad Brewer, MD;  Location: [facility];  Service: Gynecology;  Laterality: N/A;   digital reconstructive surgery (middle finger of L hand)     PORTACATH PLACEMENT Right 10/07/2023   Procedure: INSERTION PORT-A-CATH WITH ULTRASOUND GUIDANCE;  Surgeon: Enid Harry, MD;  Location: [facility];  Service: General;  Laterality: Right;   RADIOACTIVE SEED  GUIDED AXILLARY SENTINEL LYMPH NODE Left 09/17/2023   Procedure: LEFT AXILLARY NODE SEED GUIDED EXCISION;  Surgeon: Enid Harry, MD;  Location: [facility] OR;  Service: General;  Laterality: Left;   TONSILLECTOMY  1999   WISDOM TOOTH EXTRACTION     Patient Active Problem List   Diagnosis Date Noted   Port-A-Cath in place 10/15/2023   Genetic testing 09/06/2023   Malignant neoplasm of lower-inner quadrant of left breast in female, estrogen receptor positive (HCC) 08/26/2023   Acute low back pain 02/28/2014   Pharyngitis, acute 07/02/2012   Viral URI 04/23/2012   SINUSITIS - ACUTE-NOS 06/16/2010   ADVERSE DRUG REACTION 10/18/2009   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 She underwent a left lumpectomy and sentinel node biopsy on 09/17/2023 (1 of 5 nodes positive for carcinoma). It is ER/PR positive and HER2 negative with a Ki67 of 5%   PRECAUTIONS: left UE Lymphedema risk  SUBJECTIVE: doing chemo now  PAIN:  Are you having pain? No  SOZO SCREENING: Patient was assessed today using the SOZO machine to determine the lymphedema index score. This was compared to her  baseline score. It was determined that she is within the recommended range when compared to her baseline and no further action is needed at this time. She will continue SOZO screenings. These are done every 3 months for 2 years post operatively followed by every 6 months for 2 years, and then annually.  Discussed how elevation during chemotherapy is normal.    Encarnacion Harris, PT 12/17/2023, 9:46 AM

## 2023-12-18 ENCOUNTER — Other Ambulatory Visit: Payer: Self-pay

## 2023-12-19 ENCOUNTER — Other Ambulatory Visit: Payer: Self-pay | Admitting: Nurse Practitioner

## 2023-12-24 ENCOUNTER — Inpatient Hospital Stay: Attending: Hematology and Oncology

## 2023-12-24 ENCOUNTER — Inpatient Hospital Stay

## 2023-12-24 ENCOUNTER — Ambulatory Visit: Admitting: Hematology and Oncology

## 2023-12-24 VITALS — BP 114/82 | HR 95 | Temp 98.2°F | Resp 19 | Wt 163.0 lb

## 2023-12-24 DIAGNOSIS — Z800 Family history of malignant neoplasm of digestive organs: Secondary | ICD-10-CM | POA: Insufficient documentation

## 2023-12-24 DIAGNOSIS — D6481 Anemia due to antineoplastic chemotherapy: Secondary | ICD-10-CM | POA: Insufficient documentation

## 2023-12-24 DIAGNOSIS — Z809 Family history of malignant neoplasm, unspecified: Secondary | ICD-10-CM | POA: Diagnosis not present

## 2023-12-24 DIAGNOSIS — Z1721 Progesterone receptor positive status: Secondary | ICD-10-CM | POA: Insufficient documentation

## 2023-12-24 DIAGNOSIS — R530 Neoplastic (malignant) related fatigue: Secondary | ICD-10-CM | POA: Diagnosis not present

## 2023-12-24 DIAGNOSIS — C50312 Malignant neoplasm of lower-inner quadrant of left female breast: Secondary | ICD-10-CM

## 2023-12-24 DIAGNOSIS — Z801 Family history of malignant neoplasm of trachea, bronchus and lung: Secondary | ICD-10-CM | POA: Insufficient documentation

## 2023-12-24 DIAGNOSIS — Z170 Estrogen receptor positive status [ER+]: Secondary | ICD-10-CM

## 2023-12-24 DIAGNOSIS — Z5111 Encounter for antineoplastic chemotherapy: Secondary | ICD-10-CM | POA: Diagnosis present

## 2023-12-24 DIAGNOSIS — Z8042 Family history of malignant neoplasm of prostate: Secondary | ICD-10-CM | POA: Diagnosis not present

## 2023-12-24 DIAGNOSIS — Z1732 Human epidermal growth factor receptor 2 negative status: Secondary | ICD-10-CM | POA: Diagnosis not present

## 2023-12-24 DIAGNOSIS — Z95828 Presence of other vascular implants and grafts: Secondary | ICD-10-CM

## 2023-12-24 DIAGNOSIS — Z87891 Personal history of nicotine dependence: Secondary | ICD-10-CM | POA: Insufficient documentation

## 2023-12-24 LAB — CMP (CANCER CENTER ONLY)
ALT: 39 U/L (ref 0–44)
AST: 24 U/L (ref 15–41)
Albumin: 4.3 g/dL (ref 3.5–5.0)
Alkaline Phosphatase: 100 U/L (ref 38–126)
Anion gap: 5 (ref 5–15)
BUN: 13 mg/dL (ref 6–20)
CO2: 28 mmol/L (ref 22–32)
Calcium: 9.8 mg/dL (ref 8.9–10.3)
Chloride: 106 mmol/L (ref 98–111)
Creatinine: 0.74 mg/dL (ref 0.44–1.00)
GFR, Estimated: >60 mL/min (ref >60)
Glucose, Bld: 89 mg/dL (ref 70–99)
Potassium: 4.1 mmol/L (ref 3.5–5.1)
Sodium: 139 mmol/L (ref 135–145)
Total Bilirubin: 0.4 mg/dL (ref 0.0–1.2)
Total Protein: 7.3 g/dL (ref 6.5–8.1)

## 2023-12-24 LAB — CBC WITH DIFFERENTIAL (CANCER CENTER ONLY)
Abs Immature Granulocytes: 0.05 10*3/uL (ref 0.00–0.07)
Basophils Absolute: 0.1 10*3/uL (ref 0.0–0.1)
Basophils Relative: 2 %
Eosinophils Absolute: 0.2 10*3/uL (ref 0.0–0.5)
Eosinophils Relative: 2 %
HCT: 31.4 % — ABNORMAL LOW (ref 36.0–46.0)
Hemoglobin: 10.8 g/dL — ABNORMAL LOW (ref 12.0–15.0)
Immature Granulocytes: 1 %
Lymphocytes Relative: 16 %
Lymphs Abs: 1.1 10*3/uL (ref 0.7–4.0)
MCH: 31.1 pg (ref 26.0–34.0)
MCHC: 34.4 g/dL (ref 30.0–36.0)
MCV: 90.5 fL (ref 80.0–100.0)
Monocytes Absolute: 0.7 10*3/uL (ref 0.1–1.0)
Monocytes Relative: 10 %
Neutro Abs: 4.7 10*3/uL (ref 1.7–7.7)
Neutrophils Relative %: 69 %
Platelet Count: 296 10*3/uL (ref 150–400)
RBC: 3.47 MIL/uL — ABNORMAL LOW (ref 3.87–5.11)
RDW: 16.3 % — ABNORMAL HIGH (ref 11.5–15.5)
WBC Count: 6.9 10*3/uL (ref 4.0–10.5)
nRBC: 0 % (ref 0.0–0.2)

## 2023-12-24 MED ORDER — FAMOTIDINE IN NACL 20-0.9 MG/50ML-% IV SOLN
20.0000 mg | Freq: Once | INTRAVENOUS | Status: AC
  Administered 2023-12-24: 20 mg via INTRAVENOUS
  Filled 2023-12-24: qty 50

## 2023-12-24 MED ORDER — SODIUM CHLORIDE 0.9% FLUSH: 10.0000 mL | INTRAVENOUS | Status: DC | PRN

## 2023-12-24 MED ORDER — DIPHENHYDRAMINE HCL 50 MG/ML IJ SOLN
25.0000 mg | Freq: Once | INTRAMUSCULAR | Status: AC
  Administered 2023-12-24: 25 mg via INTRAVENOUS
  Filled 2023-12-24: qty 1

## 2023-12-24 MED ORDER — HEPARIN SOD (PORK) LOCK FLUSH 100 UNIT/ML IV SOLN: 500.0000 [IU] | Freq: Once | INTRAVENOUS | Status: DC | PRN

## 2023-12-24 MED ORDER — SODIUM CHLORIDE 0.9 % IV SOLN: INTRAVENOUS | Status: DC

## 2023-12-24 MED ORDER — SODIUM CHLORIDE 0.9% FLUSH
10.0000 mL | Freq: Once | INTRAVENOUS | Status: AC
  Administered 2023-12-24: 10 mL

## 2023-12-24 MED ORDER — SODIUM CHLORIDE 0.9 % IV SOLN
80.0000 mg/m2 | Freq: Once | INTRAVENOUS | Status: AC
  Administered 2023-12-24: 144 mg via INTRAVENOUS
  Filled 2023-12-24: qty 24

## 2023-12-24 MED ORDER — DEXAMETHASONE SODIUM PHOSPHATE 10 MG/ML IJ SOLN
10.0000 mg | Freq: Once | INTRAMUSCULAR | Status: AC
  Administered 2023-12-24: 10 mg via INTRAVENOUS
  Filled 2023-12-24: qty 1

## 2023-12-24 NOTE — Patient Instructions (Signed)

## 2023-12-30 ENCOUNTER — Encounter: Payer: Self-pay | Admitting: *Deleted

## 2023-12-31 ENCOUNTER — Encounter: Payer: Self-pay | Admitting: Adult Health

## 2023-12-31 ENCOUNTER — Inpatient Hospital Stay

## 2023-12-31 ENCOUNTER — Inpatient Hospital Stay (HOSPITAL_BASED_OUTPATIENT_CLINIC_OR_DEPARTMENT_OTHER): Admitting: Adult Health

## 2023-12-31 VITALS — BP 117/70 | HR 109 | Temp 97.8°F | Resp 18 | Ht 66.0 in | Wt 165.9 lb

## 2023-12-31 DIAGNOSIS — C50312 Malignant neoplasm of lower-inner quadrant of left female breast: Secondary | ICD-10-CM

## 2023-12-31 DIAGNOSIS — Z95828 Presence of other vascular implants and grafts: Secondary | ICD-10-CM

## 2023-12-31 DIAGNOSIS — Z170 Estrogen receptor positive status [ER+]: Secondary | ICD-10-CM

## 2023-12-31 DIAGNOSIS — Z5111 Encounter for antineoplastic chemotherapy: Secondary | ICD-10-CM | POA: Diagnosis not present

## 2023-12-31 LAB — CMP (CANCER CENTER ONLY)
ALT: 19 U/L (ref 0–44)
AST: 17 U/L (ref 15–41)
Albumin: 4 g/dL (ref 3.5–5.0)
Alkaline Phosphatase: 102 U/L (ref 38–126)
Anion gap: 4 — ABNORMAL LOW (ref 5–15)
BUN: 10 mg/dL (ref 6–20)
CO2: 28 mmol/L (ref 22–32)
Calcium: 9.3 mg/dL (ref 8.9–10.3)
Chloride: 106 mmol/L (ref 98–111)
Creatinine: 0.75 mg/dL (ref 0.44–1.00)
GFR, Estimated: >60 mL/min (ref >60)
Glucose, Bld: 133 mg/dL — ABNORMAL HIGH (ref 70–99)
Potassium: 3.8 mmol/L (ref 3.5–5.1)
Sodium: 138 mmol/L (ref 135–145)
Total Bilirubin: 0.4 mg/dL (ref 0.0–1.2)
Total Protein: 6.8 g/dL (ref 6.5–8.1)

## 2023-12-31 LAB — CBC WITH DIFFERENTIAL (CANCER CENTER ONLY)
Abs Immature Granulocytes: 0.07 10*3/uL (ref 0.00–0.07)
Basophils Absolute: 0.2 10*3/uL — ABNORMAL HIGH (ref 0.0–0.1)
Basophils Relative: 2 %
Eosinophils Absolute: 0.3 10*3/uL (ref 0.0–0.5)
Eosinophils Relative: 3 %
HCT: 29.9 % — ABNORMAL LOW (ref 36.0–46.0)
Hemoglobin: 10.1 g/dL — ABNORMAL LOW (ref 12.0–15.0)
Immature Granulocytes: 1 %
Lymphocytes Relative: 12 %
Lymphs Abs: 1.2 10*3/uL (ref 0.7–4.0)
MCH: 31.1 pg (ref 26.0–34.0)
MCHC: 33.8 g/dL (ref 30.0–36.0)
MCV: 92 fL (ref 80.0–100.0)
Monocytes Absolute: 0.6 10*3/uL (ref 0.1–1.0)
Monocytes Relative: 6 %
Neutro Abs: 7.7 10*3/uL (ref 1.7–7.7)
Neutrophils Relative %: 76 %
Platelet Count: 332 10*3/uL (ref 150–400)
RBC: 3.25 MIL/uL — ABNORMAL LOW (ref 3.87–5.11)
RDW: 15.8 % — ABNORMAL HIGH (ref 11.5–15.5)
WBC Count: 10.1 10*3/uL (ref 4.0–10.5)
nRBC: 0 % (ref 0.0–0.2)

## 2023-12-31 LAB — PREGNANCY, URINE: Preg Test, Ur: NEGATIVE

## 2023-12-31 MED ORDER — DIPHENHYDRAMINE HCL 50 MG/ML IJ SOLN
25.0000 mg | Freq: Once | INTRAMUSCULAR | Status: AC
  Administered 2023-12-31: 25 mg via INTRAVENOUS
  Filled 2023-12-31: qty 1

## 2023-12-31 MED ORDER — SODIUM CHLORIDE 0.9% FLUSH
10.0000 mL | Freq: Once | INTRAVENOUS | Status: AC
  Administered 2023-12-31: 10 mL

## 2023-12-31 MED ORDER — FAMOTIDINE IN NACL 20-0.9 MG/50ML-% IV SOLN
20.0000 mg | Freq: Once | INTRAVENOUS | Status: AC
  Administered 2023-12-31: 20 mg via INTRAVENOUS
  Filled 2023-12-31: qty 50

## 2023-12-31 MED ORDER — DEXAMETHASONE SODIUM PHOSPHATE 10 MG/ML IJ SOLN
10.0000 mg | Freq: Once | INTRAMUSCULAR | Status: AC
  Administered 2023-12-31: 10 mg via INTRAVENOUS
  Filled 2023-12-31: qty 1

## 2023-12-31 MED ORDER — SODIUM CHLORIDE 0.9 % IV SOLN: INTRAVENOUS | Status: DC

## 2023-12-31 MED ORDER — SODIUM CHLORIDE 0.9 % IV SOLN
80.0000 mg/m2 | Freq: Once | INTRAVENOUS | Status: AC
  Administered 2023-12-31: 144 mg via INTRAVENOUS
  Filled 2023-12-31: qty 24

## 2023-12-31 NOTE — Assessment & Plan Note (Addendum)
 08/20/2023:Screening mammogram detected left breast distortion at 7 o'clock position measuring 1.2 cm.  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 Adjuvant radiation therapy Adjuvant antiestrogen therapy with ovarian function suppression and anastrozole, along with Verzenio --------------------------------------------------------------------------------------------------------------------- Current treatment: Completed 4 cycles of dose dense Adriamycin  and Cytoxan , today cycle 4 Taxol  Chemo toxicities: Fatigue: Recommended energy conservation Recommended she begin walking a few times a week when she has the energy to help with reconditioning Mild anemia: Stable, today's hemoglobin is 10.1 Will proceed to treatment today without any dose reductions or modifications as she is tolerating treatment well and her labs are within parameters.  Return to clinic weekly for chemotherapy

## 2023-12-31 NOTE — Progress Notes (Signed)
 [facility] Cancer Follow up:    Alicia Bayley, PA-C [address] [place] [address]   DIAGNOSIS:  Cancer Staging  Malignant neoplasm of lower-inner quadrant of left breast in female, estrogen receptor positive (HCC) Staging form: Breast, AJCC 8th Edition - Clinical stage from 08/26/2023: Stage IA (cT1c, cN0(f), cM0, G2, ER+, PR+, HER2-) - Signed by Cameron Cea, MD on 08/28/2023 Stage prefix: Initial diagnosis Method of lymph node assessment: Core biopsy Histologic grading system: 3 grade system - Pathologic stage from 09/17/2023: Stage IA (pT1c, pN1a, cM0, G1, ER+, PR+, HER2-) - Signed by Percival Brace, NP on 12/31/2023 Stage prefix: Initial diagnosis Histologic grading system: 3 grade system    SUMMARY OF ONCOLOGIC HISTORY: Oncology History  Malignant neoplasm of lower-inner quadrant of left breast in female, estrogen receptor positive (HCC)  08/20/2023 Initial Diagnosis   Screening mammogram detected left breast distortion at 7 o'clock position measuring 1.2 cm.  Extra lymph node biopsy benign, biopsy of the breast: Grade 2 IDC ER 90%, PR 90%, HER2 negative by FISH, Ki-67 5%   08/26/2023 Cancer Staging   Staging form: Breast, AJCC 8th Edition - Clinical stage from 08/26/2023: Stage IA (cT1c, cN0(f), cM0, G2, ER+, PR+, HER2-) - Signed by Cameron Cea, MD on 08/28/2023 Stage prefix: Initial diagnosis Method of lymph node assessment: Core biopsy Histologic grading system: 3 grade system   09/05/2023 Genetic Testing   Negative [facility] CancerNext-Expanded +RNAinsight Panel.  Report date is 09/05/2023.   The CancerNext-Expanded gene panel offered by [facility] and includes sequencing, rearrangement, and RNA analysis for the following 76 genes: AIP, ALK, APC, ATM, AXIN2, BAP1, BARD1, BMPR1A, BRCA1, BRCA2, BRIP1, CDC73, CDH1, CDK4, CDKN1B, CDKN2A, CEBPA, CHEK2, CTNNA1, DDX41, DICER1, ETV6, FH, FLCN, GATA2, LZTR1, MAX, MBD4, MEN1, MET, MLH1, MSH2,  MSH3, MSH6, MUTYH, NF1, NF2, NTHL1, PALB2, PHOX2B, PMS2, POT1, PRKAR1A, PTCH1, PTEN, RAD51C, RAD51D, RB1, RET, RUNX1, SDHA, SDHAF2, SDHB, SDHC, SDHD, SMAD4, SMARCA4, SMARCB1, SMARCE1, STK11, SUFU, TMEM127, TP53, TSC1, TSC2, VHL, and WT1 (sequencing and deletion/duplication); EGFR, HOXB13, KIT, MITF, PDGFRA, POLD1, and POLE (sequencing only); EPCAM and GREM1 (deletion/duplication only).    09/17/2023 Surgery    Left lumpectomy: Grade 1 IDC 2 cm with intermediate grade DCIS, margins negative, LVI identified, ER 90%, PR 90%, HER2 negative by FISH, Ki-67 5%, 1/5 lymph nodes positive    09/17/2023 Cancer Staging   Staging form: Breast, AJCC 8th Edition - Pathologic stage from 09/17/2023: Stage IA (pT1c, pN1a, cM0, G1, ER+, PR+, HER2-) - Signed by Percival Brace, NP on 12/31/2023 Stage prefix: Initial diagnosis Histologic grading system: 3 grade system   10/15/2023 -  Chemotherapy   Patient is on Treatment Plan : BREAST DOSE DENSE AC q14d / PACLitaxel  q7d       CURRENT THERAPY: Taxol    INTERVAL HISTORY:  Alicia Roach 48 y.o. female returns for follow-up and evaluation prior to receiving her fourth weekly dose of Taxol .  She tells me that she is mildly fatigued but is otherwise tolerating treatment well.  She notes it is much better than the first for Adriamycin  and Cytoxan  treatments that she received.   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  MEDICAL HISTORY: Past Medical History:  Diagnosis Date   Cancer [facility])    Depression    GAD  (generalized anxiety disorder)    Dr. 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    SURGICAL HISTORY: Past Surgical History:  Procedure Laterality Date   BREAST BIOPSY Left 08/20/2023   US  LT BREAST BX W LOC DEV 1ST LESION IMG BX SPEC US  GUIDE 08/20/2023 [facility]   BREAST BIOPSY Left 09/16/2023   US  LT RADIOACTIVE SEED LOC 09/16/2023 [facility]MO[facility]   BREAST BIOPSY Left 09/16/2023   US  LT RADIOACTIVE SEED EA ADD LESION 09/16/2023 [facility]AMMO[facility]   BREAST LUMPECTOMY WITH RADIOACTIVE SEED AND SENTINEL LYMPH NODE BIOPSY Left 09/17/2023   Procedure: LEFT BREAST SEED GUIDED LUMPECTOMY, LEFT AXILLARY SENTINEL NODE BIOPSY;  Surgeon: Enid Harry, MD;  Location: [facility] OR;  Service: General;  Laterality: Left;  GEN PEC BLOCK   CESAREAN SECTION  10/11/2011   Procedure: CESAREAN SECTION;  Surgeon: Piedad Brewer, MD;  Location: [facility] ORS;  Service: Gynecology;  Laterality: N/A;   digital reconstructive surgery (middle finger of L hand)     PORTACATH PLACEMENT Right 10/07/2023   Procedure: INSERTION PORT-A-CATH WITH ULTRASOUND GUIDANCE;  Surgeon: Enid Harry, MD;  Location: [facility];  Service: General;  Laterality: Right;   RADIOACTIVE SEED GUIDED AXILLARY SENTINEL LYMPH NODE Left 09/17/2023   Procedure: LEFT AXILLARY NODE SEED GUIDED EXCISION;  Surgeon: Enid Harry, MD;  Location: [facility] OR;  Service: General;  Laterality: Left;   TONSILLECTOMY  1999   WISDOM TOOTH EXTRACTION      SOCIAL HISTORY: Social History   Socioeconomic History   Marital status: Divorced    Spouse name: Not on file   Number of children: Not on file   Years of education: Not on file   Highest education level: Not on file  Occupational History   Not on file  Tobacco Use   Smoking status: Former    Current packs/day:  0.00    Types: Cigarettes    Start date: 08/20/1993    Quit date: 08/20/2001    Years since quitting: 22.3   Smokeless tobacco: Never  Vaping Use   Vaping status: Never Used  Substance and Sexual Activity   Alcohol use: Yes    Comment: glass of wine on occassion   Drug use: No   Sexual activity: Yes    Birth control/protection: None  Other Topics Concern   Not on file  Social History Narrative   Married, 1 son 1/2 yr old.   Banker for [employer].   HS at [employer] in [place] but lived in [place], [place] prior.   Smoked some in HS/College.  Occasional alcohol.         Social Drivers of Health   Financial Resource Strain: Low Risk  (06/13/2021)   Received from [facility] visits prior to 10/20/2022.   Overall Financial Resource Strain (CARDIA)    Difficulty of Paying Living Expenses: Not very hard  Food Insecurity: No Food Insecurity (08/28/2023)   Hunger Vital Sign    Worried About Running Out of Food in the Last Year: Never true    Ran Out of Food in  the Last Year: Never true  Transportation Needs: No Transportation Needs (08/28/2023)   PRAPARE - Administrator, Civil Service (Medical): No    Lack of Transportation (Non-Medical): No  Physical Activity: Sufficiently Active (06/13/2021)   Received from [facility] visits prior to 10/20/2022.   Exercise Vital Sign    Days of Exercise per Week: 5 days    Minutes of Exercise per Session: 50 min  Stress: Not on file (06/26/2023)  Social Connections: Unknown (01/01/2022)   Received from [facility]   Social Network    Social Network: Not on file  Intimate Partner Violence: Not At Risk (08/28/2023)   Humiliation, Afraid, Rape, and Kick questionnaire    Fear of Current or Ex-Partner: No    Emotionally Abused: No    Physically Abused: No    Sexually Abused: No    FAMILY HISTORY: Family History  Problem Relation Age of Onset   Hyperthyroidism Mother    Depression Mother    Depression Sister     Prostate cancer Maternal Uncle        x2 mat uncles   Cancer Maternal Uncle        unknown; mets dx >50   Skin cancer Paternal Aunt    Prostate cancer Paternal Uncle    Prostate cancer Cousin        mat cousin; mets; reported BRCA1 pos   Colon cancer Cousin        mat female cousin; mets; reported BRCA1 pos   Ovarian cancer Cousin        mat cousin; reported BRCA1 pos   Stomach cancer Cousin        mat cousin   Cancer Cousin        mat cousin; lung and kidney cancers    Review of Systems  Constitutional:  Positive for fatigue. Negative for appetite change, chills, fever and unexpected weight change.  HENT:   Negative for hearing loss, lump/mass and trouble swallowing.   Eyes:  Negative for eye problems and icterus.  Respiratory:  Negative for chest tightness, cough and shortness of breath.   Cardiovascular:  Negative for chest pain, leg swelling and palpitations.  Gastrointestinal:  Negative for abdominal distention, abdominal pain, constipation, diarrhea, nausea and vomiting.  Endocrine: Negative for hot flashes.  Genitourinary:  Negative for difficulty urinating.   Musculoskeletal:  Negative for arthralgias.  Skin:  Negative for itching and rash.  Neurological:  Negative for dizziness, extremity weakness, headaches and numbness.  Hematological:  Negative for adenopathy. Does not bruise/bleed easily.  Psychiatric/Behavioral:  Negative for depression. The patient is not nervous/anxious.       PHYSICAL EXAMINATION    Vitals:   12/31/23 1301  BP: 117/70  Pulse: (!) 109  Resp: 18  Temp: 97.8 F (36.6 C)  SpO2: 100%    Physical Exam Constitutional:      General: She is not in acute distress.    Appearance: Normal appearance. She is not toxic-appearing.  HENT:     Head: Normocephalic and atraumatic.     Mouth/Throat:     Mouth: Mucous membranes are moist.     Pharynx: Oropharynx is clear. No oropharyngeal exudate or posterior oropharyngeal erythema.  Eyes:      General: No scleral icterus. Cardiovascular:     Rate and Rhythm: Normal rate and regular rhythm.     Pulses: Normal pulses.     Heart sounds: Normal heart sounds.  Pulmonary:     Effort:  Pulmonary effort is normal.     Breath sounds: Normal breath sounds.  Abdominal:     General: Abdomen is flat. Bowel sounds are normal. There is no distension.     Palpations: Abdomen is soft.     Tenderness: There is no abdominal tenderness.  Musculoskeletal:        General: No swelling.     Cervical back: Neck supple.  Lymphadenopathy:     Cervical: No cervical adenopathy.  Skin:    General: Skin is warm and dry.     Findings: No rash.  Neurological:     General: No focal deficit present.     Mental Status: She is alert.  Psychiatric:        Mood and Affect: Mood normal.        Behavior: Behavior normal.     LABORATORY DATA:  CBC    Component Value Date/Time   WBC 10.1 12/31/2023 1229   WBC 12.4 (H) 10/12/2011 0539   RBC 3.25 (L) 12/31/2023 1229   HGB 10.1 (L) 12/31/2023 1229   HCT 29.9 (L) 12/31/2023 1229   PLT 332 12/31/2023 1229   MCV 92.0 12/31/2023 1229   MCH 31.1 12/31/2023 1229   MCHC 33.8 12/31/2023 1229   RDW 15.8 (H) 12/31/2023 1229   LYMPHSABS 1.2 12/31/2023 1229   MONOABS 0.6 12/31/2023 1229   EOSABS 0.3 12/31/2023 1229   BASOSABS 0.2 (H) 12/31/2023 1229    CMP     Component Value Date/Time   NA 138 12/31/2023 1229   K 3.8 12/31/2023 1229   CL 106 12/31/2023 1229   CO2 28 12/31/2023 1229   GLUCOSE 133 (H) 12/31/2023 1229   BUN 10 12/31/2023 1229   CREATININE 0.75 12/31/2023 1229   CALCIUM 9.3 12/31/2023 1229   PROT 6.8 12/31/2023 1229   ALBUMIN 4.0 12/31/2023 1229   AST 17 12/31/2023 1229   ALT 19 12/31/2023 1229   ALKPHOS 102 12/31/2023 1229   BILITOT 0.4 12/31/2023 1229   GFRNONAA >60 12/31/2023 1229     ASSESSMENT and THERAPY PLAN:   Malignant neoplasm of lower-inner quadrant of left breast in female, estrogen receptor positive  (HCC) 08/20/2023:Screening mammogram detected left breast distortion at 7 o'clock position measuring 1.2 cm.  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 Adjuvant radiation therapy Adjuvant antiestrogen therapy with ovarian function suppression and anastrozole, along with Verzenio --------------------------------------------------------------------------------------------------------------------- Current treatment: Completed 4 cycles of dose dense Adriamycin  and Cytoxan , today cycle 4 Taxol  Chemo toxicities: Fatigue: Recommended energy conservation Recommended she begin walking a few times a week when she has the energy to help with reconditioning Mild anemia: Stable, today's hemoglobin is 10.1 Will proceed to treatment today without any dose reductions or modifications as she is tolerating treatment well and her labs are within parameters.  Return to clinic weekly for chemotherapy     All questions were answered. The patient knows to call the clinic with any problems, questions or concerns. We can certainly see the patient much sooner if necessary.  Total encounter time:20 minutes*in face-to-face visit time, chart review, lab review, care coordination, order entry, and documentation  of the encounter time.    Alwin Baars, NP 12/31/23 3:50 PM Medical Oncology and Hematology [facility] [address] [place], [place] [address] Tel. [phone]    Fax. [phone]  *Total Encounter Time as defined by the Centers for Medicare and Medicaid Services includes, in addition to the face-to-face time of a patient  visit (documented in the note above) non-face-to-face time: obtaining and reviewing outside history, ordering and reviewing medications, tests or procedures, care coordination (communications with other health care professionals or caregivers) and documentation in the medical record.

## 2024-01-07 ENCOUNTER — Encounter: Payer: Self-pay | Admitting: *Deleted

## 2024-01-07 ENCOUNTER — Ambulatory Visit: Admitting: Adult Health

## 2024-01-07 ENCOUNTER — Inpatient Hospital Stay

## 2024-01-07 VITALS — BP 129/80 | HR 97 | Temp 98.0°F | Resp 18 | Wt 163.4 lb

## 2024-01-07 DIAGNOSIS — Z5111 Encounter for antineoplastic chemotherapy: Secondary | ICD-10-CM | POA: Diagnosis not present

## 2024-01-07 DIAGNOSIS — C50312 Malignant neoplasm of lower-inner quadrant of left female breast: Secondary | ICD-10-CM

## 2024-01-07 DIAGNOSIS — Z95828 Presence of other vascular implants and grafts: Secondary | ICD-10-CM

## 2024-01-07 LAB — CMP (CANCER CENTER ONLY)
ALT: 15 U/L (ref 0–44)
AST: 14 U/L — ABNORMAL LOW (ref 15–41)
Albumin: 4 g/dL (ref 3.5–5.0)
Alkaline Phosphatase: 100 U/L (ref 38–126)
Anion gap: 5 (ref 5–15)
BUN: 13 mg/dL (ref 6–20)
CO2: 26 mmol/L (ref 22–32)
Calcium: 9.2 mg/dL (ref 8.9–10.3)
Chloride: 107 mmol/L (ref 98–111)
Creatinine: 0.68 mg/dL (ref 0.44–1.00)
GFR, Estimated: >60 mL/min (ref >60)
Glucose, Bld: 86 mg/dL (ref 70–99)
Potassium: 4 mmol/L (ref 3.5–5.1)
Sodium: 138 mmol/L (ref 135–145)
Total Bilirubin: 0.4 mg/dL (ref 0.0–1.2)
Total Protein: 6.8 g/dL (ref 6.5–8.1)

## 2024-01-07 LAB — CBC WITH DIFFERENTIAL (CANCER CENTER ONLY)
Abs Immature Granulocytes: 0.05 10*3/uL (ref 0.00–0.07)
Basophils Absolute: 0.1 10*3/uL (ref 0.0–0.1)
Basophils Relative: 2 %
Eosinophils Absolute: 0.3 10*3/uL (ref 0.0–0.5)
Eosinophils Relative: 4 %
HCT: 30.2 % — ABNORMAL LOW (ref 36.0–46.0)
Hemoglobin: 10.3 g/dL — ABNORMAL LOW (ref 12.0–15.0)
Immature Granulocytes: 1 %
Lymphocytes Relative: 16 %
Lymphs Abs: 1.3 10*3/uL (ref 0.7–4.0)
MCH: 31.3 pg (ref 26.0–34.0)
MCHC: 34.1 g/dL (ref 30.0–36.0)
MCV: 91.8 fL (ref 80.0–100.0)
Monocytes Absolute: 0.6 10*3/uL (ref 0.1–1.0)
Monocytes Relative: 8 %
Neutro Abs: 5.4 10*3/uL (ref 1.7–7.7)
Neutrophils Relative %: 69 %
Platelet Count: 311 10*3/uL (ref 150–400)
RBC: 3.29 MIL/uL — ABNORMAL LOW (ref 3.87–5.11)
RDW: 15.1 % (ref 11.5–15.5)
WBC Count: 7.8 10*3/uL (ref 4.0–10.5)
nRBC: 0 % (ref 0.0–0.2)

## 2024-01-07 MED ORDER — SODIUM CHLORIDE 0.9% FLUSH
10.0000 mL | Freq: Once | INTRAVENOUS | Status: AC
Start: 2024-01-07 — End: 2024-01-07
  Administered 2024-01-07: 10 mL

## 2024-01-07 MED ORDER — FAMOTIDINE IN NACL 20-0.9 MG/50ML-% IV SOLN
20.0000 mg | Freq: Once | INTRAVENOUS | Status: AC
  Administered 2024-01-07: 20 mg via INTRAVENOUS
  Filled 2024-01-07: qty 50

## 2024-01-07 MED ORDER — DIPHENHYDRAMINE HCL 50 MG/ML IJ SOLN
25.0000 mg | Freq: Once | INTRAMUSCULAR | Status: AC
  Administered 2024-01-07: 25 mg via INTRAVENOUS
  Filled 2024-01-07: qty 1

## 2024-01-07 MED ORDER — HEPARIN SOD (PORK) LOCK FLUSH 100 UNIT/ML IV SOLN
500.0000 [IU] | Freq: Once | INTRAVENOUS | Status: AC | PRN
  Administered 2024-01-07: 500 [IU]

## 2024-01-07 MED ORDER — DEXAMETHASONE SODIUM PHOSPHATE 10 MG/ML IJ SOLN
10.0000 mg | Freq: Once | INTRAMUSCULAR | Status: AC
  Administered 2024-01-07: 10 mg via INTRAVENOUS
  Filled 2024-01-07: qty 1

## 2024-01-07 MED ORDER — SODIUM CHLORIDE 0.9% FLUSH
10.0000 mL | INTRAVENOUS | Status: DC | PRN
  Administered 2024-01-07: 10 mL

## 2024-01-07 MED ORDER — SODIUM CHLORIDE 0.9 % IV SOLN
80.0000 mg/m2 | Freq: Once | INTRAVENOUS | Status: AC
  Administered 2024-01-07: 144 mg via INTRAVENOUS
  Filled 2024-01-07: qty 24

## 2024-01-07 MED ORDER — SODIUM CHLORIDE 0.9 % IV SOLN
INTRAVENOUS | Status: DC
Start: 2024-01-07 — End: 2024-01-07

## 2024-01-07 NOTE — Patient Instructions (Signed)

## 2024-01-14 ENCOUNTER — Encounter: Payer: Self-pay | Admitting: Adult Health

## 2024-01-14 ENCOUNTER — Inpatient Hospital Stay (HOSPITAL_BASED_OUTPATIENT_CLINIC_OR_DEPARTMENT_OTHER): Admitting: Adult Health

## 2024-01-14 ENCOUNTER — Inpatient Hospital Stay

## 2024-01-14 VITALS — HR 100

## 2024-01-14 VITALS — BP 117/79 | HR 103 | Temp 97.7°F | Resp 18 | Ht 66.0 in | Wt 164.1 lb

## 2024-01-14 DIAGNOSIS — Z5111 Encounter for antineoplastic chemotherapy: Secondary | ICD-10-CM | POA: Diagnosis not present

## 2024-01-14 DIAGNOSIS — Z170 Estrogen receptor positive status [ER+]: Secondary | ICD-10-CM

## 2024-01-14 DIAGNOSIS — C50312 Malignant neoplasm of lower-inner quadrant of left female breast: Secondary | ICD-10-CM | POA: Diagnosis not present

## 2024-01-14 DIAGNOSIS — Z95828 Presence of other vascular implants and grafts: Secondary | ICD-10-CM

## 2024-01-14 LAB — CBC WITH DIFFERENTIAL (CANCER CENTER ONLY)
Abs Immature Granulocytes: 0.07 10*3/uL (ref 0.00–0.07)
Basophils Absolute: 0.1 10*3/uL (ref 0.0–0.1)
Basophils Relative: 1 %
Eosinophils Absolute: 0.3 10*3/uL (ref 0.0–0.5)
Eosinophils Relative: 5 %
HCT: 30.6 % — ABNORMAL LOW (ref 36.0–46.0)
Hemoglobin: 10.5 g/dL — ABNORMAL LOW (ref 12.0–15.0)
Immature Granulocytes: 1 %
Lymphocytes Relative: 19 %
Lymphs Abs: 1.4 10*3/uL (ref 0.7–4.0)
MCH: 31.6 pg (ref 26.0–34.0)
MCHC: 34.3 g/dL (ref 30.0–36.0)
MCV: 92.2 fL (ref 80.0–100.0)
Monocytes Absolute: 0.5 10*3/uL (ref 0.1–1.0)
Monocytes Relative: 7 %
Neutro Abs: 4.7 10*3/uL (ref 1.7–7.7)
Neutrophils Relative %: 67 %
Platelet Count: 319 10*3/uL (ref 150–400)
RBC: 3.32 MIL/uL — ABNORMAL LOW (ref 3.87–5.11)
RDW: 14.8 % (ref 11.5–15.5)
WBC Count: 7 10*3/uL (ref 4.0–10.5)
nRBC: 0 % (ref 0.0–0.2)

## 2024-01-14 LAB — CMP (CANCER CENTER ONLY)
ALT: 21 U/L (ref 0–44)
AST: 21 U/L (ref 15–41)
Albumin: 4.2 g/dL (ref 3.5–5.0)
Alkaline Phosphatase: 104 U/L (ref 38–126)
Anion gap: 8 (ref 5–15)
BUN: 13 mg/dL (ref 6–20)
CO2: 27 mmol/L (ref 22–32)
Calcium: 10 mg/dL (ref 8.9–10.3)
Chloride: 104 mmol/L (ref 98–111)
Creatinine: 0.7 mg/dL (ref 0.44–1.00)
GFR, Estimated: >60 mL/min (ref >60)
Glucose, Bld: 93 mg/dL (ref 70–99)
Potassium: 4.2 mmol/L (ref 3.5–5.1)
Sodium: 139 mmol/L (ref 135–145)
Total Bilirubin: 0.4 mg/dL (ref 0.0–1.2)
Total Protein: 7.4 g/dL (ref 6.5–8.1)

## 2024-01-14 MED ORDER — SODIUM CHLORIDE 0.9 % IV SOLN
80.0000 mg/m2 | Freq: Once | INTRAVENOUS | Status: AC
  Administered 2024-01-14: 144 mg via INTRAVENOUS
  Filled 2024-01-14: qty 24

## 2024-01-14 MED ORDER — FAMOTIDINE IN NACL 20-0.9 MG/50ML-% IV SOLN
20.0000 mg | Freq: Once | INTRAVENOUS | Status: AC
  Administered 2024-01-14: 20 mg via INTRAVENOUS
  Filled 2024-01-14: qty 50

## 2024-01-14 MED ORDER — SODIUM CHLORIDE 0.9% FLUSH
10.0000 mL | Freq: Once | INTRAVENOUS | Status: AC
  Administered 2024-01-14: 10 mL

## 2024-01-14 MED ORDER — DEXAMETHASONE SODIUM PHOSPHATE 10 MG/ML IJ SOLN
10.0000 mg | Freq: Once | INTRAMUSCULAR | Status: AC
  Administered 2024-01-14: 10 mg via INTRAVENOUS
  Filled 2024-01-14: qty 1

## 2024-01-14 MED ORDER — SODIUM CHLORIDE 0.9 % IV SOLN: INTRAVENOUS | Status: DC

## 2024-01-14 MED ORDER — CETIRIZINE HCL 10 MG/ML IV SOLN
10.0000 mg | Freq: Once | INTRAVENOUS | Status: AC
  Administered 2024-01-14: 10 mg via INTRAVENOUS
  Filled 2024-01-14: qty 1

## 2024-01-14 NOTE — Progress Notes (Signed)
 [facility] Cancer Follow up:    Alicia Bayley, PA-C [address] [place] [address]   DIAGNOSIS:  Cancer Staging  Malignant neoplasm of lower-inner quadrant of left breast in female, estrogen receptor positive (HCC) Staging form: Breast, AJCC 8th Edition - Clinical stage from 08/26/2023: Stage IA (cT1c, cN0(f), cM0, G2, ER+, PR+, HER2-) - Signed by Cameron Cea, MD on 08/28/2023 Stage prefix: Initial diagnosis Method of lymph node assessment: Core biopsy Histologic grading system: 3 grade system - Pathologic stage from 09/17/2023: Stage IA (pT1c, pN1a, cM0, G1, ER+, PR+, HER2-) - Signed by Percival Brace, NP on 12/31/2023 Stage prefix: Initial diagnosis Histologic grading system: 3 grade system    SUMMARY OF ONCOLOGIC HISTORY: Oncology History  Malignant neoplasm of lower-inner quadrant of left breast in female, estrogen receptor positive (HCC)  08/20/2023 Initial Diagnosis   Screening mammogram detected left breast distortion at 7 o'clock position measuring 1.2 cm.  Extra lymph node biopsy benign, biopsy of the breast: Grade 2 IDC ER 90%, PR 90%, HER2 negative by FISH, Ki-67 5%   08/26/2023 Cancer Staging   Staging form: Breast, AJCC 8th Edition - Clinical stage from 08/26/2023: Stage IA (cT1c, cN0(f), cM0, G2, ER+, PR+, HER2-) - Signed by Cameron Cea, MD on 08/28/2023 Stage prefix: Initial diagnosis Method of lymph node assessment: Core biopsy Histologic grading system: 3 grade system   09/05/2023 Genetic Testing   Negative [facility] CancerNext-Expanded +RNAinsight Panel.  Report date is 09/05/2023.   The CancerNext-Expanded gene panel offered by [facility] and includes sequencing, rearrangement, and RNA analysis for the following 76 genes: AIP, ALK, APC, ATM, AXIN2, BAP1, BARD1, BMPR1A, BRCA1, BRCA2, BRIP1, CDC73, CDH1, CDK4, CDKN1B, CDKN2A, CEBPA, CHEK2, CTNNA1, DDX41, DICER1, ETV6, FH, FLCN, GATA2, LZTR1, MAX, MBD4, MEN1, MET, MLH1, MSH2,  MSH3, MSH6, MUTYH, NF1, NF2, NTHL1, PALB2, PHOX2B, PMS2, POT1, PRKAR1A, PTCH1, PTEN, RAD51C, RAD51D, RB1, RET, RUNX1, SDHA, SDHAF2, SDHB, SDHC, SDHD, SMAD4, SMARCA4, SMARCB1, SMARCE1, STK11, SUFU, TMEM127, TP53, TSC1, TSC2, VHL, and WT1 (sequencing and deletion/duplication); EGFR, HOXB13, KIT, MITF, PDGFRA, POLD1, and POLE (sequencing only); EPCAM and GREM1 (deletion/duplication only).    09/17/2023 Surgery    Left lumpectomy: Grade 1 IDC 2 cm with intermediate grade DCIS, margins negative, LVI identified, ER 90%, PR 90%, HER2 negative by FISH, Ki-67 5%, 1/5 lymph nodes positive    09/17/2023 Cancer Staging   Staging form: Breast, AJCC 8th Edition - Pathologic stage from 09/17/2023: Stage IA (pT1c, pN1a, cM0, G1, ER+, PR+, HER2-) - Signed by Percival Brace, NP on 12/31/2023 Stage prefix: Initial diagnosis Histologic grading system: 3 grade system   10/15/2023 -  Chemotherapy   Patient is on Treatment Plan : BREAST DOSE DENSE AC q14d / PACLitaxel  q7d       CURRENT THERAPY: weekly taxol   INTERVAL HISTORY: Discussed the use of AI scribe software for clinical note transcription with the patient, who gave verbal consent to proceed.  History of Present Illness Alicia Roach "Alicia Roach" is a 48 year old female undergoing adjuvant chemotherapy for breast cancer who presents for follow-up prior to receiving weekly Taxol .  Alicia Roach has completed four cycles of Adriamycin  and Cytoxan  and is currently on cycle six of weekly Taxol . Alicia Roach experiences significant restless legs during treatment, affecting her ability to rest. There is no numbness or tingling in her fingertips or toes. Alicia Roach does not experience nausea but tires easily and often naps after activities. No issues with taste changes on Taxol . Occasional dizziness occurs when standing  quickly. No cough, shortness of breath, or bladder issues. 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  MEDICAL HISTORY: Past Medical History:  Diagnosis Date   Cancer [facility])    Depression    GAD (generalized anxiety disorder)    Dr. 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    SURGICAL HISTORY: Past Surgical History:  Procedure Laterality Date   BREAST BIOPSY Left 08/20/2023   US  LT BREAST BX W LOC DEV 1ST LESION IMG BX SPEC US  GUIDE 08/20/2023 [facility] MAMMOGRAPHY   BREAST BIOPSY Left 09/16/2023   US  LT RADIOACTIVE SEED LOC 09/16/2023 [facility] MAMMOGRAP[facility]   BREAST BIOPSY Left 09/16/2023   US  LT RADIOACTIVE SEED EA ADD LESION 09/16/2023 [facility] MAMMOGRAPHY   BREAST LUMPECTOMY WITH RADIOACTIVE SEED AND SENTINEL LYMPH NODE BIOPSY Left 09/17/2023   Procedure: LEFT BREAST SEED GUIDED LUMPECTOMY, LEFT AXILLARY SENTINEL NODE BIOPSY;  Surgeon: Enid Harry, MD;  Location: [facility] OR;  Service: General;  Laterality: Left;  GEN PEC BLOCK   CESAREAN SECTION  10/11/2011   Procedure: CESAREAN SECTION;  Surgeon: Piedad Brewer, MD;  Location: [facility] ORS;  Service: Gynecology;  Laterality: N/A;   digital reconstructive surgery (middle finger of L hand)     PORTACATH PLACEMENT Right 10/07/2023   Procedure: INSERTION PORT-A-CATH WITH ULTRASOUND GUIDANCE;  Surgeon: Enid Harry, MD;  Location: [facility];  Service: General;   Laterality: Right;   RADIOACTIVE SEED GUIDED AXILLARY SENTINEL LYMPH NODE Left 09/17/2023   Procedure: LEFT AXILLARY NODE SEED GUIDED EXCISION;  Surgeon: Enid Harry, MD;  Location: [facility] OR;  Service: General;  Laterality: Left;   TONSILLECTOMY  1999   WISDOM TOOTH EXTRACTION      SOCIAL HISTORY: Social History   Socioeconomic History   Marital status: Divorced    Spouse name: Not on file   Number of children: Not on file   Years of education: Not on file   Highest education level: Not on file  Occupational History   Not on file  Tobacco Use   Smoking status: Former    Current packs/day: 0.00    Types: Cigarettes    Start date: 08/20/1993    Quit date: 08/20/2001    Years since quitting: 22.4   Smokeless tobacco: Never  Vaping Use   Vaping status: Never Used  Substance and Sexual Activity   Alcohol use: Yes    Comment: glass of wine on occassion   Drug use: No   Sexual activity: Yes    Birth control/protection: None  Other Topics Concern   Not on file  Social History Narrative   Married, 1 son 1/2 yr old.   Banker for [employer].   HS at [employer] in [place] but lived in  [place], [place] prior.   Smoked some in HS/College.  Occasional alcohol.         Social Drivers of Health   Financial Resource Strain: Low Risk  (06/13/2021)   Received from [facility] visits prior to 10/20/2022.   Overall Financial Resource Strain (CARDIA)    Difficulty of Paying Living Expenses: Not very hard  Food Insecurity: No Food Insecurity (08/28/2023)   Hunger Vital Sign    Worried About Running Out of Food in the Last Year: Never true    Ran Out of Food in the Last Year: Never true  Transportation Needs: No Transportation Needs (08/28/2023)   PRAPARE - Administrator, Civil Service (Medical): No    Lack of Transportation (Non-Medical): No  Physical Activity: Sufficiently Active (06/13/2021)   Received from [facility] visits prior to 10/20/2022.    Exercise Vital Sign    Days of Exercise per Week: 5 days    Minutes of Exercise per Session: 50 min  Stress: Not on file (06/26/2023)  Social Connections: Unknown (01/01/2022)   Received from [facility]   Social Network    Social Network: Not on file  Intimate Partner Violence: Not At Risk (08/28/2023)   Humiliation, Afraid, Rape, and Kick questionnaire    Fear of Current or Ex-Partner: No    Emotionally Abused: No    Physically Abused: No    Sexually Abused: No    FAMILY HISTORY: Family History  Problem Relation Age of Onset   Hyperthyroidism Mother    Depression Mother    Depression Sister    Prostate cancer Maternal Uncle        x2 mat uncles   Cancer Maternal Uncle        unknown; mets dx >50   Skin cancer Paternal Aunt    Prostate cancer Paternal Uncle    Prostate cancer Cousin        mat cousin; mets; reported BRCA1 pos   Colon cancer Cousin        mat female cousin; mets; reported BRCA1 pos   Ovarian cancer Cousin        mat cousin; reported BRCA1 pos   Stomach cancer Cousin        mat cousin   Cancer Cousin        mat cousin; lung and kidney cancers    Review of Systems  Constitutional:  Negative for appetite change, chills, fatigue, fever and unexpected weight change.  HENT:   Negative for hearing loss, lump/mass and trouble swallowing.   Eyes:  Negative for eye problems and icterus.  Respiratory:  Negative for chest tightness, cough and shortness of breath.   Cardiovascular:  Negative for chest pain, leg swelling and palpitations.  Gastrointestinal:  Negative for abdominal distention, abdominal pain, constipation, diarrhea, nausea and vomiting.  Endocrine: Negative for hot flashes.  Genitourinary:  Negative for difficulty urinating.   Musculoskeletal:  Negative for arthralgias.  Skin:  Negative for itching and rash.  Neurological:  Negative for dizziness, extremity weakness, headaches and numbness.  Hematological:  Negative for adenopathy. Does not  bruise/bleed easily.  Psychiatric/Behavioral:  Negative for depression. The patient is not nervous/anxious.       PHYSICAL EXAMINATION    Vitals:   01/14/24 1124 01/14/24 1136  BP: 117/79 117/79  Pulse: (!) 103 (!) 103  Resp: 18 18  Temp: 97.9 F (36.6 C) 97.7 F (36.5 C)  SpO2: 100% 100%    Physical  Exam Constitutional:      General: Alicia Roach is not in acute distress.    Appearance: Normal appearance. Alicia Roach is not toxic-appearing.  HENT:     Head: Normocephalic and atraumatic.     Mouth/Throat:     Mouth: Mucous membranes are moist.     Pharynx: Oropharynx is clear. No oropharyngeal exudate or posterior oropharyngeal erythema.  Eyes:     General: No scleral icterus. Cardiovascular:     Rate and Rhythm: Normal rate and regular rhythm.     Pulses: Normal pulses.     Heart sounds: Normal heart sounds.  Pulmonary:     Effort: Pulmonary effort is normal.     Breath sounds: Normal breath sounds.  Abdominal:     General: Abdomen is flat. Bowel sounds are normal. There is no distension.     Palpations: Abdomen is soft.     Tenderness: There is no abdominal tenderness.  Musculoskeletal:        General: No swelling.     Cervical back: Neck supple.  Lymphadenopathy:     Cervical: No cervical adenopathy.  Skin:    General: Skin is warm and dry.     Findings: No rash.  Neurological:     General: No focal deficit present.     Mental Status: Alicia Roach is alert.  Psychiatric:        Mood and Affect: Mood normal.        Behavior: Behavior normal.     LABORATORY DATA:  CBC    Component Value Date/Time   WBC 7.0 01/14/2024 1105   WBC 12.4 (H) 10/12/2011 0539   RBC 3.32 (L) 01/14/2024 1105   HGB 10.5 (L) 01/14/2024 1105   HCT 30.6 (L) 01/14/2024 1105   PLT 319 01/14/2024 1105   MCV 92.2 01/14/2024 1105   MCH 31.6 01/14/2024 1105   MCHC 34.3 01/14/2024 1105   RDW 14.8 01/14/2024 1105   LYMPHSABS 1.4 01/14/2024 1105   MONOABS 0.5 01/14/2024 1105   EOSABS 0.3 01/14/2024 1105    BASOSABS 0.1 01/14/2024 1105    CMP     Component Value Date/Time   NA 139 01/14/2024 1105   K 4.2 01/14/2024 1105   CL 104 01/14/2024 1105   CO2 27 01/14/2024 1105   GLUCOSE 93 01/14/2024 1105   BUN 13 01/14/2024 1105   CREATININE 0.70 01/14/2024 1105   CALCIUM 10.0 01/14/2024 1105   PROT 7.4 01/14/2024 1105   ALBUMIN 4.2 01/14/2024 1105   AST 21 01/14/2024 1105   ALT 21 01/14/2024 1105   ALKPHOS 104 01/14/2024 1105   BILITOT 0.4 01/14/2024 1105   GFRNONAA >60 01/14/2024 1105     ASSESSMENT and THERAPY PLAN:   Malignant neoplasm of lower-inner quadrant of left breast in female, estrogen receptor positive (HCC) Assessment and Plan Assessment & Plan Breast cancer undergoing chemotherapy Breast cancer post-lumpectomy, receiving adjuvant chemotherapy with Taxol . Completed Adriamycin  and Cytoxan . No neuropathy or nausea. Fatigue present. Hemoglobin stable at 10-11. Normal organ function. Port removal planned post-chemotherapy, pre-radiation. - Administer weekly Taxol . - Monitor blood counts. - Advise hydration for blood draws. - Schedule port removal post-chemotherapy, pre-radiation.  Chemotherapy-induced anemia Mild anemia due to chemotherapy, hemoglobin stable at 10-11. No transfusion needed. Fatigue related to anemia and chemotherapy. Improvement expected post-chemotherapy. - Monitor hemoglobin levels. - Reassured anemia will improve post-chemotherapy.  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 Switching to IV Zyrtec expected to help. - Switch from IV Benadryl  to IV  Zyrtec.  RTC weekly for labs and treatment, see Dr. Gudena or myself with every other treatment   All questions were answered. The patient knows to call the clinic with any problems, questions or concerns. We can certainly see the patient much sooner if necessary.  Total encounter time:20 minutes*in face-to-face visit time, chart review, lab review,  care coordination, order entry, and documentation of the encounter time.    Alwin Baars, NP 01/14/24 12:36 PM Medical Oncology and Hematology [facility] [address] [place], [place] [address] Tel. [phone]    Fax. [phone]  *Total Encounter Time as defined by the Centers for Medicare and Medicaid Services includes, in addition to the face-to-face time of a patient visit (documented in the note above) non-face-to-face time: obtaining and reviewing outside history, ordering and reviewing medications, tests or procedures, care coordination (communications with other health care professionals or caregivers) and documentation in the medical record.

## 2024-01-14 NOTE — Patient Instructions (Signed)

## 2024-01-14 NOTE — Assessment & Plan Note (Signed)
 Assessment and Plan Assessment & Plan Breast cancer undergoing chemotherapy Breast cancer post-lumpectomy, receiving adjuvant chemotherapy with Taxol . Completed Adriamycin  and Cytoxan . No neuropathy or nausea. Fatigue present. Hemoglobin stable at 10-11. Normal organ function. Port removal planned post-chemotherapy, pre-radiation. - Administer weekly Taxol . - Monitor blood counts. - Advise hydration for blood draws. - Schedule port removal post-chemotherapy, pre-radiation.  Chemotherapy-induced anemia Mild anemia due to chemotherapy, hemoglobin stable at 10-11. No transfusion needed. Fatigue related to anemia and chemotherapy. Improvement expected post-chemotherapy. - Monitor hemoglobin levels. - Reassured anemia will improve post-chemotherapy.  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 Switching to IV Zyrtec expected to help. - Switch from IV Benadryl  to IV Zyrtec.  RTC weekly for labs and treatment, see Dr. Gudena or myself with every other treatment

## 2024-01-17 ENCOUNTER — Encounter: Payer: Self-pay | Admitting: *Deleted

## 2024-01-21 ENCOUNTER — Inpatient Hospital Stay: Attending: Hematology and Oncology

## 2024-01-21 ENCOUNTER — Ambulatory Visit: Admitting: Adult Health

## 2024-01-21 ENCOUNTER — Inpatient Hospital Stay

## 2024-01-21 VITALS — BP 116/77 | HR 97 | Temp 98.9°F | Wt 166.4 lb

## 2024-01-21 DIAGNOSIS — Z170 Estrogen receptor positive status [ER+]: Secondary | ICD-10-CM | POA: Diagnosis not present

## 2024-01-21 DIAGNOSIS — Z1721 Progesterone receptor positive status: Secondary | ICD-10-CM | POA: Diagnosis not present

## 2024-01-21 DIAGNOSIS — Z1732 Human epidermal growth factor receptor 2 negative status: Secondary | ICD-10-CM | POA: Diagnosis not present

## 2024-01-21 DIAGNOSIS — C50312 Malignant neoplasm of lower-inner quadrant of left female breast: Secondary | ICD-10-CM

## 2024-01-21 DIAGNOSIS — Z79899 Other long term (current) drug therapy: Secondary | ICD-10-CM | POA: Insufficient documentation

## 2024-01-21 DIAGNOSIS — Z5111 Encounter for antineoplastic chemotherapy: Secondary | ICD-10-CM | POA: Insufficient documentation

## 2024-01-21 DIAGNOSIS — Z95828 Presence of other vascular implants and grafts: Secondary | ICD-10-CM

## 2024-01-21 LAB — CMP (CANCER CENTER ONLY)
ALT: 29 U/L (ref 0–44)
AST: 27 U/L (ref 15–41)
Albumin: 4.2 g/dL (ref 3.5–5.0)
Alkaline Phosphatase: 96 U/L (ref 38–126)
Anion gap: 6 (ref 5–15)
BUN: 11 mg/dL (ref 6–20)
CO2: 28 mmol/L (ref 22–32)
Calcium: 9.7 mg/dL (ref 8.9–10.3)
Chloride: 106 mmol/L (ref 98–111)
Creatinine: 0.73 mg/dL (ref 0.44–1.00)
GFR, Estimated: >60 mL/min (ref >60)
Glucose, Bld: 116 mg/dL — ABNORMAL HIGH (ref 70–99)
Potassium: 4 mmol/L (ref 3.5–5.1)
Sodium: 140 mmol/L (ref 135–145)
Total Bilirubin: 0.3 mg/dL (ref 0.0–1.2)
Total Protein: 7.1 g/dL (ref 6.5–8.1)

## 2024-01-21 LAB — CBC WITH DIFFERENTIAL (CANCER CENTER ONLY)
Abs Immature Granulocytes: 0.05 10*3/uL (ref 0.00–0.07)
Basophils Absolute: 0.1 10*3/uL (ref 0.0–0.1)
Basophils Relative: 1 %
Eosinophils Absolute: 0.3 10*3/uL (ref 0.0–0.5)
Eosinophils Relative: 4 %
HCT: 31.3 % — ABNORMAL LOW (ref 36.0–46.0)
Hemoglobin: 10.5 g/dL — ABNORMAL LOW (ref 12.0–15.0)
Immature Granulocytes: 1 %
Lymphocytes Relative: 19 %
Lymphs Abs: 1.2 10*3/uL (ref 0.7–4.0)
MCH: 31.5 pg (ref 26.0–34.0)
MCHC: 33.5 g/dL (ref 30.0–36.0)
MCV: 94 fL (ref 80.0–100.0)
Monocytes Absolute: 0.4 10*3/uL (ref 0.1–1.0)
Monocytes Relative: 6 %
Neutro Abs: 4.4 10*3/uL (ref 1.7–7.7)
Neutrophils Relative %: 69 %
Platelet Count: 318 10*3/uL (ref 150–400)
RBC: 3.33 MIL/uL — ABNORMAL LOW (ref 3.87–5.11)
RDW: 14.6 % (ref 11.5–15.5)
WBC Count: 6.3 10*3/uL (ref 4.0–10.5)
nRBC: 0 % (ref 0.0–0.2)

## 2024-01-21 LAB — PREGNANCY, URINE: Preg Test, Ur: NEGATIVE

## 2024-01-21 MED ORDER — FAMOTIDINE IN NACL 20-0.9 MG/50ML-% IV SOLN
20.0000 mg | Freq: Once | INTRAVENOUS | Status: AC
  Administered 2024-01-21: 20 mg via INTRAVENOUS
  Filled 2024-01-21: qty 50

## 2024-01-21 MED ORDER — SODIUM CHLORIDE 0.9 % IV SOLN: INTRAVENOUS | Status: DC

## 2024-01-21 MED ORDER — SODIUM CHLORIDE 0.9% FLUSH
10.0000 mL | INTRAVENOUS | Status: DC | PRN
  Administered 2024-01-21: 10 mL

## 2024-01-21 MED ORDER — HEPARIN SOD (PORK) LOCK FLUSH 100 UNIT/ML IV SOLN
500.0000 [IU] | Freq: Once | INTRAVENOUS | Status: AC | PRN
  Administered 2024-01-21: 500 [IU]

## 2024-01-21 MED ORDER — DEXAMETHASONE SODIUM PHOSPHATE 10 MG/ML IJ SOLN
10.0000 mg | Freq: Once | INTRAMUSCULAR | Status: AC
  Administered 2024-01-21: 10 mg via INTRAVENOUS
  Filled 2024-01-21: qty 1

## 2024-01-21 MED ORDER — SODIUM CHLORIDE 0.9% FLUSH
10.0000 mL | Freq: Once | INTRAVENOUS | Status: AC
  Administered 2024-01-21: 10 mL

## 2024-01-21 MED ORDER — CETIRIZINE HCL 10 MG/ML IV SOLN
10.0000 mg | Freq: Once | INTRAVENOUS | Status: AC
  Administered 2024-01-21: 10 mg via INTRAVENOUS
  Filled 2024-01-21: qty 1

## 2024-01-21 MED ORDER — SODIUM CHLORIDE 0.9 % IV SOLN
80.0000 mg/m2 | Freq: Once | INTRAVENOUS | Status: AC
  Administered 2024-01-21: 144 mg via INTRAVENOUS
  Filled 2024-01-21: qty 24

## 2024-01-21 NOTE — Patient Instructions (Signed)

## 2024-01-28 ENCOUNTER — Inpatient Hospital Stay

## 2024-01-28 ENCOUNTER — Inpatient Hospital Stay (HOSPITAL_BASED_OUTPATIENT_CLINIC_OR_DEPARTMENT_OTHER): Admitting: Hematology and Oncology

## 2024-01-28 VITALS — Wt 165.8 lb

## 2024-01-28 VITALS — BP 112/73 | HR 75 | Temp 97.9°F | Resp 18 | Ht 66.0 in | Wt 116.4 lb

## 2024-01-28 DIAGNOSIS — Z170 Estrogen receptor positive status [ER+]: Secondary | ICD-10-CM | POA: Diagnosis not present

## 2024-01-28 DIAGNOSIS — C50312 Malignant neoplasm of lower-inner quadrant of left female breast: Secondary | ICD-10-CM | POA: Diagnosis not present

## 2024-01-28 DIAGNOSIS — Z5111 Encounter for antineoplastic chemotherapy: Secondary | ICD-10-CM | POA: Diagnosis not present

## 2024-01-28 LAB — CMP (CANCER CENTER ONLY)
ALT: 22 U/L (ref 0–44)
AST: 20 U/L (ref 15–41)
Albumin: 4.1 g/dL (ref 3.5–5.0)
Alkaline Phosphatase: 84 U/L (ref 38–126)
Anion gap: 4 — ABNORMAL LOW (ref 5–15)
BUN: 10 mg/dL (ref 6–20)
CO2: 27 mmol/L (ref 22–32)
Calcium: 9.7 mg/dL (ref 8.9–10.3)
Chloride: 109 mmol/L (ref 98–111)
Creatinine: 0.69 mg/dL (ref 0.44–1.00)
GFR, Estimated: >60 mL/min (ref >60)
Glucose, Bld: 89 mg/dL (ref 70–99)
Potassium: 4.3 mmol/L (ref 3.5–5.1)
Sodium: 140 mmol/L (ref 135–145)
Total Bilirubin: 0.3 mg/dL (ref 0.0–1.2)
Total Protein: 6.7 g/dL (ref 6.5–8.1)

## 2024-01-28 LAB — CBC WITH DIFFERENTIAL (CANCER CENTER ONLY)
Abs Immature Granulocytes: 0.05 10*3/uL (ref 0.00–0.07)
Basophils Absolute: 0.1 10*3/uL (ref 0.0–0.1)
Basophils Relative: 2 %
Eosinophils Absolute: 0.2 10*3/uL (ref 0.0–0.5)
Eosinophils Relative: 4 %
HCT: 31.6 % — ABNORMAL LOW (ref 36.0–46.0)
Hemoglobin: 10.6 g/dL — ABNORMAL LOW (ref 12.0–15.0)
Immature Granulocytes: 1 %
Lymphocytes Relative: 24 %
Lymphs Abs: 1.2 10*3/uL (ref 0.7–4.0)
MCH: 31.6 pg (ref 26.0–34.0)
MCHC: 33.5 g/dL (ref 30.0–36.0)
MCV: 94.3 fL (ref 80.0–100.0)
Monocytes Absolute: 0.3 10*3/uL (ref 0.1–1.0)
Monocytes Relative: 6 %
Neutro Abs: 3.3 10*3/uL (ref 1.7–7.7)
Neutrophils Relative %: 63 %
Platelet Count: 280 10*3/uL (ref 150–400)
RBC: 3.35 MIL/uL — ABNORMAL LOW (ref 3.87–5.11)
RDW: 14.1 % (ref 11.5–15.5)
WBC Count: 5.2 10*3/uL (ref 4.0–10.5)
nRBC: 0 % (ref 0.0–0.2)

## 2024-01-28 MED ORDER — SODIUM CHLORIDE 0.9% FLUSH
10.0000 mL | INTRAVENOUS | Status: DC | PRN
  Administered 2024-01-28: 10 mL

## 2024-01-28 MED ORDER — SODIUM CHLORIDE 0.9 % IV SOLN: INTRAVENOUS | Status: DC

## 2024-01-28 MED ORDER — DEXAMETHASONE SODIUM PHOSPHATE 10 MG/ML IJ SOLN
10.0000 mg | Freq: Once | INTRAMUSCULAR | Status: AC
  Administered 2024-01-28: 10 mg via INTRAVENOUS
  Filled 2024-01-28: qty 1

## 2024-01-28 MED ORDER — SODIUM CHLORIDE 0.9 % IV SOLN
80.0000 mg/m2 | Freq: Once | INTRAVENOUS | Status: AC
  Administered 2024-01-28: 144 mg via INTRAVENOUS
  Filled 2024-01-28: qty 24

## 2024-01-28 MED ORDER — CETIRIZINE HCL 10 MG/ML IV SOLN
10.0000 mg | Freq: Once | INTRAVENOUS | Status: AC
  Administered 2024-01-28: 10 mg via INTRAVENOUS
  Filled 2024-01-28: qty 1

## 2024-01-28 MED ORDER — LIDOCAINE-PRILOCAINE 2.5-2.5 % EX CREA: TOPICAL_CREAM | CUTANEOUS | 0 refills | Status: DC

## 2024-01-28 MED ORDER — ESTRADIOL 0.1 MG/GM VA CREA: 1.0000 | TOPICAL_CREAM | Freq: Every day | VAGINAL | 12 refills | Status: AC

## 2024-01-28 MED ORDER — HEPARIN SOD (PORK) LOCK FLUSH 100 UNIT/ML IV SOLN
500.0000 [IU] | Freq: Once | INTRAVENOUS | Status: AC | PRN
  Administered 2024-01-28: 500 [IU]

## 2024-01-28 MED ORDER — FAMOTIDINE IN NACL 20-0.9 MG/50ML-% IV SOLN
20.0000 mg | Freq: Once | INTRAVENOUS | Status: AC
  Administered 2024-01-28: 20 mg via INTRAVENOUS
  Filled 2024-01-28: qty 50

## 2024-01-28 NOTE — Progress Notes (Signed)
 Patient Care Team: [name], PA-C as PCP - General (Family Medicine) Alane Hsu, RN as Oncology Nurse Navigator Auther Bo, RN as Oncology Nurse Navigator Enid Harry, MD as Consulting Physician (General Surgery) Cameron Cea, MD as Consulting Physician (Hematology and Oncology) Johna Myers, MD as Consulting Physician (Radiation Oncology)  DIAGNOSIS:  Encounter Diagnosis  Name Primary?   Malignant neoplasm of lower-inner quadrant of left breast in female, estrogen receptor positive (HCC) Yes    SUMMARY OF ONCOLOGIC HISTORY: Oncology History  Malignant neoplasm of lower-inner quadrant of left breast in female, estrogen receptor positive (HCC)  08/20/2023 Initial Diagnosis   Screening mammogram detected left breast distortion at 7 o'clock position measuring 1.2 cm.  Extra lymph node biopsy benign, biopsy of the breast: Grade 2 IDC ER 90%, PR 90%, HER2 negative by FISH, Ki-67 5%   08/26/2023 Cancer Staging   Staging form: Breast, AJCC 8th Edition - Clinical stage from 08/26/2023: Stage IA (cT1c, cN0(f), cM0, G2, ER+, PR+, HER2-) - Signed by Cameron Cea, MD on 08/28/2023 Stage prefix: Initial diagnosis Method of lymph node assessment: Core biopsy Histologic grading system: 3 grade system   09/05/2023 Genetic Testing   Negative [facility] CancerNext-Expanded +RNAinsight Panel.  Report date is 09/05/2023.   The CancerNext-Expanded gene panel offered by [facility] and includes sequencing, rearrangement, and RNA analysis for the following 76 genes: AIP, ALK, APC, ATM, AXIN2, BAP1, BARD1, BMPR1A, BRCA1, BRCA2, BRIP1, CDC73, CDH1, CDK4, CDKN1B, CDKN2A, CEBPA, CHEK2, CTNNA1, DDX41, DICER1, ETV6, FH, FLCN, GATA2, LZTR1, MAX, MBD4, MEN1, MET, MLH1, MSH2, MSH3, MSH6, MUTYH, NF1, NF2, NTHL1, PALB2, PHOX2B, PMS2, POT1, PRKAR1A, PTCH1, PTEN, RAD51C, RAD51D, RB1, RET, RUNX1, SDHA, SDHAF2, SDHB, SDHC, SDHD, SMAD4, SMARCA4, SMARCB1, SMARCE1, STK11, SUFU, TMEM127, TP53, TSC1,  TSC2, VHL, and WT1 (sequencing and deletion/duplication); EGFR, HOXB13, KIT, MITF, PDGFRA, POLD1, and POLE (sequencing only); EPCAM and GREM1 (deletion/duplication only).    09/17/2023 Surgery    Left lumpectomy: Grade 1 IDC 2 cm with intermediate grade DCIS, margins negative, LVI identified, ER 90%, PR 90%, HER2 negative by FISH, Ki-67 5%, 1/5 lymph nodes positive    09/17/2023 Cancer Staging   Staging form: Breast, AJCC 8th Edition - Pathologic stage from 09/17/2023: Stage IA (pT1c, pN1a, cM0, G1, ER+, PR+, HER2-) - Signed by Percival Brace, NP on 12/31/2023 Stage prefix: Initial diagnosis Histologic grading system: 3 grade system   10/15/2023 -  Chemotherapy   Patient is on Treatment Plan : BREAST DOSE DENSE AC q14d / PACLitaxel  q7d       CHIEF COMPLIANT: Cycle 8 Taxol   HISTORY OF PRESENT ILLNESS:  History of Present Illness Alicia Roach "Alicia Roach" is a 48 year old female undergoing chemotherapy for breast cancer who presents for follow-up.  She is undergoing treatment with Taxol  and has developed nail bed changes and dry eyes. She experiences dyspareunia, likely due to chemotherapy-induced estrogen suppression, and is concerned about its impact on her life. Her appetite and taste remain unaffected.  She requests a refill of lidocaine  cream for her port. She inquires about the safety of receiving a body massage, expressing concern about lymphedema risk.  She continues to take Effexor at a dose of 225 mg.     ALLERGIES:  is allergic to methimazole.  MEDICATIONS:  Current Outpatient Medications  Medication Sig Dispense Refill   Collagen Hydrolysate POWD 2 Scoops by Does not apply route.     levothyroxine  (SYNTHROID ) 125 MCG tablet Take 125 mcg by mouth daily before breakfast.  lidocaine -prilocaine  (EMLA ) cream Apply to affected area once 30 g 3   medium chain triglycerides (MCT OIL) oil Take by mouth 3 (three) times daily.     venlafaxine XR (EFFEXOR-XR) 150 MG 24 hr  capsule Take 150 mg by mouth daily.     venlafaxine XR (EFFEXOR-XR) 75 MG 24 hr capsule Take 75 mg by mouth daily.     ondansetron  (ZOFRAN ) 8 MG tablet Take 1 tab (8 mg) by mouth every 8 hrs as needed for nausea/vomiting. Start third day after doxorubicin /cyclophosphamide  chemotherapy. (Patient not taking: Reported on 01/28/2024) 30 tablet 1   prochlorperazine  (COMPAZINE ) 10 MG tablet Take 1 tablet (10 mg total) by mouth every 6 (six) hours as needed for nausea or vomiting. (Patient not taking: Reported on 01/28/2024) 30 tablet 1   VRAYLAR 1.5 MG capsule Take 1.5 mg by mouth every other day.     No current facility-administered medications for this visit.    PHYSICAL EXAMINATION: ECOG PERFORMANCE STATUS: 1 - Symptomatic but completely ambulatory  Vitals:   01/28/24 1011  BP: 120/80  Pulse: 99  Resp: 18  Temp: 97.9 F (36.6 C)  SpO2: 100%   Filed Weights   01/28/24 1011  Weight: 166 lb 12.8 oz (75.7 kg)    Physical Exam   (exam performed in the presence of a chaperone)  LABORATORY DATA:  I have reviewed the data as listed    Latest Ref Rng & Units 01/21/2024   10:36 AM 01/14/2024   11:05 AM 01/07/2024   10:59 AM  CMP  Glucose 70 - 99 mg/dL 478  93  86   BUN 6 - 20 mg/dL 11  13  13    Creatinine 0.44 - 1.00 mg/dL 2.95  6.21  3.08   Sodium 135 - 145 mmol/L 140  139  138   Potassium 3.5 - 5.1 mmol/L 4.0  4.2  4.0   Chloride 98 - 111 mmol/L 106  104  107   CO2 22 - 32 mmol/L 28  27  26    Calcium 8.9 - 10.3 mg/dL 9.7  65.7  9.2   Total Protein 6.5 - 8.1 g/dL 7.1  7.4  6.8   Total Bilirubin 0.0 - 1.2 mg/dL 0.3  0.4  0.4   Alkaline Phos 38 - 126 U/L 96  104  100   AST 15 - 41 U/L 27  21  14    ALT 0 - 44 U/L 29  21  15      Lab Results  Component Value Date   WBC 5.2 01/28/2024   HGB 10.6 (L) 01/28/2024   HCT 31.6 (L) 01/28/2024   MCV 94.3 01/28/2024   PLT 280 01/28/2024   NEUTROABS 3.3 01/28/2024    ASSESSMENT & PLAN:  Malignant neoplasm of lower-inner quadrant of left  breast in female, estrogen receptor positive (HCC) 08/20/2023:Screening mammogram detected left breast distortion at 7 o'clock position measuring 1.2 cm.  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 Adjuvant radiation therapy Adjuvant antiestrogen therapy with ovarian function suppression and anastrozole, along with Verzenio --------------------------------------------------------------------------------------------------------------------- Current treatment: Completed 4 cycles of dose dense Adriamycin  and  Cytoxan , today cycle 8 Taxol  Chemo toxicities: Mild fatigue Mild anemia: Monitoring closely today's hemoglobin is 10.6 Vaginal dryness: Sent a prescription for Estrace cream.  She will use it twice a week.  Return to clinic weekly for chemotherapy and every other week for follow-up with me I sent a message to Dr. Delane Fear to remove her port after the last cycle of chemo.     ------------------------------------- Assessment and Plan Assessment & Plan Estrogen suppression due to chemotherapy Estrogen suppression from chemotherapy is causing dyspareunia. Estrogen levels are expected to remain low during the antiestrogen phase. Recent studies confirm that topical estrogen cream is safe, non-systemic, and does not elevate breast cancer risk. - Prescribe estrogen cream for intravaginal use twice weekly. - Prescribe lidocaine  cream for port site application.  Dry eyes due to chemotherapy Chemotherapy, specifically Taxol , is causing xerophthalmia, a common side effect consistent with her treatment regimen.  Lymphedema risk  management She expressed concerns about lymphedema risk related to body massage. Massage is permissible with caution to avoid the port site and deep tissue techniques. - Advise her to inform the massage therapist to avoid the port site. - Recommend avoiding deep tissue massage.      No orders of the defined types were placed in this encounter.  The patient has a good understanding of the overall plan. she agrees with it. she will call with any problems that may develop before the next visit here. Total time spent: 30 mins including face to face time and time spent for planning, charting and co-ordination of care   Viinay K Lamount Bankson, MD 01/28/24

## 2024-01-28 NOTE — Assessment & Plan Note (Signed)
 08/20/2023:Screening mammogram detected left breast distortion at 7 o'clock position measuring 1.2 cm.  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 Adjuvant radiation therapy Adjuvant antiestrogen therapy with ovarian function suppression and anastrozole, along with Verzenio --------------------------------------------------------------------------------------------------------------------- Current treatment: Completed 4 cycles of dose dense Adriamycin  and Cytoxan , today cycle 8 Taxol  Chemo toxicities: Mild fatigue Mild anemia: Monitoring closely today's hemoglobin is 9.9  Return to clinic weekly for chemotherapy and every other week for follow-up with me

## 2024-02-04 ENCOUNTER — Inpatient Hospital Stay

## 2024-02-04 VITALS — BP 107/74 | HR 95 | Temp 98.2°F | Resp 20 | Wt 165.0 lb

## 2024-02-04 DIAGNOSIS — Z5111 Encounter for antineoplastic chemotherapy: Secondary | ICD-10-CM | POA: Diagnosis not present

## 2024-02-04 DIAGNOSIS — Z95828 Presence of other vascular implants and grafts: Secondary | ICD-10-CM

## 2024-02-04 DIAGNOSIS — Z170 Estrogen receptor positive status [ER+]: Secondary | ICD-10-CM

## 2024-02-04 LAB — CBC WITH DIFFERENTIAL (CANCER CENTER ONLY)
Abs Immature Granulocytes: 0.02 10*3/uL (ref 0.00–0.07)
Basophils Absolute: 0.1 10*3/uL (ref 0.0–0.1)
Basophils Relative: 2 %
Eosinophils Absolute: 0.2 10*3/uL (ref 0.0–0.5)
Eosinophils Relative: 3 %
HCT: 34.5 % — ABNORMAL LOW (ref 36.0–46.0)
Hemoglobin: 11.9 g/dL — ABNORMAL LOW (ref 12.0–15.0)
Immature Granulocytes: 0 %
Lymphocytes Relative: 27 %
Lymphs Abs: 1.3 10*3/uL (ref 0.7–4.0)
MCH: 32.2 pg (ref 26.0–34.0)
MCHC: 34.5 g/dL (ref 30.0–36.0)
MCV: 93.5 fL (ref 80.0–100.0)
Monocytes Absolute: 0.4 10*3/uL (ref 0.1–1.0)
Monocytes Relative: 9 %
Neutro Abs: 2.7 10*3/uL (ref 1.7–7.7)
Neutrophils Relative %: 59 %
Platelet Count: 291 10*3/uL (ref 150–400)
RBC: 3.69 MIL/uL — ABNORMAL LOW (ref 3.87–5.11)
RDW: 14 % (ref 11.5–15.5)
WBC Count: 4.7 10*3/uL (ref 4.0–10.5)
nRBC: 0 % (ref 0.0–0.2)

## 2024-02-04 LAB — CMP (CANCER CENTER ONLY)
ALT: 54 U/L — ABNORMAL HIGH (ref 0–44)
AST: 32 U/L (ref 15–41)
Albumin: 4.3 g/dL (ref 3.5–5.0)
Alkaline Phosphatase: 85 U/L (ref 38–126)
Anion gap: 6 (ref 5–15)
BUN: 11 mg/dL (ref 6–20)
CO2: 27 mmol/L (ref 22–32)
Calcium: 9.7 mg/dL (ref 8.9–10.3)
Chloride: 105 mmol/L (ref 98–111)
Creatinine: 0.69 mg/dL (ref 0.44–1.00)
GFR, Estimated: >60 mL/min (ref >60)
Glucose, Bld: 86 mg/dL (ref 70–99)
Potassium: 4.3 mmol/L (ref 3.5–5.1)
Sodium: 138 mmol/L (ref 135–145)
Total Bilirubin: 0.5 mg/dL (ref 0.0–1.2)
Total Protein: 7.1 g/dL (ref 6.5–8.1)

## 2024-02-04 MED ORDER — SODIUM CHLORIDE 0.9 % IV SOLN
80.0000 mg/m2 | Freq: Once | INTRAVENOUS | Status: AC
  Administered 2024-02-04: 144 mg via INTRAVENOUS
  Filled 2024-02-04: qty 24

## 2024-02-04 MED ORDER — FAMOTIDINE IN NACL 20-0.9 MG/50ML-% IV SOLN
20.0000 mg | Freq: Once | INTRAVENOUS | Status: AC
  Administered 2024-02-04: 20 mg via INTRAVENOUS
  Filled 2024-02-04: qty 50

## 2024-02-04 MED ORDER — CETIRIZINE HCL 10 MG/ML IV SOLN
10.0000 mg | Freq: Once | INTRAVENOUS | Status: AC
  Administered 2024-02-04: 10 mg via INTRAVENOUS
  Filled 2024-02-04: qty 1

## 2024-02-04 MED ORDER — SODIUM CHLORIDE 0.9% FLUSH
10.0000 mL | Freq: Once | INTRAVENOUS | Status: AC
  Administered 2024-02-04: 10 mL

## 2024-02-04 MED ORDER — SODIUM CHLORIDE 0.9 % IV SOLN: INTRAVENOUS | Status: DC

## 2024-02-04 MED ORDER — DEXAMETHASONE SODIUM PHOSPHATE 10 MG/ML IJ SOLN
10.0000 mg | Freq: Once | INTRAMUSCULAR | Status: AC
  Administered 2024-02-04: 10 mg via INTRAVENOUS
  Filled 2024-02-04: qty 1

## 2024-02-04 NOTE — Patient Instructions (Signed)
 [facility] - A DEPT OF [facility]  Discharge Instructions: Thank you for choosing [facility] to provide your oncology and hematology care.   If you have a lab appointment with the Cancer Center, please go directly to the Cancer Center and check in at the registration area.   Wear comfortable clothing and clothing appropriate for easy access to any Portacath or PICC line.   We strive to give you quality time with your provider. You may need to reschedule your appointment if you arrive late (15 or more minutes).  Arriving late affects you and other patients whose appointments are after yours.  Also, if you miss three or more appointments without notifying the office, you may be dismissed from the clinic at the provider's discretion.      For prescription refill requests, have your pharmacy contact our office and allow 72 hours for refills to be completed.    Today you received the following chemotherapy and/or immunotherapy agents paclitaxel      To help prevent nausea and vomiting after your treatment, we encourage you to take your nausea medication as directed.  BELOW ARE SYMPTOMS THAT SHOULD BE REPORTED IMMEDIATELY: *FEVER GREATER THAN 100.4 F (38 C) OR HIGHER *CHILLS OR SWEATING *NAUSEA AND VOMITING THAT IS NOT CONTROLLED WITH YOUR NAUSEA MEDICATION *UNUSUAL SHORTNESS OF BREATH *UNUSUAL BRUISING OR BLEEDING *URINARY PROBLEMS (pain or burning when urinating, or frequent urination) *BOWEL PROBLEMS (unusual diarrhea, constipation, pain near the anus) TENDERNESS IN MOUTH AND THROAT WITH OR WITHOUT PRESENCE OF ULCERS (sore throat, sores in mouth, or a toothache) UNUSUAL RASH, SWELLING OR PAIN  UNUSUAL VAGINAL DISCHARGE OR ITCHING   Items with * indicate a potential emergency and should be followed up as soon as possible or go to the Emergency Department if any problems should occur.  Please show the CHEMOTHERAPY ALERT CARD or IMMUNOTHERAPY  ALERT CARD at check-in to the Emergency Department and triage nurse.  Should you have questions after your visit or need to cancel or reschedule your appointment, please contact [facility] - A DEPT OF [facility]  Dept: [phone]  and follow the prompts.  Office hours are 8:00 a.m. to 4:30 p.m. Monday - Friday. Please note that voicemails left after 4:00 p.m. may not be returned until the following business day.  We are closed weekends and major holidays. You have access to a nurse at all times for urgent questions. Please call the main number to the clinic Dept: [phone] and follow the prompts.   For any non-urgent questions, you may also contact your provider using MyChart. We now offer e-Visits for anyone 38 and older to request care online for non-urgent symptoms. For details visit [email or web address].   Also download the MyChart app! Go to the app store, search "MyChart", open the app, select [place], and log in with your MyChart username and password.

## 2024-02-11 ENCOUNTER — Inpatient Hospital Stay (HOSPITAL_BASED_OUTPATIENT_CLINIC_OR_DEPARTMENT_OTHER): Admitting: Hematology and Oncology

## 2024-02-11 ENCOUNTER — Inpatient Hospital Stay

## 2024-02-11 VITALS — BP 131/85 | HR 95 | Temp 97.6°F | Resp 17 | Wt 167.7 lb

## 2024-02-11 DIAGNOSIS — Z170 Estrogen receptor positive status [ER+]: Secondary | ICD-10-CM | POA: Diagnosis not present

## 2024-02-11 DIAGNOSIS — Z95828 Presence of other vascular implants and grafts: Secondary | ICD-10-CM

## 2024-02-11 DIAGNOSIS — Z5111 Encounter for antineoplastic chemotherapy: Secondary | ICD-10-CM | POA: Diagnosis not present

## 2024-02-11 DIAGNOSIS — C50312 Malignant neoplasm of lower-inner quadrant of left female breast: Secondary | ICD-10-CM

## 2024-02-11 LAB — CMP (CANCER CENTER ONLY)
ALT: 41 U/L (ref 0–44)
AST: 32 U/L (ref 15–41)
Albumin: 4.2 g/dL (ref 3.5–5.0)
Alkaline Phosphatase: 84 U/L (ref 38–126)
Anion gap: 4 — ABNORMAL LOW (ref 5–15)
BUN: 11 mg/dL (ref 6–20)
CO2: 27 mmol/L (ref 22–32)
Calcium: 9.8 mg/dL (ref 8.9–10.3)
Chloride: 107 mmol/L (ref 98–111)
Creatinine: 0.66 mg/dL (ref 0.44–1.00)
GFR, Estimated: >60 mL/min (ref >60)
Glucose, Bld: 93 mg/dL (ref 70–99)
Potassium: 4.4 mmol/L (ref 3.5–5.1)
Sodium: 138 mmol/L (ref 135–145)
Total Bilirubin: 0.3 mg/dL (ref 0.0–1.2)
Total Protein: 7 g/dL (ref 6.5–8.1)

## 2024-02-11 LAB — CBC WITH DIFFERENTIAL (CANCER CENTER ONLY)
Abs Immature Granulocytes: 0.04 10*3/uL (ref 0.00–0.07)
Basophils Absolute: 0.1 10*3/uL (ref 0.0–0.1)
Basophils Relative: 2 %
Eosinophils Absolute: 0.2 10*3/uL (ref 0.0–0.5)
Eosinophils Relative: 3 %
HCT: 34.6 % — ABNORMAL LOW (ref 36.0–46.0)
Hemoglobin: 11.7 g/dL — ABNORMAL LOW (ref 12.0–15.0)
Immature Granulocytes: 1 %
Lymphocytes Relative: 26 %
Lymphs Abs: 1.3 10*3/uL (ref 0.7–4.0)
MCH: 32 pg (ref 26.0–34.0)
MCHC: 33.8 g/dL (ref 30.0–36.0)
MCV: 94.5 fL (ref 80.0–100.0)
Monocytes Absolute: 0.3 10*3/uL (ref 0.1–1.0)
Monocytes Relative: 6 %
Neutro Abs: 3.2 10*3/uL (ref 1.7–7.7)
Neutrophils Relative %: 62 %
Platelet Count: 360 10*3/uL (ref 150–400)
RBC: 3.66 MIL/uL — ABNORMAL LOW (ref 3.87–5.11)
RDW: 13.5 % (ref 11.5–15.5)
WBC Count: 5.1 10*3/uL (ref 4.0–10.5)
nRBC: 0 % (ref 0.0–0.2)

## 2024-02-11 LAB — PREGNANCY, URINE: Preg Test, Ur: NEGATIVE

## 2024-02-11 MED ORDER — CETIRIZINE HCL 10 MG/ML IV SOLN
10.0000 mg | Freq: Once | INTRAVENOUS | Status: AC
  Administered 2024-02-11: 10 mg via INTRAVENOUS
  Filled 2024-02-11: qty 1

## 2024-02-11 MED ORDER — DEXAMETHASONE SODIUM PHOSPHATE 10 MG/ML IJ SOLN
10.0000 mg | Freq: Once | INTRAMUSCULAR | Status: AC
  Administered 2024-02-11: 10 mg via INTRAVENOUS
  Filled 2024-02-11: qty 1

## 2024-02-11 MED ORDER — SODIUM CHLORIDE 0.9% FLUSH
10.0000 mL | Freq: Once | INTRAVENOUS | Status: AC
  Administered 2024-02-11: 10 mL

## 2024-02-11 MED ORDER — HEPARIN SOD (PORK) LOCK FLUSH 100 UNIT/ML IV SOLN
500.0000 [IU] | Freq: Once | INTRAVENOUS | Status: AC | PRN
  Administered 2024-02-11: 500 [IU]

## 2024-02-11 MED ORDER — SODIUM CHLORIDE 0.9 % IV SOLN
80.0000 mg/m2 | Freq: Once | INTRAVENOUS | Status: AC
  Administered 2024-02-11: 144 mg via INTRAVENOUS
  Filled 2024-02-11: qty 24

## 2024-02-11 MED ORDER — FAMOTIDINE IN NACL 20-0.9 MG/50ML-% IV SOLN
20.0000 mg | Freq: Once | INTRAVENOUS | Status: AC
  Administered 2024-02-11: 20 mg via INTRAVENOUS
  Filled 2024-02-11: qty 50

## 2024-02-11 MED ORDER — SODIUM CHLORIDE 0.9% FLUSH
10.0000 mL | INTRAVENOUS | Status: DC | PRN
Start: 2024-02-11 — End: 2024-02-11
  Administered 2024-02-11: 10 mL

## 2024-02-11 MED ORDER — SODIUM CHLORIDE 0.9 % IV SOLN: INTRAVENOUS | Status: DC

## 2024-02-11 NOTE — Progress Notes (Signed)
 Patient Care Team: Fabio Norberta Duncans, PA-C as PCP - General (Family Medicine) Tyree Nanetta SAILOR, RN as Oncology Nurse Navigator Glean, Stephane BROCKS, RN (Inactive) as Oncology Nurse Navigator Ebbie Cough, MD as Consulting Physician (General Surgery) Odean Potts, MD as Consulting Physician (Hematology and Oncology) Dewey Rush, MD as Consulting Physician (Radiation Oncology)  DIAGNOSIS:  Encounter Diagnosis  Name Primary?   Malignant neoplasm of lower-inner quadrant of left breast in female, estrogen receptor positive (HCC) Yes    SUMMARY OF ONCOLOGIC HISTORY: Oncology History  Malignant neoplasm of lower-inner quadrant of left breast in female, estrogen receptor positive (HCC)  08/20/2023 Initial Diagnosis   Screening mammogram detected left breast distortion at 7 o'clock position measuring 1.2 cm.  Extra lymph node biopsy benign, biopsy of the breast: Grade 2 IDC ER 90%, PR 90%, HER2 negative by FISH, Ki-67 5%   08/26/2023 Cancer Staging   Staging form: Breast, AJCC 8th Edition - Clinical stage from 08/26/2023: Stage IA (cT1c, cN0(f), cM0, G2, ER+, PR+, HER2-) - Signed by Odean Potts, MD on 08/28/2023 Stage prefix: Initial diagnosis Method of lymph node assessment: Core biopsy Histologic grading system: 3 grade system   09/05/2023 Genetic Testing   Negative [facility] CancerNext-Expanded +RNAinsight Panel.  Report date is 09/05/2023.   The CancerNext-Expanded gene panel offered by [facility] and includes sequencing, rearrangement, and RNA analysis for the following 76 genes: AIP, ALK, APC, ATM, AXIN2, BAP1, BARD1, BMPR1A, BRCA1, BRCA2, BRIP1, CDC73, CDH1, CDK4, CDKN1B, CDKN2A, CEBPA, CHEK2, CTNNA1, DDX41, DICER1, ETV6, FH, FLCN, GATA2, LZTR1, MAX, MBD4, MEN1, MET, MLH1, MSH2, MSH3, MSH6, MUTYH, NF1, NF2, NTHL1, PALB2, PHOX2B, PMS2, POT1, PRKAR1A, PTCH1, PTEN, RAD51C, RAD51D, RB1, RET, RUNX1, SDHA, SDHAF2, SDHB, SDHC, SDHD, SMAD4, SMARCA4, SMARCB1, SMARCE1, STK11, SUFU, TMEM127, TP53,  TSC1, TSC2, VHL, and WT1 (sequencing and deletion/duplication); EGFR, HOXB13, KIT, MITF, PDGFRA, POLD1, and POLE (sequencing only); EPCAM and GREM1 (deletion/duplication only).    09/17/2023 Surgery    Left lumpectomy: Grade 1 IDC 2 cm with intermediate grade DCIS, margins negative, LVI identified, ER 90%, PR 90%, HER2 negative by FISH, Ki-67 5%, 1/5 lymph nodes positive    09/17/2023 Cancer Staging   Staging form: Breast, AJCC 8th Edition - Pathologic stage from 09/17/2023: Stage IA (pT1c, pN1a, cM0, G1, ER+, PR+, HER2-) - Signed by Crawford Morna Pickle, NP on 12/31/2023 Stage prefix: Initial diagnosis Histologic grading system: 3 grade system   10/15/2023 -  Chemotherapy   Patient is on Treatment Plan : BREAST DOSE DENSE AC q14d / PACLitaxel  q7d       CHIEF COMPLIANT: Cycle 10 Taxol   HISTORY OF PRESENT ILLNESS:  History of Present Illness Alicia Roach is a 48 year old female undergoing chemotherapy for breast cancer who presents for follow-up.  She maintains adequate energy levels for daily activities, though heat limits outdoor activities. Hemoglobin is 11.7, white blood cell count is 5.1, and platelet levels are stable. Dryness symptoms have improved with treatment changes. She is eager to complete chemotherapy, with the last session on July 8th. She inquires about post-treatment monitoring, including mammograms and blood work for recurrence detection. No new symptoms affecting daily activities are present.     ALLERGIES:  is allergic to methimazole.  MEDICATIONS:  Current Outpatient Medications  Medication Sig Dispense Refill   Collagen Hydrolysate POWD 2 Scoops by Does not apply route.     estradiol  (ESTRACE  VAGINAL) 0.1 MG/GM vaginal cream Place 1 Applicatorful vaginally at bedtime. 42.5 g 12   levothyroxine  (SYNTHROID ) 125 MCG tablet  Take 125 mcg by mouth daily before breakfast.     lidocaine -prilocaine  (EMLA ) cream Apply to affected area once 30 g 0   medium chain  triglycerides (MCT OIL) oil Take by mouth 3 (three) times daily.     venlafaxine XR (EFFEXOR-XR) 150 MG 24 hr capsule Take 150 mg by mouth daily.     venlafaxine XR (EFFEXOR-XR) 75 MG 24 hr capsule Take 75 mg by mouth daily.     VRAYLAR 1.5 MG capsule Take 1.5 mg by mouth every other day.     No current facility-administered medications for this visit.   Facility-Administered Medications Ordered in Other Visits  Medication Dose Route Frequency Provider Last Rate Last Admin   0.9 %  sodium chloride  infusion   Intravenous Continuous Odean Potts, MD 10 mL/hr at 02/11/24 1222 New Bag at 02/11/24 1222   sodium chloride  flush (NS) 0.9 % injection 10 mL  10 mL Intracatheter PRN Odean Potts, MD   10 mL at 02/11/24 1434    PHYSICAL EXAMINATION: ECOG PERFORMANCE STATUS: 1 - Symptomatic but completely ambulatory  Vitals:   02/11/24 1137 02/11/24 1138  BP: (!) 149/84 131/85  Pulse: 95   Resp: 17   Temp: 97.6 F (36.4 C)   SpO2: 99%    Filed Weights   02/11/24 1137  Weight: 167 lb 11.2 oz (76.1 kg)      LABORATORY DATA:  I have reviewed the data as listed    Latest Ref Rng & Units 02/11/2024   11:13 AM 02/04/2024   11:05 AM 01/28/2024    9:47 AM  CMP  Glucose 70 - 99 mg/dL 93  86  89   BUN 6 - 20 mg/dL 11  11  10    Creatinine 0.44 - 1.00 mg/dL 9.33  9.30  9.30   Sodium 135 - 145 mmol/L 138  138  140   Potassium 3.5 - 5.1 mmol/L 4.4  4.3  4.3   Chloride 98 - 111 mmol/L 107  105  109   CO2 22 - 32 mmol/L 27  27  27    Calcium 8.9 - 10.3 mg/dL 9.8  9.7  9.7   Total Protein 6.5 - 8.1 g/dL 7.0  7.1  6.7   Total Bilirubin 0.0 - 1.2 mg/dL 0.3  0.5  0.3   Alkaline Phos 38 - 126 U/L 84  85  84   AST 15 - 41 U/L 32  32  20   ALT 0 - 44 U/L 41  54  22     Lab Results  Component Value Date   WBC 5.1 02/11/2024   HGB 11.7 (L) 02/11/2024   HCT 34.6 (L) 02/11/2024   MCV 94.5 02/11/2024   PLT 360 02/11/2024   NEUTROABS 3.2 02/11/2024    ASSESSMENT & PLAN:  Malignant neoplasm of  lower-inner quadrant of left breast in female, estrogen receptor positive (HCC) 08/20/2023:Screening mammogram detected left breast distortion at 7 o'clock position measuring 1.2 cm.  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 Adjuvant radiation therapy Adjuvant antiestrogen therapy with ovarian function suppression and anastrozole, along with Verzenio --------------------------------------------------------------------------------------------------------------------- Current  treatment: Completed 4 cycles of dose dense Adriamycin  and Cytoxan , today cycle 10 Taxol  Chemo toxicities: Mild fatigue Mild anemia: Monitoring closely today's hemoglobin is 10.6 Vaginal dryness: Sent a prescription for Estrace  cream.  She will use it twice a week.   Return to clinic weekly for chemotherapy and every other week for follow-up with me I sent a message to Dr. Ebbie to remove her port after the last cycle of chemo.   ------------------------------------- Assessment and Plan Assessment & Plan Malignant neoplasm of lower-inner quadrant of left breast Near completion of chemotherapy for estrogen receptor-positive breast cancer. Hemoglobin improved to 11.7. WBC and platelets stable. Radiation therapy planned post-chemotherapy. Post-treatment monitoring with GARDENT test for recurrence detection. - Confirm end of chemotherapy and coordinate radiation therapy start with radiation oncology team. - Ensure appointment with radiation team on February 09, 2024, for radiation schedule discussion and preliminary imaging. - Initiate GARDENT blood test regimen  post-treatment for recurrence detection. - Plan meeting with nurse practitioner for survivorship discussion, including activity and weight goals, and mammogram surveillance setup. - Discuss hormone therapy options, including tamoxifen, post-radiation therapy.  Estrogen suppression due to chemotherapy Experiencing estrogen suppression with tolerable symptoms. Hormone therapy expected to mimic current suppression effects, excluding fatigue and appetite changes. - Discuss hormone therapy options, including tamoxifen, post-radiation therapy.  Anemia due to chemotherapy Anemia improving with hemoglobin at 11.7.  Dry eyes due to chemotherapy Improvement in dry eyes with recent management changes.      No orders of the defined types were placed in this encounter.  The patient has a good understanding of the overall plan. she agrees with it. she will call with any problems that may develop before the next visit here. Total time spent: 30 mins including face to face time and time spent for planning, charting and co-ordination of care   Naomi MARLA Chad, MD 02/11/24

## 2024-02-11 NOTE — Assessment & Plan Note (Signed)
 08/20/2023:Screening mammogram detected left breast distortion at 7 o'clock position measuring 1.2 cm.  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 Adjuvant radiation therapy Adjuvant antiestrogen therapy with ovarian function suppression and anastrozole, along with Verzenio --------------------------------------------------------------------------------------------------------------------- Current treatment: Completed 4 cycles of dose dense Adriamycin  and Cytoxan , today cycle 10 Taxol  Chemo toxicities: Mild fatigue Mild anemia: Monitoring closely today's hemoglobin is 10.6 Vaginal dryness: Sent a prescription for Estrace  cream.  She will use it twice a week.   Return to clinic weekly for chemotherapy and every other week for follow-up with me I sent a message to Dr. Ebbie to remove her port after the last cycle of chemo.

## 2024-02-11 NOTE — Patient Instructions (Signed)

## 2024-02-18 ENCOUNTER — Inpatient Hospital Stay

## 2024-02-18 ENCOUNTER — Inpatient Hospital Stay: Attending: Hematology and Oncology

## 2024-02-18 VITALS — BP 128/78 | HR 88 | Temp 98.2°F | Resp 18 | Wt 169.5 lb

## 2024-02-18 DIAGNOSIS — Z1721 Progesterone receptor positive status: Secondary | ICD-10-CM | POA: Insufficient documentation

## 2024-02-18 DIAGNOSIS — C50312 Malignant neoplasm of lower-inner quadrant of left female breast: Secondary | ICD-10-CM | POA: Insufficient documentation

## 2024-02-18 DIAGNOSIS — Z1732 Human epidermal growth factor receptor 2 negative status: Secondary | ICD-10-CM | POA: Diagnosis not present

## 2024-02-18 DIAGNOSIS — T451X5A Adverse effect of antineoplastic and immunosuppressive drugs, initial encounter: Secondary | ICD-10-CM | POA: Diagnosis not present

## 2024-02-18 DIAGNOSIS — D6481 Anemia due to antineoplastic chemotherapy: Secondary | ICD-10-CM | POA: Insufficient documentation

## 2024-02-18 DIAGNOSIS — Z170 Estrogen receptor positive status [ER+]: Secondary | ICD-10-CM | POA: Diagnosis not present

## 2024-02-18 DIAGNOSIS — Z79899 Other long term (current) drug therapy: Secondary | ICD-10-CM | POA: Diagnosis not present

## 2024-02-18 DIAGNOSIS — Z95828 Presence of other vascular implants and grafts: Secondary | ICD-10-CM

## 2024-02-18 DIAGNOSIS — Z5111 Encounter for antineoplastic chemotherapy: Secondary | ICD-10-CM | POA: Insufficient documentation

## 2024-02-18 LAB — CMP (CANCER CENTER ONLY)
ALT: 23 U/L (ref 0–44)
AST: 20 U/L (ref 15–41)
Albumin: 4.3 g/dL (ref 3.5–5.0)
Alkaline Phosphatase: 84 U/L (ref 38–126)
Anion gap: 5 (ref 5–15)
BUN: 11 mg/dL (ref 6–20)
CO2: 26 mmol/L (ref 22–32)
Calcium: 9.8 mg/dL (ref 8.9–10.3)
Chloride: 109 mmol/L (ref 98–111)
Creatinine: 0.67 mg/dL (ref 0.44–1.00)
GFR, Estimated: >60 mL/min (ref >60)
Glucose, Bld: 92 mg/dL (ref 70–99)
Potassium: 4.1 mmol/L (ref 3.5–5.1)
Sodium: 140 mmol/L (ref 135–145)
Total Bilirubin: 0.4 mg/dL (ref 0.0–1.2)
Total Protein: 6.9 g/dL (ref 6.5–8.1)

## 2024-02-18 LAB — CBC WITH DIFFERENTIAL (CANCER CENTER ONLY)
Abs Immature Granulocytes: 0.03 10*3/uL (ref 0.00–0.07)
Basophils Absolute: 0.1 10*3/uL (ref 0.0–0.1)
Basophils Relative: 2 %
Eosinophils Absolute: 0.2 10*3/uL (ref 0.0–0.5)
Eosinophils Relative: 3 %
HCT: 35.4 % — ABNORMAL LOW (ref 36.0–46.0)
Hemoglobin: 12 g/dL (ref 12.0–15.0)
Immature Granulocytes: 1 %
Lymphocytes Relative: 23 %
Lymphs Abs: 1.2 10*3/uL (ref 0.7–4.0)
MCH: 31.8 pg (ref 26.0–34.0)
MCHC: 33.9 g/dL (ref 30.0–36.0)
MCV: 93.9 fL (ref 80.0–100.0)
Monocytes Absolute: 0.4 10*3/uL (ref 0.1–1.0)
Monocytes Relative: 8 %
Neutro Abs: 3.3 10*3/uL (ref 1.7–7.7)
Neutrophils Relative %: 63 %
Platelet Count: 313 10*3/uL (ref 150–400)
RBC: 3.77 MIL/uL — ABNORMAL LOW (ref 3.87–5.11)
RDW: 13.2 % (ref 11.5–15.5)
WBC Count: 5.1 10*3/uL (ref 4.0–10.5)
nRBC: 0 % (ref 0.0–0.2)

## 2024-02-18 MED ORDER — DEXAMETHASONE SODIUM PHOSPHATE 10 MG/ML IJ SOLN
10.0000 mg | Freq: Once | INTRAMUSCULAR | Status: AC
  Administered 2024-02-18: 10 mg via INTRAVENOUS

## 2024-02-18 MED ORDER — FAMOTIDINE IN NACL 20-0.9 MG/50ML-% IV SOLN
20.0000 mg | Freq: Once | INTRAVENOUS | Status: AC
  Administered 2024-02-18: 20 mg via INTRAVENOUS

## 2024-02-18 MED ORDER — CETIRIZINE HCL 10 MG/ML IV SOLN
10.0000 mg | Freq: Once | INTRAVENOUS | Status: AC
  Administered 2024-02-18: 10 mg via INTRAVENOUS

## 2024-02-18 MED ORDER — SODIUM CHLORIDE 0.9 % IV SOLN
INTRAVENOUS | Status: DC
Start: 2024-02-18 — End: 2024-02-18

## 2024-02-18 MED ORDER — SODIUM CHLORIDE 0.9% FLUSH
10.0000 mL | Freq: Once | INTRAVENOUS | Status: AC
  Administered 2024-02-18: 10 mL

## 2024-02-18 MED ORDER — SODIUM CHLORIDE 0.9 % IV SOLN
80.0000 mg/m2 | Freq: Once | INTRAVENOUS | Status: AC
  Administered 2024-02-18: 144 mg via INTRAVENOUS
  Filled 2024-02-18: qty 24

## 2024-02-18 NOTE — Patient Instructions (Signed)

## 2024-02-24 NOTE — Assessment & Plan Note (Signed)
 08/20/2023:Screening mammogram detected left breast distortion at 7 o'clock position measuring 1.2 cm.  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 Adjuvant radiation therapy Adjuvant antiestrogen therapy with ovarian function suppression and anastrozole, along with Verzenio --------------------------------------------------------------------------------------------------------------------- Current treatment: Completed 4 cycles of dose dense Adriamycin  and Cytoxan , today cycle 12 Taxol  Chemo toxicities: Mild fatigue Mild anemia: Monitoring closely today's hemoglobin is 10.6 Vaginal dryness: Sent a prescription for Estrace  cream.  She will use it twice a week.   RTC after radiation is complete

## 2024-02-25 ENCOUNTER — Inpatient Hospital Stay (HOSPITAL_BASED_OUTPATIENT_CLINIC_OR_DEPARTMENT_OTHER): Admitting: Hematology and Oncology

## 2024-02-25 ENCOUNTER — Inpatient Hospital Stay

## 2024-02-25 ENCOUNTER — Telehealth: Payer: Self-pay

## 2024-02-25 VITALS — HR 100

## 2024-02-25 VITALS — BP 139/87 | HR 109 | Temp 97.3°F | Resp 18 | Ht 66.0 in | Wt 169.7 lb

## 2024-02-25 DIAGNOSIS — Z170 Estrogen receptor positive status [ER+]: Secondary | ICD-10-CM

## 2024-02-25 DIAGNOSIS — Z5111 Encounter for antineoplastic chemotherapy: Secondary | ICD-10-CM | POA: Diagnosis not present

## 2024-02-25 DIAGNOSIS — C50312 Malignant neoplasm of lower-inner quadrant of left female breast: Secondary | ICD-10-CM | POA: Diagnosis not present

## 2024-02-25 DIAGNOSIS — Z95828 Presence of other vascular implants and grafts: Secondary | ICD-10-CM

## 2024-02-25 LAB — CBC WITH DIFFERENTIAL (CANCER CENTER ONLY)
Abs Immature Granulocytes: 0.04 K/uL (ref 0.00–0.07)
Basophils Absolute: 0.1 K/uL (ref 0.0–0.1)
Basophils Relative: 2 %
Eosinophils Absolute: 0.2 K/uL (ref 0.0–0.5)
Eosinophils Relative: 3 %
HCT: 36.9 % (ref 36.0–46.0)
Hemoglobin: 12.3 g/dL (ref 12.0–15.0)
Immature Granulocytes: 1 %
Lymphocytes Relative: 24 %
Lymphs Abs: 1.3 K/uL (ref 0.7–4.0)
MCH: 31.6 pg (ref 26.0–34.0)
MCHC: 33.3 g/dL (ref 30.0–36.0)
MCV: 94.9 fL (ref 80.0–100.0)
Monocytes Absolute: 0.4 K/uL (ref 0.1–1.0)
Monocytes Relative: 8 %
Neutro Abs: 3.4 K/uL (ref 1.7–7.7)
Neutrophils Relative %: 62 %
Platelet Count: 296 K/uL (ref 150–400)
RBC: 3.89 MIL/uL (ref 3.87–5.11)
RDW: 13.3 % (ref 11.5–15.5)
WBC Count: 5.4 K/uL (ref 4.0–10.5)
nRBC: 0 % (ref 0.0–0.2)

## 2024-02-25 LAB — CMP (CANCER CENTER ONLY)
ALT: 20 U/L (ref 0–44)
AST: 18 U/L (ref 15–41)
Albumin: 4.1 g/dL (ref 3.5–5.0)
Alkaline Phosphatase: 76 U/L (ref 38–126)
Anion gap: 4 — ABNORMAL LOW (ref 5–15)
BUN: 13 mg/dL (ref 6–20)
CO2: 28 mmol/L (ref 22–32)
Calcium: 9.8 mg/dL (ref 8.9–10.3)
Chloride: 109 mmol/L (ref 98–111)
Creatinine: 0.72 mg/dL (ref 0.44–1.00)
GFR, Estimated: >60 mL/min (ref >60)
Glucose, Bld: 78 mg/dL (ref 70–99)
Potassium: 4.3 mmol/L (ref 3.5–5.1)
Sodium: 141 mmol/L (ref 135–145)
Total Bilirubin: 0.3 mg/dL (ref 0.0–1.2)
Total Protein: 6.5 g/dL (ref 6.5–8.1)

## 2024-02-25 LAB — PREGNANCY, URINE: Preg Test, Ur: NEGATIVE

## 2024-02-25 MED ORDER — SODIUM CHLORIDE 0.9 % IV SOLN: INTRAVENOUS | Status: DC

## 2024-02-25 MED ORDER — FAMOTIDINE IN NACL 20-0.9 MG/50ML-% IV SOLN
20.0000 mg | Freq: Once | INTRAVENOUS | Status: AC
  Administered 2024-02-25: 20 mg via INTRAVENOUS
  Filled 2024-02-25: qty 50

## 2024-02-25 MED ORDER — SODIUM CHLORIDE 0.9% FLUSH
10.0000 mL | INTRAVENOUS | Status: DC | PRN
  Administered 2024-02-25: 10 mL

## 2024-02-25 MED ORDER — DEXAMETHASONE SODIUM PHOSPHATE 10 MG/ML IJ SOLN
10.0000 mg | Freq: Once | INTRAMUSCULAR | Status: AC
  Administered 2024-02-25: 10 mg via INTRAVENOUS
  Filled 2024-02-25: qty 1

## 2024-02-25 MED ORDER — HEPARIN SOD (PORK) LOCK FLUSH 100 UNIT/ML IV SOLN
500.0000 [IU] | Freq: Once | INTRAVENOUS | Status: AC | PRN
  Administered 2024-02-25: 500 [IU]

## 2024-02-25 MED ORDER — CETIRIZINE HCL 10 MG/ML IV SOLN
10.0000 mg | Freq: Once | INTRAVENOUS | Status: AC
  Administered 2024-02-25: 10 mg via INTRAVENOUS
  Filled 2024-02-25: qty 1

## 2024-02-25 MED ORDER — SODIUM CHLORIDE 0.9 % IV SOLN
80.0000 mg/m2 | Freq: Once | INTRAVENOUS | Status: AC
  Administered 2024-02-25: 144 mg via INTRAVENOUS
  Filled 2024-02-25: qty 24

## 2024-02-25 MED ORDER — SODIUM CHLORIDE 0.9% FLUSH
10.0000 mL | Freq: Once | INTRAVENOUS | Status: AC
Start: 2024-02-25 — End: 2024-02-25
  Administered 2024-02-25: 10 mL

## 2024-02-25 NOTE — Telephone Encounter (Signed)
 LVM for pr regarding her return to work form being faxed over and confirmation received. No questions or concerns to be noted at this time.

## 2024-02-25 NOTE — Progress Notes (Signed)
 Location of Breast Cancer: Left Breast  Histology per Pathology Report:  FINAL MICROSCOPIC DIAGNOSIS: 09/17/2023 A. 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 LYMPH NODE, LEFT AXILLARY, SENTINEL, EXCISION:      One lymph node, positive for metastatic carcinoma (1/1).      Size of metastatic focus: 4 mm.      No extranodal extension identified.  C. LYMPH NODE, LEFT AXILLARY, SENTINEL, EXCISION:      One lymph node, negative for metastatic carcinoma (0/1).  D. LYMPH NODE, LEFT AXILLARY, SENTINEL, EXCISION:      One lymph node, negative for metastatic carcinoma (0/1).  E. LYMPH NODE, LEFT AXILLARY, SENTINEL, EXCISION:      One lymph node, negative for metastatic carcinoma (0/1).  F. LYMPH NODE, LEFT AXILLARY, SENTINEL, EXCISION: One lymph node, negative for metastatic carcinoma (0/1).  G. BREAST, LEFT MEDIAL MARGIN, EXCISION:      Benign breast tissue with usual ductal hyperplasia and apocrine cysts.      Negative for atypia or malignancy.  H. BREAST, LEFT SUPERIOR MARGIN, EXCISION:      Benign breast tissue with usual ductal hyperplasia, adenosis and cysts.      Negative for atypia or malignancy.  I. BREAST, LEFT POSTERIOR MARGIN, EXCISION:      Benign breast tissue and skeletal muscle.      Negative for atypia or  malignancy.   Receptor Status: ER(Positive), PR (Positive), Her2-neu (Negative), Ki-67: 5%   Did patient present with symptoms (if so, please note symptoms) or was this found on screening mammography?:  08/20/2023 Initial Diagnosis    Screening mammogram detected left breast distortion at 7 o'clock position measuring 1.2 cm.  Extra lymph node biopsy benign, biopsy of the breast: Grade 2 IDC ER 90%, PR 90%, HER2 negative by FISH, Ki-67 5%    Past/Anticipated interventions by surgeon, if any: Alicia Cough, MD 09/17/2023 LEFT BREAST SEED GUIDED LUMPECTOMY, LEFT AXILLARY SENTINEL NODE BIOPSY   Past/Anticipated interventions by medical oncology, if any: Dr. Gudena 02/25/2024    Chemotherapy 10/15/2023- Patient is on Treatment Plan : BREAST DOSE DENSE AC q14d / PACLitaxel  q7d   Lymphedema issues, if any:  None   Pain issues, if any:  No   Skin issues if any: None  SAFETY ISSUES: Prior radiation? No Pacemaker/ICD? No Possible current pregnancy? No- Negative HCG on 02/25/2024- LMP Unknown due to chemotherapy. Is the patient on methotrexate? No  Current Complaints / other details:  None  Vitals: BP (!) 132/99 (BP Location: Right Arm, Patient Position: Sitting, Cuff Size: Normal)   Pulse (!) 108   Temp (!) 97.5 F (36.4 C) (Temporal)   Resp 18   Ht 5' 6 (1.676 m)   Wt 172 lb 9.6 oz (78.3 kg)   SpO2 99%   BMI 27.86 kg/m  This concludes the interaction.  Alicia Minerva, LPN

## 2024-02-25 NOTE — Patient Instructions (Signed)

## 2024-02-25 NOTE — Progress Notes (Signed)
 Patient Care Team: Fabio Norberta Duncans, PA-C as PCP - General (Family Medicine) Tyree Nanetta SAILOR, RN as Oncology Nurse Navigator Glean, Stephane BROCKS, RN (Inactive) as Oncology Nurse Navigator Ebbie Cough, MD as Consulting Physician (General Surgery) Odean Potts, MD as Consulting Physician (Hematology and Oncology) Dewey Rush, MD as Consulting Physician (Radiation Oncology)  DIAGNOSIS:  Encounter Diagnosis  Name Primary?   Malignant neoplasm of lower-inner quadrant of left breast in female, estrogen receptor positive (HCC) Yes    SUMMARY OF ONCOLOGIC HISTORY: Oncology History  Malignant neoplasm of lower-inner quadrant of left breast in female, estrogen receptor positive (HCC)  08/20/2023 Initial Diagnosis   Screening mammogram detected left breast distortion at 7 o'clock position measuring 1.2 cm.  Extra lymph node biopsy benign, biopsy of the breast: Grade 2 IDC ER 90%, PR 90%, HER2 negative by FISH, Ki-67 5%   08/26/2023 Cancer Staging   Staging form: Breast, AJCC 8th Edition - Clinical stage from 08/26/2023: Stage IA (cT1c, cN0(f), cM0, G2, ER+, PR+, HER2-) - Signed by Odean Potts, MD on 08/28/2023 Stage prefix: Initial diagnosis Method of lymph node assessment: Core biopsy Histologic grading system: 3 grade system   09/05/2023 Genetic Testing   Negative [facility] CancerNext-Expanded +RNAinsight Panel.  Report date is 09/05/2023.   The CancerNext-Expanded gene panel offered by [facility] and includes sequencing, rearrangement, and RNA analysis for the following 76 genes: AIP, ALK, APC, ATM, AXIN2, BAP1, BARD1, BMPR1A, BRCA1, BRCA2, BRIP1, CDC73, CDH1, CDK4, CDKN1B, CDKN2A, CEBPA, CHEK2, CTNNA1, DDX41, DICER1, ETV6, FH, FLCN, GATA2, LZTR1, MAX, MBD4, MEN1, MET, MLH1, MSH2, MSH3, MSH6, MUTYH, NF1, NF2, NTHL1, PALB2, PHOX2B, PMS2, POT1, PRKAR1A, PTCH1, PTEN, RAD51C, RAD51D, RB1, RET, RUNX1, SDHA, SDHAF2, SDHB, SDHC, SDHD, SMAD4, SMARCA4, SMARCB1, SMARCE1, STK11, SUFU, TMEM127, TP53,  TSC1, TSC2, VHL, and WT1 (sequencing and deletion/duplication); EGFR, HOXB13, KIT, MITF, PDGFRA, POLD1, and POLE (sequencing only); EPCAM and GREM1 (deletion/duplication only).    09/17/2023 Surgery    Left lumpectomy: Grade 1 IDC 2 cm with intermediate grade DCIS, margins negative, LVI identified, ER 90%, PR 90%, HER2 negative by FISH, Ki-67 5%, 1/5 lymph nodes positive    09/17/2023 Cancer Staging   Staging form: Breast, AJCC 8th Edition - Pathologic stage from 09/17/2023: Stage IA (pT1c, pN1a, cM0, G1, ER+, PR+, HER2-) - Signed by Crawford Morna Pickle, NP on 12/31/2023 Stage prefix: Initial diagnosis Histologic grading system: 3 grade system   10/15/2023 -  Chemotherapy   Patient is on Treatment Plan : BREAST DOSE DENSE AC q14d / PACLitaxel  q7d       CHIEF COMPLIANT: Cycle 12 Taxol   HISTORY OF PRESENT ILLNESS:  History of Present Illness Alicia Roach is a 48 year old female who presents for follow-up to receive her final chemotherapy treatment with Taxol .  She feels well with no gastrointestinal symptoms. Her hemoglobin is 12.3, and her white blood cell and platelet counts are stable, indicating recovery from chemotherapy-induced anemia. She has an upcoming imaging appointment and port removal scheduled for July 22nd. She uses a compression sleeve for flying due to lymphedema risk. She is considering returning to work with no anticipated restrictions.     ALLERGIES:  is allergic to methimazole.  MEDICATIONS:  Current Outpatient Medications  Medication Sig Dispense Refill   Collagen Hydrolysate POWD 2 Scoops by Does not apply route.     estradiol  (ESTRACE  VAGINAL) 0.1 MG/GM vaginal cream Place 1 Applicatorful vaginally at bedtime. 42.5 g 12   levothyroxine  (SYNTHROID ) 125 MCG tablet Take 125 mcg by mouth  daily before breakfast.     lidocaine -prilocaine  (EMLA ) cream Apply to affected area once 30 g 0   medium chain triglycerides (MCT OIL) oil Take by mouth 3 (three) times  daily.     venlafaxine XR (EFFEXOR-XR) 150 MG 24 hr capsule Take 150 mg by mouth daily.     venlafaxine XR (EFFEXOR-XR) 75 MG 24 hr capsule Take 75 mg by mouth daily.     VRAYLAR 1.5 MG capsule Take 1.5 mg by mouth every other day.     No current facility-administered medications for this visit.    PHYSICAL EXAMINATION: ECOG PERFORMANCE STATUS: 1 - Symptomatic but completely ambulatory  Vitals:   02/25/24 1016  BP: 139/87  Pulse: (!) 109  Resp: 18  Temp: (!) 97.3 F (36.3 C)  SpO2: 100%   Filed Weights   02/25/24 1016  Weight: 169 lb 11.2 oz (77 kg)    LABORATORY DATA:  I have reviewed the data as listed    Latest Ref Rng & Units 02/18/2024   10:22 AM 02/11/2024   11:13 AM 02/04/2024   11:05 AM  CMP  Glucose 70 - 99 mg/dL 92  93  86   BUN 6 - 20 mg/dL 11  11  11    Creatinine 0.44 - 1.00 mg/dL 9.32  9.33  9.30   Sodium 135 - 145 mmol/L 140  138  138   Potassium 3.5 - 5.1 mmol/L 4.1  4.4  4.3   Chloride 98 - 111 mmol/L 109  107  105   CO2 22 - 32 mmol/L 26  27  27    Calcium 8.9 - 10.3 mg/dL 9.8  9.8  9.7   Total Protein 6.5 - 8.1 g/dL 6.9  7.0  7.1   Total Bilirubin 0.0 - 1.2 mg/dL 0.4  0.3  0.5   Alkaline Phos 38 - 126 U/L 84  84  85   AST 15 - 41 U/L 20  32  32   ALT 0 - 44 U/L 23  41  54     Lab Results  Component Value Date   WBC 5.4 02/25/2024   HGB 12.3 02/25/2024   HCT 36.9 02/25/2024   MCV 94.9 02/25/2024   PLT 296 02/25/2024   NEUTROABS 3.4 02/25/2024    ASSESSMENT & PLAN:  Malignant neoplasm of lower-inner quadrant of left breast in female, estrogen receptor positive (HCC) 08/20/2023:Screening mammogram detected left breast distortion at 7 o'clock position measuring 1.2 cm.  Extra lymph node biopsy benign, biopsy of the breast: Grade 2 IDC ER 90%, PR 90%, HER2 negative by FISH, Ki-67 5%  09/17/2023: Left lumpectomy: Grade 1 IDC 2 cm with intermediate grade DCIS, margins negative, LVI identified, ER 90%, PR 90%, HER2 negative by FISH, Ki-67 5%, 1/5 lymph  nodes positive   Treatment plan: Adjuvant chemotherapy with dose dense Adriamycin  and Cytoxan  x 4 followed by Taxol  weekly x 12. Adjuvant radiation therapy Adjuvant antiestrogen therapy with ovarian function suppression and anastrozole, along with Verzenio --------------------------------------------------------------------------------------------------------------------- Current treatment: Completed 4 cycles of dose dense Adriamycin  and Cytoxan , today cycle 12 Taxol  Chemo toxicities: Mild fatigue Mild anemia: Monitoring closely today's hemoglobin is 10.6 Vaginal dryness: Sent a prescription for Estrace  cream.  She will use it twice a week.   RTC after radiation is complete  Assessment & Plan Malignant neoplasm of breast Completed chemotherapy for estrogen receptor-positive malignant neoplasm of the lower-inner quadrant of the left breast. Hemoglobin is 12.3, indicating recovery from chemotherapy-induced anemia. White blood cell count and  platelets are well-managed. Scheduled for radiation therapy and port removal. - Schedule follow-up appointment after radiation therapy to discuss hormone therapy options.      No orders of the defined types were placed in this encounter.  The patient has a good understanding of the overall plan. she agrees with it. she will call with any problems that may develop before the next visit here. Total time spent: 30 mins including face to face time and time spent for planning, charting and co-ordination of care   Naomi MARLA Chad, MD 02/25/24

## 2024-03-09 ENCOUNTER — Encounter: Payer: Self-pay | Admitting: Hematology and Oncology

## 2024-03-09 NOTE — Progress Notes (Signed)
 Radiation Oncology         [phone] ________________________________  Name: Alicia Roach        MRN: [id number]  Date of Service: 03/10/2024 DOB: 28-Feb-1976  RR:Djryd, Norberta Duncans, PA-C  Odean Potts, MD     REFERRING PHYSICIAN: Odean Potts, MD   DIAGNOSIS: The encounter diagnosis was Malignant neoplasm of lower-inner quadrant of left breast in female, estrogen receptor positive (HCC).   HISTORY OF PRESENT ILLNESS: Alicia Roach is a 48 y.o. female originally seen in the multidisciplinary breast clinic for a new diagnosis of left breast cancer. The patient was noted to have a screening detected distortion in the left breast. She returned for diagnostic work up on 08/15/23 that showed a mass by ultrasound measuring 1.2 cm in the 7:00 position, and multiple prominent left axillary nodes. 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 The cancer was ER/PR positive, HER2 was negative, with a Ki 67 of 5%. Her axilla was also biopsied and was non diagnostic.   Since her last visit, she underwent left lumpectomy with sentinel node biopsy on 1/28/5 that showed a grade 1 invasive ductal carcinoma measuring 2 cm with associated intermediate grade DICS. The margins were negative for invasive disease, the closest 1.8 mm from the medial margin, and 2 mm from the anterior margin for DCIS. There were 5 lymph nodes sampled, and one had macrometastsis measuring 4 mm. Given her positive lymph node, she began chemotherapy on 10/15/23 and completed this on 02/25/24. She's seen to discuss radiotherapy to the breast and regional nodes.   PREVIOUS RADIATION THERAPY: No   PAST MEDICAL HISTORY:  Past Medical History:  Diagnosis Date   Cancer (HCC)    Depression    GAD (generalized anxiety disorder)    Dr. Elodie McKinney--sees her regularly   Hypothyroidism    s/p radioactive iodine ablation 2010    PONV (postoperative nausea and vomiting)     PONV during c section   Retropharyngeal abscess 08/21/1995       PAST SURGICAL HISTORY: Past Surgical History:  Procedure Laterality Date   BREAST BIOPSY Left 08/20/2023   US  LT BREAST BX W LOC DEV 1ST LESION IMG BX SPEC US  GUIDE 08/20/2023 [facility]   BREAST BIOPSY Left 09/16/2023   US  LT RADIOACTIVE SEED LOC 09/16/2023 [facility]   BREAST BIOPSY Left 09/16/2023   US  LT RADIOACTIVE SEED EA ADD LESION 09/16/2023 [facility]   BREAST LUMPECTOMY WITH RADIOACTIVE SEED AND SENTINEL LYMPH NODE BIOPSY Left 09/17/2023   Procedure: LEFT BREAST SEED GUIDED LUMPECTOMY, LEFT AXILLARY SENTINEL NODE BIOPSY;  Surgeon: Ebbie Cough, MD;  [facility];  Service: General;  Laterality: Left;  GEN PEC BLOCK   CESAREAN SECTION  10/11/2011   Procedure: CESAREAN SECTION;  Surgeon: Charlie CHRISTELLA Croak, MD;  Location: [facility] ORS;  Service: Gynecology;  Laterality: N/A;   digital reconstructive surgery (middle finger of L hand)     PORTACATH PLACEMENT Right 10/07/2023   Procedure: INSERTION PORT-A-CATH WITH ULTRASOUND GUIDANCE;  Surgeon: Ebbie Cough, MD;  Location: [facility];  Service: General;  Laterality: Right;   RADIOACTIVE SEED GUIDED AXILLARY SENTINEL LYMPH NODE Left 09/17/2023   Procedure: LEFT AXILLARY NODE SEED GUIDED EXCISION;  Surgeon: Ebbie Cough, MD;  Location: [facility] OR;  Service: General;  Laterality: Left;   TONSILLECTOMY  1999   WISDOM TOOTH EXTRACTION       FAMILY HISTORY:  Family History  Problem  Relation Age of Onset   Hyperthyroidism Mother    Depression Mother    Depression Sister    Prostate cancer Maternal Uncle        x2 mat uncles   Cancer Maternal Uncle        unknown; mets dx >50   Skin cancer Paternal Aunt    Prostate cancer Paternal Uncle    Prostate cancer Cousin        mat cousin; mets; reported BRCA1 pos   Colon cancer Cousin        mat female cousin; mets; reported BRCA1 pos   Ovarian cancer Cousin        mat  cousin; reported BRCA1 pos   Stomach cancer Cousin        mat cousin   Cancer Cousin        mat cousin; lung and kidney cancers     SOCIAL HISTORY:  reports that she quit smoking about 22 years ago. Her smoking use included cigarettes. She started smoking about 30 years ago. She has never used smokeless tobacco. She reports current alcohol use. She reports that she does not use drugs. The patient lives in [place]. She has an 69 year old son who is very active in soccer. 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 She has flexibility to work one day a week remotely. She desires her treatment to be give in [place].    ALLERGIES: Methimazole   MEDICATIONS:  Current Outpatient Medications  Medication Sig Dispense Refill   Collagen Hydrolysate POWD 2 Scoops by Does not apply route.     estradiol  (ESTRACE  VAGINAL) 0.1 MG/GM vaginal cream Place 1 Applicatorful vaginally at bedtime. 42.5 g 12   levothyroxine  (SYNTHROID ) 125 MCG tablet Take 125 mcg by mouth daily before breakfast.     lidocaine -prilocaine  (EMLA ) cream Apply to affected area once 30 g 0   medium chain triglycerides (MCT OIL) oil Take by mouth 3 (three) times daily.     venlafaxine XR (EFFEXOR-XR) 150 MG 24 hr capsule Take 150 mg by mouth daily.     venlafaxine XR (EFFEXOR-XR) 75 MG 24 hr capsule Take 75 mg by mouth daily.     VRAYLAR 1.5 MG capsule Take 1.5 mg by mouth every other day.     No current facility-administered medications for this visit.     REVIEW OF SYSTEMS: On review of systems, the patient reports that she is doing well since chemotherapy with mild neuropathy since completing treatment in her fingertips and some fatigue. No other complaints are verbalized.     PHYSICAL EXAM:  Wt Readings from Last 3 Encounters:  02/25/24 169 lb 11.2 oz (77 kg)  02/18/24 169 lb 8 oz (76.9 kg)  02/11/24 167 lb 11.2 oz (76.1 kg)   Temp Readings from Last 3 Encounters:   02/25/24 (!) 97.3 F (36.3 C) (Temporal)  02/18/24 98.2 F (36.8 C) (Oral)  02/11/24 97.6 F (36.4 C) (Temporal)   BP Readings from Last 3 Encounters:  02/25/24 139/87  02/18/24 128/78  02/11/24 131/85   Pulse Readings from Last 3 Encounters:  02/25/24 100  02/25/24 (!) 109  02/18/24 88    In general this is a well appearing caucasian female in no acute distress. She's alert and oriented x4 and appropriate throughout the examination. Cardiopulmonary assessment is negative for acute distress and she exhibits normal effort. The left breast has a well healed surgical incision as does the axilla. Neither site  have evidence of erythema, separation, or drainage.    ECOG = 1  0 - Asymptomatic (Fully active, able to carry on all predisease activities without restriction)  1 - Symptomatic but completely ambulatory (Restricted in physically strenuous activity but ambulatory and able to carry out work of a light or sedentary nature. For example, light housework, office work)  2 - Symptomatic, <50% in bed during the day (Ambulatory and capable of all self care but unable to carry out any work activities. Up and about more than 50% of waking hours)  3 - Symptomatic, >50% in bed, but not bedbound (Capable of only limited self-care, confined to bed or chair 50% or more of waking hours)  4 - Bedbound (Completely disabled. Cannot carry on any self-care. Totally confined to bed or chair)  5 - Death   Raylene MM, Creech RH, Tormey DC, et al. [phone]). Toxicity and response criteria of the [facility]. Am. DOROTHA Bridges. Oncol. 5 (6): 649-55    LABORATORY DATA:  Lab Results  Component Value Date   WBC 5.4 02/25/2024   HGB 12.3 02/25/2024   HCT 36.9 02/25/2024   MCV 94.9 02/25/2024   PLT 296 02/25/2024   Lab Results  Component Value Date   NA 141 02/25/2024   K 4.3 02/25/2024   CL 109 02/25/2024   CO2 28 02/25/2024   Lab Results  Component Value Date   ALT 20  02/25/2024   AST 18 02/25/2024   ALKPHOS 76 02/25/2024   BILITOT 0.3 02/25/2024      RADIOGRAPHY: No results found.      IMPRESSION/PLAN: 1. Stage IA, pT1cN1aM0, grade 1, ER/PR positive invasive ductal carcinoma of the left breast. Dr. Dewey has reviewed the patient's final pathology findings and she and I reviewed the nature of node positive breast disease. She has done well since surgery and since completing chemotherpay. Dr. Dewey recommends external radiotherapy to the breast and regional nodes to reduce risks of local recurrence. Dr. Odean anticipates adjuvant antiestrogen therapy to follow. We discussed the risks, benefits, short, and long term effects of radiotherapy, as well as the curative intent, and the patient is interested in proceeding. I discussed the delivery and logistics of radiotherapy and anticipates a course of 6 1/2 weeks of radiotherapy to the left breast and regional nodes with deep inspiration breath hold technique. Written consent is obtained and placed in the chart, a copy was provided to the patient. She will simulate today and start treatment the week of 03/23/24. 2. Contraceptive Counseling. The patient's partner has had a vasectomy so no pregnancy testing is planned.    In a visit lasting 45 minutes, greater than 50% of the time was spent face to face reviewing her case, as well as in preparation of, discussing, and coordinating the patient's care.      Donald KYM Husband, [facility]    **Disclaimer: This note was dictated with voice recognition software. Similar sounding words can inadvertently be transcribed and this note may contain transcription errors which may not have been corrected upon publication of note.**

## 2024-03-10 ENCOUNTER — Ambulatory Visit
Admission: RE | Admit: 2024-03-10 | Discharge: 2024-03-10 | Disposition: A | Discharge status: Home / Self Care | Source: Ambulatory Visit | Attending: Radiation Oncology | Admitting: Radiation Oncology

## 2024-03-10 ENCOUNTER — Encounter: Payer: Self-pay | Admitting: Radiation Oncology

## 2024-03-10 VITALS — BP 132/99 | HR 108 | Temp 97.5°F | Resp 18 | Ht 66.0 in | Wt 172.6 lb

## 2024-03-10 DIAGNOSIS — Z8041 Family history of malignant neoplasm of ovary: Secondary | ICD-10-CM | POA: Insufficient documentation

## 2024-03-10 DIAGNOSIS — Z87891 Personal history of nicotine dependence: Secondary | ICD-10-CM | POA: Insufficient documentation

## 2024-03-10 DIAGNOSIS — Z1732 Human epidermal growth factor receptor 2 negative status: Secondary | ICD-10-CM | POA: Insufficient documentation

## 2024-03-10 DIAGNOSIS — Z9221 Personal history of antineoplastic chemotherapy: Secondary | ICD-10-CM | POA: Insufficient documentation

## 2024-03-10 DIAGNOSIS — C50312 Malignant neoplasm of lower-inner quadrant of left female breast: Secondary | ICD-10-CM | POA: Diagnosis present

## 2024-03-10 DIAGNOSIS — Z79890 Hormone replacement therapy (postmenopausal): Secondary | ICD-10-CM | POA: Insufficient documentation

## 2024-03-10 DIAGNOSIS — C773 Secondary and unspecified malignant neoplasm of axilla and upper limb lymph nodes: Secondary | ICD-10-CM | POA: Insufficient documentation

## 2024-03-10 DIAGNOSIS — G629 Polyneuropathy, unspecified: Secondary | ICD-10-CM | POA: Insufficient documentation

## 2024-03-10 DIAGNOSIS — E039 Hypothyroidism, unspecified: Secondary | ICD-10-CM | POA: Insufficient documentation

## 2024-03-10 DIAGNOSIS — Z800 Family history of malignant neoplasm of digestive organs: Secondary | ICD-10-CM | POA: Insufficient documentation

## 2024-03-10 DIAGNOSIS — Z1721 Progesterone receptor positive status: Secondary | ICD-10-CM | POA: Insufficient documentation

## 2024-03-10 DIAGNOSIS — C779 Secondary and unspecified malignant neoplasm of lymph node, unspecified: Secondary | ICD-10-CM | POA: Diagnosis not present

## 2024-03-10 DIAGNOSIS — Z170 Estrogen receptor positive status [ER+]: Secondary | ICD-10-CM | POA: Insufficient documentation

## 2024-03-10 DIAGNOSIS — Z8042 Family history of malignant neoplasm of prostate: Secondary | ICD-10-CM | POA: Insufficient documentation

## 2024-03-10 DIAGNOSIS — Z809 Family history of malignant neoplasm, unspecified: Secondary | ICD-10-CM | POA: Diagnosis not present

## 2024-03-10 DIAGNOSIS — Z79899 Other long term (current) drug therapy: Secondary | ICD-10-CM | POA: Insufficient documentation

## 2024-03-11 ENCOUNTER — Other Ambulatory Visit: Payer: Self-pay

## 2024-03-16 ENCOUNTER — Ambulatory Visit: Attending: General Surgery

## 2024-03-16 ENCOUNTER — Encounter: Payer: Self-pay | Admitting: *Deleted

## 2024-03-16 VITALS — Wt 169.2 lb

## 2024-03-16 DIAGNOSIS — Z170 Estrogen receptor positive status [ER+]: Secondary | ICD-10-CM

## 2024-03-16 DIAGNOSIS — Z483 Aftercare following surgery for neoplasm: Secondary | ICD-10-CM | POA: Insufficient documentation

## 2024-03-16 NOTE — Therapy (Signed)
 OUTPATIENT PHYSICAL THERAPY SOZO SCREENING NOTE   Patient Name: VANESA RENIER MRN: [id number] DOB:[birth year], 48 y.o., female Today's Date: 03/16/2024  PCP: Fabio Norberta Duncans, PA-C REFERRING PROVIDER: Ebbie Cough, MD   PT End of Session - 03/16/24 [phone]     Visit Number 2   # unchanged due to screen only   PT Start Time 0831    PT Stop Time 0835    PT Time Calculation (min) 4 min    Activity Tolerance Patient tolerated treatment well    Behavior During Therapy [facility] for tasks assessed/performed          Past Medical History:  Diagnosis Date   Cancer (HCC)    Depression    GAD (generalized anxiety disorder)    Dr. 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   Past Surgical History:  Procedure Laterality Date   BREAST BIOPSY Left 08/20/2023   US  LT BREAST BX W LOC DEV 1ST LESION IMG BX SPEC US  GUIDE 08/20/2023 [facility]   BREAST BIOPSY Left 09/16/2023   US  LT RADIOACTIVE SEED LOC 09/16/2023 [facility]   BREAST BIOPSY Left 09/16/2023   US  LT RADIOACTIVE SEED EA ADD LESION 09/16/2023 [facility]MO[facility]   BREAST LUMPECTOMY WITH RADIOACTIVE SEED AND SENTINEL LYMPH NODE BIOPSY Left 09/17/2023   Procedure: LEFT BREAST SEED GUIDED LUMPECTOMY, LEFT AXILLARY SENTINEL NODE BIOPSY;  Surgeon: Ebbie Cough, MD;  Location: [facility] OR;  Service: General;  Laterality: Left;  GEN PEC BLOCK   CESAREAN SECTION  10/11/2011   Procedure: CESAREAN SECTION;  Surgeon: Charlie CHRISTELLA Croak, MD;  Location: [facility];  Service: Gynecology;  Laterality: N/A;   digital reconstructive surgery (middle finger of L hand)     PORTACATH PLACEMENT Right 10/07/2023   Procedure: INSERTION PORT-A-CATH WITH ULTRASOUND GUIDANCE;  Surgeon: Ebbie Cough, MD;  Location: [facility];  Service: General;  Laterality: Right;    RADIOACTIVE SEED GUIDED AXILLARY SENTINEL LYMPH NODE Left 09/17/2023   Procedure: LEFT AXILLARY NODE SEED GUIDED EXCISION;  Surgeon: Ebbie Cough, MD;  Location: [facility] OR;  Service: General;  Laterality: Left;   TONSILLECTOMY  1999   WISDOM TOOTH EXTRACTION     Patient Active Problem List   Diagnosis Date Noted   Port-A-Cath in place 10/15/2023   Genetic testing 09/06/2023   Malignant neoplasm of lower-inner quadrant of left breast in female, estrogen receptor positive (HCC) 08/26/2023   Acute low back pain 02/28/2014   Pharyngitis, acute 07/02/2012   Viral URI 04/23/2012   SINUSITIS - ACUTE-NOS 06/16/2010   ADVERSE DRUG REACTION 10/18/2009   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 She underwent a left lumpectomy and sentinel node biopsy on 09/17/2023 (1 of 5 nodes positive for carcinoma). It is ER/PR positive and HER2 negative with a Ki67 of 5%   PRECAUTIONS: left UE Lymphedema risk  SUBJECTIVE: Pt returns for her 3  month L-Dex screen.  I start radiation in Aug.   PAIN:  Are you having pain? No  SOZO SCREENING: Patient was assessed today using the SOZO machine to determine the lymphedema index score. This was compared to her  baseline score. It was determined that she is within the recommended range when compared to her baseline and no further action is needed at this time. She will continue SOZO screenings. These are done every 3 months for 2 years post operatively followed by every 6 months for 2 years, and then annually. Explained possible symptoms of breast lymphedema to be aware of after radiation complete.    L-DEX FLOWSHEETS - 03/16/24 0800       L-DEX LYMPHEDEMA SCREENING   Measurement Type Unilateral    L-DEX  MEASUREMENT EXTREMITY Upper Extremity    POSITION  Standing    DOMINANT SIDE Right    At Risk Side Left    BASELINE SCORE (UNILATERAL) 0.8    L-DEX SCORE (UNILATERAL) -1.2    VALUE CHANGE (UNILAT) -2           Aden Berwyn Caldron, PTA 03/16/2024, 8:36 AM

## 2024-03-17 ENCOUNTER — Other Ambulatory Visit: Payer: Self-pay

## 2024-03-20 ENCOUNTER — Ambulatory Visit: Admitting: Radiation Oncology

## 2024-03-20 ENCOUNTER — Ambulatory Visit
Admission: RE | Admit: 2024-03-20 | Discharge: 2024-03-20 | Disposition: A | Discharge status: Home / Self Care | Source: Ambulatory Visit | Attending: Radiation Oncology | Admitting: Radiation Oncology

## 2024-03-20 DIAGNOSIS — C50312 Malignant neoplasm of lower-inner quadrant of left female breast: Secondary | ICD-10-CM | POA: Diagnosis present

## 2024-03-20 DIAGNOSIS — Z170 Estrogen receptor positive status [ER+]: Secondary | ICD-10-CM | POA: Insufficient documentation

## 2024-03-23 ENCOUNTER — Other Ambulatory Visit: Payer: Self-pay

## 2024-03-23 DIAGNOSIS — C50312 Malignant neoplasm of lower-inner quadrant of left female breast: Secondary | ICD-10-CM | POA: Diagnosis not present

## 2024-03-23 LAB — RAD ONC ARIA SESSION SUMMARY

## 2024-03-24 ENCOUNTER — Other Ambulatory Visit: Payer: Self-pay

## 2024-03-24 ENCOUNTER — Ambulatory Visit
Admission: RE | Admit: 2024-03-24 | Discharge: 2024-03-24 | Disposition: A | Discharge status: Home / Self Care | Source: Ambulatory Visit | Attending: Radiation Oncology

## 2024-03-24 DIAGNOSIS — C50312 Malignant neoplasm of lower-inner quadrant of left female breast: Secondary | ICD-10-CM | POA: Diagnosis not present

## 2024-03-24 LAB — RAD ONC ARIA SESSION SUMMARY

## 2024-03-25 ENCOUNTER — Ambulatory Visit
Admission: RE | Admit: 2024-03-25 | Discharge: 2024-03-25 | Disposition: A | Discharge status: Home / Self Care | Source: Ambulatory Visit | Attending: Radiation Oncology | Admitting: Radiation Oncology

## 2024-03-25 ENCOUNTER — Other Ambulatory Visit: Payer: Self-pay

## 2024-03-25 DIAGNOSIS — C50312 Malignant neoplasm of lower-inner quadrant of left female breast: Secondary | ICD-10-CM | POA: Diagnosis not present

## 2024-03-25 LAB — RAD ONC ARIA SESSION SUMMARY

## 2024-03-26 ENCOUNTER — Ambulatory Visit
Admission: RE | Admit: 2024-03-26 | Discharge: 2024-03-26 | Disposition: A | Discharge status: Home / Self Care | Source: Ambulatory Visit | Attending: Radiation Oncology

## 2024-03-26 ENCOUNTER — Other Ambulatory Visit: Payer: Self-pay

## 2024-03-26 DIAGNOSIS — C50312 Malignant neoplasm of lower-inner quadrant of left female breast: Secondary | ICD-10-CM | POA: Diagnosis not present

## 2024-03-26 LAB — RAD ONC ARIA SESSION SUMMARY

## 2024-03-27 ENCOUNTER — Ambulatory Visit
Admission: RE | Admit: 2024-03-27 | Discharge: 2024-03-27 | Disposition: A | Discharge status: Home / Self Care | Source: Ambulatory Visit | Attending: Radiation Oncology | Admitting: Radiation Oncology

## 2024-03-27 ENCOUNTER — Other Ambulatory Visit: Payer: Self-pay

## 2024-03-27 DIAGNOSIS — C50312 Malignant neoplasm of lower-inner quadrant of left female breast: Secondary | ICD-10-CM | POA: Diagnosis not present

## 2024-03-27 LAB — RAD ONC ARIA SESSION SUMMARY
Course Elapsed Days: 4
Plan Fractions Treated to Date: 5
Plan Fractions Treated to Date: 5
Plan Prescribed Dose Per Fraction: 1.8 Gy
Plan Prescribed Dose Per Fraction: 1.8 Gy
Plan Total Fractions Prescribed: 28
Plan Total Fractions Prescribed: 28
Plan Total Prescribed Dose: 50.4 Gy
Plan Total Prescribed Dose: 50.4 Gy
Reference Point Dosage Given to Date: 9 Gy
Reference Point Dosage Given to Date: 9 Gy
Reference Point Session Dosage Given: 1.8 Gy
Reference Point Session Dosage Given: 1.8 Gy
Session Number: 5

## 2024-03-27 MED ORDER — ALRA NON-METALLIC DEODORANT (RAD-ONC)
1.0000 | Freq: Once | TOPICAL | Status: AC
  Administered 2024-03-27: 1 via TOPICAL

## 2024-03-27 MED ORDER — RADIAPLEXRX EX GEL
Freq: Once | CUTANEOUS | Status: AC
  Administered 2024-03-27: 1 via TOPICAL

## 2024-03-30 ENCOUNTER — Other Ambulatory Visit: Payer: Self-pay

## 2024-03-30 ENCOUNTER — Ambulatory Visit
Admission: RE | Admit: 2024-03-30 | Discharge: 2024-03-30 | Disposition: A | Discharge status: Home / Self Care | Source: Ambulatory Visit | Attending: Radiation Oncology

## 2024-03-30 DIAGNOSIS — C50312 Malignant neoplasm of lower-inner quadrant of left female breast: Secondary | ICD-10-CM | POA: Diagnosis not present

## 2024-03-30 LAB — RAD ONC ARIA SESSION SUMMARY

## 2024-03-31 ENCOUNTER — Other Ambulatory Visit: Payer: Self-pay

## 2024-03-31 ENCOUNTER — Ambulatory Visit
Admission: RE | Admit: 2024-03-31 | Discharge: 2024-03-31 | Disposition: A | Discharge status: Home / Self Care | Source: Ambulatory Visit | Attending: Radiation Oncology

## 2024-03-31 DIAGNOSIS — C50312 Malignant neoplasm of lower-inner quadrant of left female breast: Secondary | ICD-10-CM | POA: Diagnosis not present

## 2024-03-31 LAB — RAD ONC ARIA SESSION SUMMARY

## 2024-04-01 ENCOUNTER — Other Ambulatory Visit: Payer: Self-pay

## 2024-04-01 ENCOUNTER — Ambulatory Visit
Admission: RE | Admit: 2024-04-01 | Discharge: 2024-04-01 | Disposition: A | Discharge status: Home / Self Care | Source: Ambulatory Visit | Attending: Radiation Oncology | Admitting: Radiation Oncology

## 2024-04-01 DIAGNOSIS — C50312 Malignant neoplasm of lower-inner quadrant of left female breast: Secondary | ICD-10-CM | POA: Diagnosis not present

## 2024-04-01 LAB — RAD ONC ARIA SESSION SUMMARY

## 2024-04-02 ENCOUNTER — Other Ambulatory Visit: Payer: Self-pay

## 2024-04-02 ENCOUNTER — Ambulatory Visit
Admission: RE | Admit: 2024-04-02 | Discharge: 2024-04-02 | Disposition: A | Discharge status: Home / Self Care | Source: Ambulatory Visit | Attending: Radiation Oncology | Admitting: Radiation Oncology

## 2024-04-02 DIAGNOSIS — C50312 Malignant neoplasm of lower-inner quadrant of left female breast: Secondary | ICD-10-CM | POA: Diagnosis not present

## 2024-04-02 LAB — RAD ONC ARIA SESSION SUMMARY

## 2024-04-03 ENCOUNTER — Other Ambulatory Visit: Payer: Self-pay

## 2024-04-03 ENCOUNTER — Ambulatory Visit
Admission: RE | Admit: 2024-04-03 | Discharge: 2024-04-03 | Disposition: A | Discharge status: Home / Self Care | Source: Ambulatory Visit | Attending: Radiation Oncology | Admitting: Radiation Oncology

## 2024-04-03 DIAGNOSIS — C50312 Malignant neoplasm of lower-inner quadrant of left female breast: Secondary | ICD-10-CM | POA: Diagnosis not present

## 2024-04-03 LAB — RAD ONC ARIA SESSION SUMMARY
Course Elapsed Days: 11
Plan Fractions Treated to Date: 10
Plan Fractions Treated to Date: 10
Plan Prescribed Dose Per Fraction: 1.8 Gy
Plan Prescribed Dose Per Fraction: 1.8 Gy
Plan Total Fractions Prescribed: 28
Plan Total Fractions Prescribed: 28
Plan Total Prescribed Dose: 50.4 Gy
Plan Total Prescribed Dose: 50.4 Gy
Reference Point Dosage Given to Date: 18 Gy
Reference Point Dosage Given to Date: 18 Gy
Reference Point Session Dosage Given: 1.8 Gy
Reference Point Session Dosage Given: 1.8 Gy
Session Number: 10

## 2024-04-06 ENCOUNTER — Other Ambulatory Visit: Payer: Self-pay

## 2024-04-06 ENCOUNTER — Ambulatory Visit
Admission: RE | Admit: 2024-04-06 | Discharge: 2024-04-06 | Disposition: A | Discharge status: Home / Self Care | Source: Ambulatory Visit | Attending: Radiation Oncology | Admitting: Radiation Oncology

## 2024-04-06 DIAGNOSIS — C50312 Malignant neoplasm of lower-inner quadrant of left female breast: Secondary | ICD-10-CM | POA: Diagnosis not present

## 2024-04-06 LAB — RAD ONC ARIA SESSION SUMMARY

## 2024-04-07 ENCOUNTER — Other Ambulatory Visit: Payer: Self-pay

## 2024-04-07 ENCOUNTER — Ambulatory Visit
Admission: RE | Admit: 2024-04-07 | Discharge: 2024-04-07 | Disposition: A | Discharge status: Home / Self Care | Source: Ambulatory Visit | Attending: Radiation Oncology | Admitting: Radiation Oncology

## 2024-04-07 DIAGNOSIS — C50312 Malignant neoplasm of lower-inner quadrant of left female breast: Secondary | ICD-10-CM | POA: Diagnosis not present

## 2024-04-07 LAB — RAD ONC ARIA SESSION SUMMARY

## 2024-04-08 ENCOUNTER — Other Ambulatory Visit: Payer: Self-pay

## 2024-04-08 ENCOUNTER — Ambulatory Visit
Admission: RE | Admit: 2024-04-08 | Discharge: 2024-04-08 | Disposition: A | Discharge status: Home / Self Care | Source: Ambulatory Visit | Attending: Radiation Oncology | Admitting: Radiation Oncology

## 2024-04-08 DIAGNOSIS — C50312 Malignant neoplasm of lower-inner quadrant of left female breast: Secondary | ICD-10-CM | POA: Diagnosis not present

## 2024-04-08 LAB — RAD ONC ARIA SESSION SUMMARY
Course Elapsed Days: 16
Plan Fractions Treated to Date: 13
Plan Fractions Treated to Date: 13
Plan Prescribed Dose Per Fraction: 1.8 Gy
Plan Prescribed Dose Per Fraction: 1.8 Gy
Plan Total Fractions Prescribed: 28
Plan Total Fractions Prescribed: 28
Plan Total Prescribed Dose: 50.4 Gy
Plan Total Prescribed Dose: 50.4 Gy
Reference Point Dosage Given to Date: 23.4 Gy
Reference Point Dosage Given to Date: 23.4 Gy
Reference Point Session Dosage Given: 1.8 Gy
Reference Point Session Dosage Given: 1.8 Gy
Session Number: 13

## 2024-04-09 ENCOUNTER — Ambulatory Visit
Admission: RE | Admit: 2024-04-09 | Discharge: 2024-04-09 | Disposition: A | Discharge status: Home / Self Care | Source: Ambulatory Visit | Attending: Radiation Oncology

## 2024-04-09 ENCOUNTER — Other Ambulatory Visit: Payer: Self-pay

## 2024-04-09 DIAGNOSIS — C50312 Malignant neoplasm of lower-inner quadrant of left female breast: Secondary | ICD-10-CM | POA: Diagnosis not present

## 2024-04-09 LAB — RAD ONC ARIA SESSION SUMMARY
Course Elapsed Days: 17
Plan Fractions Treated to Date: 14
Plan Fractions Treated to Date: 14
Plan Prescribed Dose Per Fraction: 1.8 Gy
Plan Prescribed Dose Per Fraction: 1.8 Gy
Plan Total Fractions Prescribed: 28
Plan Total Fractions Prescribed: 28
Plan Total Prescribed Dose: 50.4 Gy
Plan Total Prescribed Dose: 50.4 Gy
Reference Point Dosage Given to Date: 25.2 Gy
Reference Point Dosage Given to Date: 25.2 Gy
Reference Point Session Dosage Given: 1.8 Gy
Reference Point Session Dosage Given: 1.8 Gy
Session Number: 14

## 2024-04-10 ENCOUNTER — Ambulatory Visit
Admission: RE | Admit: 2024-04-10 | Discharge: 2024-04-10 | Disposition: A | Discharge status: Home / Self Care | Source: Ambulatory Visit | Attending: Radiation Oncology | Admitting: Radiation Oncology

## 2024-04-10 ENCOUNTER — Other Ambulatory Visit: Payer: Self-pay

## 2024-04-10 DIAGNOSIS — C50312 Malignant neoplasm of lower-inner quadrant of left female breast: Secondary | ICD-10-CM | POA: Diagnosis not present

## 2024-04-10 LAB — RAD ONC ARIA SESSION SUMMARY
Course Elapsed Days: 18
Plan Fractions Treated to Date: 15
Plan Fractions Treated to Date: 15
Plan Prescribed Dose Per Fraction: 1.8 Gy
Plan Prescribed Dose Per Fraction: 1.8 Gy
Plan Total Fractions Prescribed: 28
Plan Total Fractions Prescribed: 28
Plan Total Prescribed Dose: 50.4 Gy
Plan Total Prescribed Dose: 50.4 Gy
Reference Point Dosage Given to Date: 27 Gy
Reference Point Dosage Given to Date: 27 Gy
Reference Point Session Dosage Given: 1.8 Gy
Reference Point Session Dosage Given: 1.8 Gy
Session Number: 15

## 2024-04-13 ENCOUNTER — Ambulatory Visit
Admission: RE | Admit: 2024-04-13 | Discharge: 2024-04-13 | Disposition: A | Discharge status: Home / Self Care | Source: Ambulatory Visit | Attending: Radiation Oncology

## 2024-04-13 ENCOUNTER — Other Ambulatory Visit: Payer: Self-pay

## 2024-04-13 DIAGNOSIS — C50312 Malignant neoplasm of lower-inner quadrant of left female breast: Secondary | ICD-10-CM | POA: Diagnosis not present

## 2024-04-13 LAB — RAD ONC ARIA SESSION SUMMARY
Course Elapsed Days: 21
Plan Fractions Treated to Date: 16
Plan Fractions Treated to Date: 16
Plan Prescribed Dose Per Fraction: 1.8 Gy
Plan Prescribed Dose Per Fraction: 1.8 Gy
Plan Total Fractions Prescribed: 28
Plan Total Fractions Prescribed: 28
Plan Total Prescribed Dose: 50.4 Gy
Plan Total Prescribed Dose: 50.4 Gy
Reference Point Dosage Given to Date: 28.8 Gy
Reference Point Dosage Given to Date: 28.8 Gy
Reference Point Session Dosage Given: 1.8 Gy
Reference Point Session Dosage Given: 1.8 Gy
Session Number: 16

## 2024-04-14 ENCOUNTER — Ambulatory Visit
Admission: RE | Admit: 2024-04-14 | Discharge: 2024-04-14 | Disposition: A | Discharge status: Home / Self Care | Source: Ambulatory Visit | Attending: Radiation Oncology | Admitting: Radiation Oncology

## 2024-04-14 ENCOUNTER — Other Ambulatory Visit: Payer: Self-pay

## 2024-04-14 DIAGNOSIS — C50312 Malignant neoplasm of lower-inner quadrant of left female breast: Secondary | ICD-10-CM | POA: Diagnosis not present

## 2024-04-14 LAB — RAD ONC ARIA SESSION SUMMARY
Course Elapsed Days: 22
Plan Fractions Treated to Date: 17
Plan Fractions Treated to Date: 17
Plan Prescribed Dose Per Fraction: 1.8 Gy
Plan Prescribed Dose Per Fraction: 1.8 Gy
Plan Total Fractions Prescribed: 28
Plan Total Fractions Prescribed: 28
Plan Total Prescribed Dose: 50.4 Gy
Plan Total Prescribed Dose: 50.4 Gy
Reference Point Dosage Given to Date: 30.6 Gy
Reference Point Dosage Given to Date: 30.6 Gy
Reference Point Session Dosage Given: 1.8 Gy
Reference Point Session Dosage Given: 1.8 Gy
Session Number: 17

## 2024-04-15 ENCOUNTER — Ambulatory Visit
Admission: RE | Admit: 2024-04-15 | Discharge: 2024-04-15 | Disposition: A | Discharge status: Home / Self Care | Source: Ambulatory Visit | Attending: Radiation Oncology

## 2024-04-15 ENCOUNTER — Other Ambulatory Visit: Payer: Self-pay

## 2024-04-15 DIAGNOSIS — C50312 Malignant neoplasm of lower-inner quadrant of left female breast: Secondary | ICD-10-CM | POA: Diagnosis not present

## 2024-04-15 LAB — RAD ONC ARIA SESSION SUMMARY
Course Elapsed Days: 23
Plan Fractions Treated to Date: 18
Plan Fractions Treated to Date: 18
Plan Prescribed Dose Per Fraction: 1.8 Gy
Plan Prescribed Dose Per Fraction: 1.8 Gy
Plan Total Fractions Prescribed: 28
Plan Total Fractions Prescribed: 28
Plan Total Prescribed Dose: 50.4 Gy
Plan Total Prescribed Dose: 50.4 Gy
Reference Point Dosage Given to Date: 32.4 Gy
Reference Point Dosage Given to Date: 32.4 Gy
Reference Point Session Dosage Given: 1.8 Gy
Reference Point Session Dosage Given: 1.8 Gy
Session Number: 18

## 2024-04-16 ENCOUNTER — Ambulatory Visit
Admission: RE | Admit: 2024-04-16 | Discharge: 2024-04-16 | Disposition: A | Discharge status: Home / Self Care | Source: Ambulatory Visit | Attending: Radiation Oncology | Admitting: Radiation Oncology

## 2024-04-16 ENCOUNTER — Other Ambulatory Visit: Payer: Self-pay

## 2024-04-16 DIAGNOSIS — C50312 Malignant neoplasm of lower-inner quadrant of left female breast: Secondary | ICD-10-CM | POA: Diagnosis not present

## 2024-04-16 LAB — RAD ONC ARIA SESSION SUMMARY
Course Elapsed Days: 24
Plan Fractions Treated to Date: 19
Plan Fractions Treated to Date: 19
Plan Prescribed Dose Per Fraction: 1.8 Gy
Plan Prescribed Dose Per Fraction: 1.8 Gy
Plan Total Fractions Prescribed: 28
Plan Total Fractions Prescribed: 28
Plan Total Prescribed Dose: 50.4 Gy
Plan Total Prescribed Dose: 50.4 Gy
Reference Point Dosage Given to Date: 34.2 Gy
Reference Point Dosage Given to Date: 34.2 Gy
Reference Point Session Dosage Given: 1.8 Gy
Reference Point Session Dosage Given: 1.8 Gy
Session Number: 19

## 2024-04-17 ENCOUNTER — Ambulatory Visit

## 2024-04-17 ENCOUNTER — Ambulatory Visit
Admission: RE | Admit: 2024-04-17 | Discharge: 2024-04-17 | Disposition: A | Discharge status: Home / Self Care | Source: Ambulatory Visit | Attending: Radiation Oncology | Admitting: Radiation Oncology

## 2024-04-17 ENCOUNTER — Other Ambulatory Visit: Payer: Self-pay

## 2024-04-17 DIAGNOSIS — C50312 Malignant neoplasm of lower-inner quadrant of left female breast: Secondary | ICD-10-CM | POA: Diagnosis not present

## 2024-04-17 LAB — RAD ONC ARIA SESSION SUMMARY
Course Elapsed Days: 25
Plan Fractions Treated to Date: 20
Plan Fractions Treated to Date: 20
Plan Prescribed Dose Per Fraction: 1.8 Gy
Plan Prescribed Dose Per Fraction: 1.8 Gy
Plan Total Fractions Prescribed: 28
Plan Total Fractions Prescribed: 28
Plan Total Prescribed Dose: 50.4 Gy
Plan Total Prescribed Dose: 50.4 Gy
Reference Point Dosage Given to Date: 36 Gy
Reference Point Dosage Given to Date: 36 Gy
Reference Point Session Dosage Given: 1.8 Gy
Reference Point Session Dosage Given: 1.8 Gy
Session Number: 20

## 2024-04-21 ENCOUNTER — Telehealth: Payer: Self-pay | Admitting: Adult Health

## 2024-04-21 ENCOUNTER — Other Ambulatory Visit: Payer: Self-pay

## 2024-04-21 ENCOUNTER — Ambulatory Visit
Admission: RE | Admit: 2024-04-21 | Discharge: 2024-04-21 | Disposition: A | Discharge status: Home / Self Care | Source: Ambulatory Visit | Attending: Radiation Oncology

## 2024-04-21 DIAGNOSIS — R232 Flushing: Secondary | ICD-10-CM | POA: Diagnosis not present

## 2024-04-21 DIAGNOSIS — Z170 Estrogen receptor positive status [ER+]: Secondary | ICD-10-CM | POA: Insufficient documentation

## 2024-04-21 DIAGNOSIS — C50312 Malignant neoplasm of lower-inner quadrant of left female breast: Secondary | ICD-10-CM | POA: Diagnosis present

## 2024-04-21 DIAGNOSIS — Z9221 Personal history of antineoplastic chemotherapy: Secondary | ICD-10-CM | POA: Diagnosis not present

## 2024-04-21 DIAGNOSIS — R61 Generalized hyperhidrosis: Secondary | ICD-10-CM | POA: Diagnosis not present

## 2024-04-21 DIAGNOSIS — Z923 Personal history of irradiation: Secondary | ICD-10-CM | POA: Diagnosis not present

## 2024-04-21 DIAGNOSIS — M2560 Stiffness of unspecified joint, not elsewhere classified: Secondary | ICD-10-CM | POA: Diagnosis not present

## 2024-04-21 LAB — RAD ONC ARIA SESSION SUMMARY
Course Elapsed Days: 29
Plan Fractions Treated to Date: 21
Plan Fractions Treated to Date: 21
Plan Prescribed Dose Per Fraction: 1.8 Gy
Plan Prescribed Dose Per Fraction: 1.8 Gy
Plan Total Fractions Prescribed: 28
Plan Total Fractions Prescribed: 28
Plan Total Prescribed Dose: 50.4 Gy
Plan Total Prescribed Dose: 50.4 Gy
Reference Point Dosage Given to Date: 37.8 Gy
Reference Point Dosage Given to Date: 37.8 Gy
Reference Point Session Dosage Given: 1.8 Gy
Reference Point Session Dosage Given: 1.8 Gy
Session Number: 21

## 2024-04-21 NOTE — Telephone Encounter (Signed)
 Scheduled the patient for Survivorship appointment, Called and spoke with the patient and she is aware of her appointment.

## 2024-04-22 ENCOUNTER — Other Ambulatory Visit: Payer: Self-pay

## 2024-04-22 ENCOUNTER — Ambulatory Visit
Admission: RE | Admit: 2024-04-22 | Discharge: 2024-04-22 | Disposition: A | Discharge status: Home / Self Care | Source: Ambulatory Visit | Attending: Radiation Oncology

## 2024-04-22 DIAGNOSIS — C50312 Malignant neoplasm of lower-inner quadrant of left female breast: Secondary | ICD-10-CM | POA: Diagnosis not present

## 2024-04-22 LAB — RAD ONC ARIA SESSION SUMMARY
Course Elapsed Days: 30
Plan Fractions Treated to Date: 22
Plan Fractions Treated to Date: 22
Plan Prescribed Dose Per Fraction: 1.8 Gy
Plan Prescribed Dose Per Fraction: 1.8 Gy
Plan Total Fractions Prescribed: 28
Plan Total Fractions Prescribed: 28
Plan Total Prescribed Dose: 50.4 Gy
Plan Total Prescribed Dose: 50.4 Gy
Reference Point Dosage Given to Date: 39.6 Gy
Reference Point Dosage Given to Date: 39.6 Gy
Reference Point Session Dosage Given: 1.8 Gy
Reference Point Session Dosage Given: 1.8 Gy
Session Number: 22

## 2024-04-23 ENCOUNTER — Other Ambulatory Visit: Payer: Self-pay

## 2024-04-23 ENCOUNTER — Ambulatory Visit
Admission: RE | Admit: 2024-04-23 | Discharge: 2024-04-23 | Disposition: A | Discharge status: Home / Self Care | Source: Ambulatory Visit | Attending: Radiation Oncology

## 2024-04-23 DIAGNOSIS — C50312 Malignant neoplasm of lower-inner quadrant of left female breast: Secondary | ICD-10-CM | POA: Diagnosis not present

## 2024-04-23 LAB — RAD ONC ARIA SESSION SUMMARY
Course Elapsed Days: 31
Plan Fractions Treated to Date: 23
Plan Fractions Treated to Date: 23
Plan Prescribed Dose Per Fraction: 1.8 Gy
Plan Prescribed Dose Per Fraction: 1.8 Gy
Plan Total Fractions Prescribed: 28
Plan Total Fractions Prescribed: 28
Plan Total Prescribed Dose: 50.4 Gy
Plan Total Prescribed Dose: 50.4 Gy
Reference Point Dosage Given to Date: 41.4 Gy
Reference Point Dosage Given to Date: 41.4 Gy
Reference Point Session Dosage Given: 1.8 Gy
Reference Point Session Dosage Given: 1.8 Gy
Session Number: 23

## 2024-04-24 ENCOUNTER — Ambulatory Visit
Admission: RE | Admit: 2024-04-24 | Discharge: 2024-04-24 | Disposition: A | Discharge status: Home / Self Care | Source: Ambulatory Visit | Attending: Radiation Oncology | Admitting: Radiation Oncology

## 2024-04-24 ENCOUNTER — Ambulatory Visit: Admitting: Radiation Oncology

## 2024-04-24 ENCOUNTER — Other Ambulatory Visit: Payer: Self-pay

## 2024-04-24 ENCOUNTER — Ambulatory Visit

## 2024-04-24 DIAGNOSIS — C50312 Malignant neoplasm of lower-inner quadrant of left female breast: Secondary | ICD-10-CM | POA: Diagnosis not present

## 2024-04-24 LAB — RAD ONC ARIA SESSION SUMMARY
Course Elapsed Days: 32
Plan Fractions Treated to Date: 24
Plan Fractions Treated to Date: 24
Plan Prescribed Dose Per Fraction: 1.8 Gy
Plan Prescribed Dose Per Fraction: 1.8 Gy
Plan Total Fractions Prescribed: 28
Plan Total Fractions Prescribed: 28
Plan Total Prescribed Dose: 50.4 Gy
Plan Total Prescribed Dose: 50.4 Gy
Reference Point Dosage Given to Date: 43.2 Gy
Reference Point Dosage Given to Date: 43.2 Gy
Reference Point Session Dosage Given: 1.8 Gy
Reference Point Session Dosage Given: 1.8 Gy
Session Number: 24

## 2024-04-27 ENCOUNTER — Ambulatory Visit
Admission: RE | Admit: 2024-04-27 | Discharge: 2024-04-27 | Disposition: A | Discharge status: Home / Self Care | Source: Ambulatory Visit | Attending: Radiation Oncology

## 2024-04-27 ENCOUNTER — Other Ambulatory Visit: Payer: Self-pay

## 2024-04-27 DIAGNOSIS — C50312 Malignant neoplasm of lower-inner quadrant of left female breast: Secondary | ICD-10-CM | POA: Diagnosis not present

## 2024-04-27 LAB — RAD ONC ARIA SESSION SUMMARY
Course Elapsed Days: 35
Plan Fractions Treated to Date: 25
Plan Fractions Treated to Date: 25
Plan Prescribed Dose Per Fraction: 1.8 Gy
Plan Prescribed Dose Per Fraction: 1.8 Gy
Plan Total Fractions Prescribed: 28
Plan Total Fractions Prescribed: 28
Plan Total Prescribed Dose: 50.4 Gy
Plan Total Prescribed Dose: 50.4 Gy
Reference Point Dosage Given to Date: 45 Gy
Reference Point Dosage Given to Date: 45 Gy
Reference Point Session Dosage Given: 1.8 Gy
Reference Point Session Dosage Given: 1.8 Gy
Session Number: 25

## 2024-04-28 ENCOUNTER — Other Ambulatory Visit: Payer: Self-pay

## 2024-04-28 ENCOUNTER — Ambulatory Visit
Admission: RE | Admit: 2024-04-28 | Discharge: 2024-04-28 | Disposition: A | Discharge status: Home / Self Care | Source: Ambulatory Visit | Attending: Radiation Oncology | Admitting: Radiation Oncology

## 2024-04-28 DIAGNOSIS — C50312 Malignant neoplasm of lower-inner quadrant of left female breast: Secondary | ICD-10-CM | POA: Diagnosis not present

## 2024-04-28 LAB — RAD ONC ARIA SESSION SUMMARY
Course Elapsed Days: 36
Plan Fractions Treated to Date: 26
Plan Fractions Treated to Date: 26
Plan Prescribed Dose Per Fraction: 1.8 Gy
Plan Prescribed Dose Per Fraction: 1.8 Gy
Plan Total Fractions Prescribed: 28
Plan Total Fractions Prescribed: 28
Plan Total Prescribed Dose: 50.4 Gy
Plan Total Prescribed Dose: 50.4 Gy
Reference Point Dosage Given to Date: 46.8 Gy
Reference Point Dosage Given to Date: 46.8 Gy
Reference Point Session Dosage Given: 1.8 Gy
Reference Point Session Dosage Given: 1.8 Gy
Session Number: 26

## 2024-04-29 ENCOUNTER — Ambulatory Visit
Admission: RE | Admit: 2024-04-29 | Discharge: 2024-04-29 | Disposition: A | Discharge status: Home / Self Care | Source: Ambulatory Visit | Attending: Radiation Oncology | Admitting: Radiation Oncology

## 2024-04-29 ENCOUNTER — Other Ambulatory Visit: Payer: Self-pay

## 2024-04-29 DIAGNOSIS — C50312 Malignant neoplasm of lower-inner quadrant of left female breast: Secondary | ICD-10-CM | POA: Diagnosis not present

## 2024-04-29 LAB — RAD ONC ARIA SESSION SUMMARY
Course Elapsed Days: 37
Plan Fractions Treated to Date: 27
Plan Fractions Treated to Date: 27
Plan Prescribed Dose Per Fraction: 1.8 Gy
Plan Prescribed Dose Per Fraction: 1.8 Gy
Plan Total Fractions Prescribed: 28
Plan Total Fractions Prescribed: 28
Plan Total Prescribed Dose: 50.4 Gy
Plan Total Prescribed Dose: 50.4 Gy
Reference Point Dosage Given to Date: 48.6 Gy
Reference Point Dosage Given to Date: 48.6 Gy
Reference Point Session Dosage Given: 1.8 Gy
Reference Point Session Dosage Given: 1.8 Gy
Session Number: 27

## 2024-04-30 ENCOUNTER — Other Ambulatory Visit: Payer: Self-pay

## 2024-04-30 ENCOUNTER — Ambulatory Visit
Admission: RE | Admit: 2024-04-30 | Discharge: 2024-04-30 | Disposition: A | Discharge status: Home / Self Care | Source: Ambulatory Visit | Attending: Radiation Oncology | Admitting: Radiation Oncology

## 2024-04-30 DIAGNOSIS — C50312 Malignant neoplasm of lower-inner quadrant of left female breast: Secondary | ICD-10-CM | POA: Diagnosis not present

## 2024-04-30 LAB — RAD ONC ARIA SESSION SUMMARY
Course Elapsed Days: 38
Plan Fractions Treated to Date: 28
Plan Fractions Treated to Date: 28
Plan Prescribed Dose Per Fraction: 1.8 Gy
Plan Prescribed Dose Per Fraction: 1.8 Gy
Plan Total Fractions Prescribed: 28
Plan Total Fractions Prescribed: 28
Plan Total Prescribed Dose: 50.4 Gy
Plan Total Prescribed Dose: 50.4 Gy
Reference Point Dosage Given to Date: 50.4 Gy
Reference Point Dosage Given to Date: 50.4 Gy
Reference Point Session Dosage Given: 1.8 Gy
Reference Point Session Dosage Given: 1.8 Gy
Session Number: 28

## 2024-05-01 ENCOUNTER — Other Ambulatory Visit: Payer: Self-pay

## 2024-05-01 ENCOUNTER — Ambulatory Visit
Admission: RE | Admit: 2024-05-01 | Discharge: 2024-05-01 | Disposition: A | Discharge status: Home / Self Care | Source: Ambulatory Visit | Attending: Radiation Oncology | Admitting: Radiation Oncology

## 2024-05-01 DIAGNOSIS — C50312 Malignant neoplasm of lower-inner quadrant of left female breast: Secondary | ICD-10-CM | POA: Diagnosis not present

## 2024-05-01 LAB — RAD ONC ARIA SESSION SUMMARY
Course Elapsed Days: 39
Plan Fractions Treated to Date: 1
Plan Prescribed Dose Per Fraction: 2 Gy
Plan Total Fractions Prescribed: 5
Plan Total Prescribed Dose: 10 Gy
Reference Point Dosage Given to Date: 2 Gy
Reference Point Session Dosage Given: 2 Gy
Session Number: 29

## 2024-05-04 ENCOUNTER — Other Ambulatory Visit: Payer: Self-pay

## 2024-05-04 ENCOUNTER — Ambulatory Visit
Admission: RE | Admit: 2024-05-04 | Discharge: 2024-05-04 | Disposition: A | Discharge status: Home / Self Care | Source: Ambulatory Visit | Attending: Radiation Oncology

## 2024-05-04 DIAGNOSIS — C50312 Malignant neoplasm of lower-inner quadrant of left female breast: Secondary | ICD-10-CM | POA: Diagnosis not present

## 2024-05-04 LAB — RAD ONC ARIA SESSION SUMMARY
Course Elapsed Days: 42
Plan Fractions Treated to Date: 2
Plan Prescribed Dose Per Fraction: 2 Gy
Plan Total Fractions Prescribed: 5
Plan Total Prescribed Dose: 10 Gy
Reference Point Dosage Given to Date: 4 Gy
Reference Point Session Dosage Given: 2 Gy
Session Number: 30

## 2024-05-05 ENCOUNTER — Inpatient Hospital Stay: Attending: Hematology and Oncology | Admitting: Hematology and Oncology

## 2024-05-05 ENCOUNTER — Other Ambulatory Visit: Payer: Self-pay

## 2024-05-05 ENCOUNTER — Ambulatory Visit
Admission: RE | Admit: 2024-05-05 | Discharge: 2024-05-05 | Disposition: A | Discharge status: Home / Self Care | Source: Ambulatory Visit | Attending: Radiation Oncology | Admitting: Radiation Oncology

## 2024-05-05 ENCOUNTER — Inpatient Hospital Stay

## 2024-05-05 VITALS — BP 128/83 | HR 120 | Temp 98.1°F | Resp 17 | Wt 165.8 lb

## 2024-05-05 DIAGNOSIS — R232 Flushing: Secondary | ICD-10-CM | POA: Insufficient documentation

## 2024-05-05 DIAGNOSIS — Z9221 Personal history of antineoplastic chemotherapy: Secondary | ICD-10-CM | POA: Insufficient documentation

## 2024-05-05 DIAGNOSIS — C50312 Malignant neoplasm of lower-inner quadrant of left female breast: Secondary | ICD-10-CM | POA: Diagnosis not present

## 2024-05-05 DIAGNOSIS — Z170 Estrogen receptor positive status [ER+]: Secondary | ICD-10-CM | POA: Insufficient documentation

## 2024-05-05 DIAGNOSIS — Z923 Personal history of irradiation: Secondary | ICD-10-CM | POA: Insufficient documentation

## 2024-05-05 DIAGNOSIS — R61 Generalized hyperhidrosis: Secondary | ICD-10-CM | POA: Insufficient documentation

## 2024-05-05 DIAGNOSIS — M2560 Stiffness of unspecified joint, not elsewhere classified: Secondary | ICD-10-CM | POA: Insufficient documentation

## 2024-05-05 LAB — RAD ONC ARIA SESSION SUMMARY
Course Elapsed Days: 43
Plan Fractions Treated to Date: 3
Plan Prescribed Dose Per Fraction: 2 Gy
Plan Total Fractions Prescribed: 5
Plan Total Prescribed Dose: 10 Gy
Reference Point Dosage Given to Date: 6 Gy
Reference Point Session Dosage Given: 2 Gy
Session Number: 31

## 2024-05-05 LAB — PREGNANCY, URINE: Preg Test, Ur: NEGATIVE

## 2024-05-05 NOTE — Progress Notes (Signed)
 Patient Care Team: Fabio Norberta Duncans, PA-C as PCP - General (Family Medicine) Tyree Nanetta SAILOR, RN as Oncology Nurse Navigator Ebbie Cough, MD as Consulting Physician (General Surgery) Odean Potts, MD as Consulting Physician (Hematology and Oncology) Dewey Rush, MD as Consulting Physician (Radiation Oncology)  DIAGNOSIS:  Encounter Diagnosis  Name Primary?   Malignant neoplasm of lower-inner quadrant of left breast in female, estrogen receptor positive (HCC) Yes    SUMMARY OF ONCOLOGIC HISTORY: Oncology History  Malignant neoplasm of lower-inner quadrant of left breast in female, estrogen receptor positive (HCC)  08/20/2023 Initial Diagnosis   Screening mammogram detected left breast distortion at 7 o'clock position measuring 1.2 cm.  Extra lymph node biopsy benign, biopsy of the breast: Grade 2 IDC ER 90%, PR 90%, HER2 negative by FISH, Ki-67 5%   08/26/2023 Cancer Staging   Staging form: Breast, AJCC 8th Edition - Clinical stage from 08/26/2023: Stage IA (cT1c, cN0(f), cM0, G2, ER+, PR+, HER2-) - Signed by Odean Potts, MD on 08/28/2023 Stage prefix: Initial diagnosis Method of lymph node assessment: Core biopsy Histologic grading system: 3 grade system   09/05/2023 Genetic Testing   Negative [facility] CancerNext-Expanded +RNAinsight Panel.  Report date is 09/05/2023.   The CancerNext-Expanded gene panel offered by [facility] and includes sequencing, rearrangement, and RNA analysis for the following 76 genes: AIP, ALK, APC, ATM, AXIN2, BAP1, BARD1, BMPR1A, BRCA1, BRCA2, BRIP1, CDC73, CDH1, CDK4, CDKN1B, CDKN2A, CEBPA, CHEK2, CTNNA1, DDX41, DICER1, ETV6, FH, FLCN, GATA2, LZTR1, MAX, MBD4, MEN1, MET, MLH1, MSH2, MSH3, MSH6, MUTYH, NF1, NF2, NTHL1, PALB2, PHOX2B, PMS2, POT1, PRKAR1A, PTCH1, PTEN, RAD51C, RAD51D, RB1, RET, RUNX1, SDHA, SDHAF2, SDHB, SDHC, SDHD, SMAD4, SMARCA4, SMARCB1, SMARCE1, STK11, SUFU, TMEM127, TP53, TSC1, TSC2, VHL, and WT1 (sequencing and  deletion/duplication); EGFR, HOXB13, KIT, MITF, PDGFRA, POLD1, and POLE (sequencing only); EPCAM and GREM1 (deletion/duplication only).    09/17/2023 Surgery    Left lumpectomy: Grade 1 IDC 2 cm with intermediate grade DCIS, margins negative, LVI identified, ER 90%, PR 90%, HER2 negative by FISH, Ki-67 5%, 1/5 lymph nodes positive    09/17/2023 Cancer Staging   Staging form: Breast, AJCC 8th Edition - Pathologic stage from 09/17/2023: Stage IA (pT1c, pN1a, cM0, G1, ER+, PR+, HER2-) - Signed by Crawford Morna Pickle, NP on 12/31/2023 Stage prefix: Initial diagnosis Histologic grading system: 3 grade system   10/15/2023 -  Chemotherapy   Patient is on Treatment Plan : BREAST DOSE DENSE AC q14d / PACLitaxel  q7d       CHIEF COMPLIANT: Follow-up towards end of radiation  HISTORY OF PRESENT ILLNESS:   History of Present Illness Alicia Roach is a 48 year old female with breast cancer who presents for follow-up regarding her treatment and menopausal status.  She is undergoing radiation therapy, resulting in significant skin irritation described as raw and burnt. Burn cream is being used for management. Chemotherapy has been completed, and she is now on anti-estrogen therapy. Her menstrual cycles have not resumed post-chemotherapy. Prior to chemotherapy, she had regular cycles but stopped birth control pills after her breast cancer diagnosis, experiencing a couple of cycles before they ceased.  She experiences hot flashes, characterized by periods of feeling extremely hot and sweating profusely. Joint stiffness occurs, especially after prolonged sitting.  Blood test results are pending to determine her menopausal status, which will inform further treatment decisions.     ALLERGIES:  is allergic to methimazole.  MEDICATIONS:  Current Outpatient Medications  Medication Sig Dispense Refill   Collagen Hydrolysate POWD  2 Scoops by Does not apply route.     estradiol  (ESTRACE  VAGINAL)  0.1 MG/GM vaginal cream Place 1 Applicatorful vaginally at bedtime. 42.5 g 12   levothyroxine  (SYNTHROID ) 125 MCG tablet Take 125 mcg by mouth daily before breakfast.     lidocaine -prilocaine  (EMLA ) cream Apply to affected area once 30 g 0   medium chain triglycerides (MCT OIL) oil Take by mouth 3 (three) times daily.     venlafaxine XR (EFFEXOR-XR) 150 MG 24 hr capsule Take 150 mg by mouth daily.     venlafaxine XR (EFFEXOR-XR) 75 MG 24 hr capsule Take 75 mg by mouth daily.     VRAYLAR 1.5 MG capsule Take 1.5 mg by mouth every other day.     No current facility-administered medications for this visit.    PHYSICAL EXAMINATION: ECOG PERFORMANCE STATUS: 1 - Symptomatic but completely ambulatory  Vitals:   05/05/24 1521  BP: 128/83  Pulse: (!) 120  Resp: 17  Temp: 98.1 F (36.7 C)  SpO2: 99%   Filed Weights   05/05/24 1521  Weight: 165 lb 12.8 oz (75.2 kg)      LABORATORY DATA:  I have reviewed the data as listed    Latest Ref Rng & Units 02/25/2024    9:59 AM 02/18/2024   10:22 AM 02/11/2024   11:13 AM  CMP  Glucose 70 - 99 mg/dL 78  92  93   BUN 6 - 20 mg/dL 13  11  11    Creatinine 0.44 - 1.00 mg/dL 9.27  9.32  9.33   Sodium 135 - 145 mmol/L 141  140  138   Potassium 3.5 - 5.1 mmol/L 4.3  4.1  4.4   Chloride 98 - 111 mmol/L 109  109  107   CO2 22 - 32 mmol/L 28  26  27    Calcium 8.9 - 10.3 mg/dL 9.8  9.8  9.8   Total Protein 6.5 - 8.1 g/dL 6.5  6.9  7.0   Total Bilirubin 0.0 - 1.2 mg/dL 0.3  0.4  0.3   Alkaline Phos 38 - 126 U/L 76  84  84   AST 15 - 41 U/L 18  20  32   ALT 0 - 44 U/L 20  23  41     Lab Results  Component Value Date   WBC 5.4 02/25/2024   HGB 12.3 02/25/2024   HCT 36.9 02/25/2024   MCV 94.9 02/25/2024   PLT 296 02/25/2024   NEUTROABS 3.4 02/25/2024    ASSESSMENT & PLAN:  Malignant neoplasm of lower-inner quadrant of left breast in female, estrogen receptor positive (HCC) 08/20/2023:Screening mammogram detected left breast distortion at 7  o'clock position measuring 1.2 cm.  Extra lymph node biopsy benign, biopsy of the breast: Grade 2 IDC ER 90%, PR 90%, HER2 negative by FISH, Ki-67 5%  09/17/2023: Left lumpectomy: Grade 1 IDC 2 cm with intermediate grade DCIS, margins negative, LVI identified, ER 90%, PR 90%, HER2 negative by FISH, Ki-67 5%, 1/5 lymph nodes positive   Treatment plan: Adjuvant chemotherapy with dose dense Adriamycin  and Cytoxan  x 4 followed by Taxol  weekly x 12 completed 02/25/2024 Adjuvant radiation therapy 04/06/2024-05/07/2024 Adjuvant antiestrogen therapy with ovarian function suppression and anastrozole --------------------------------------------------------------------------------------------------------------------- Current treatment: Discussed the pros and cons of ovarian function suppression on anastrozole therapy. She is not eligible for Verzinio because of grade being 1 and Ki-67 only being 5% We will obtain guardant reveal for MRD testing I recommended that we obtain blood work  for menopause status to determine anastrozole versus tamoxifen.  Assessment & Plan Estrogen receptor positive left breast cancer She is completing radiation therapy. Anti-estrogen therapy is next, dependent on menopausal status. - Order blood tests for estradiol  and FSH to determine menopausal status. - Prescribe anastrozole if in menopause. - Prescribe tamoxifen if premenopausal. - Schedule follow-up call in two weeks to discuss blood test results and finalize treatment plan.  Surveillance for breast cancer recurrence Guardant blood test proposed for early recurrence detection, covered by most insurances. - Consider Guardant blood test every six months for early detection of recurrence.  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 - Order blood tests for estradiol  and FSH to assess menopausal status. - Repeat blood tests in three months if initial results  indicate menopause to confirm status.  Hot flashes and joint stiffness associated with chemically induced menopause Symptoms expected to persist with anti-estrogen therapy.  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 - Continue using burn cream as prescribed.       Orders Placed This Encounter  Procedures   Estradiol , Sensitive    Standing Status:   Future    Number of Occurrences:   1    Expiration Date:   05/05/2025   FSH-Follicle stimulating hormone    Standing Status:   Future    Number of Occurrences:   1    Expiration Date:   05/05/2025   The patient has a good understanding of the overall plan. she agrees with it. she will call with any problems that may develop before the next visit here. Total time spent: 30 mins including face to face time and time spent for planning, charting and co-ordination of care   Naomi MARLA Chad, MD 05/05/24

## 2024-05-05 NOTE — Assessment & Plan Note (Signed)
 08/20/2023:Screening mammogram detected left breast distortion at 7 o'clock position measuring 1.2 cm.  Extra lymph node biopsy benign, biopsy of the breast: Grade 2 IDC ER 90%, PR 90%, HER2 negative by FISH, Ki-67 5%  09/17/2023: Left lumpectomy: Grade 1 IDC 2 cm with intermediate grade DCIS, margins negative, LVI identified, ER 90%, PR 90%, HER2 negative by FISH, Ki-67 5%, 1/5 lymph nodes positive   Treatment plan: Adjuvant chemotherapy with dose dense Adriamycin  and Cytoxan  x 4 followed by Taxol  weekly x 12 completed 02/25/2024 Adjuvant radiation therapy 04/06/2024-05/07/2024 Adjuvant antiestrogen therapy with ovarian function suppression and anastrozole --------------------------------------------------------------------------------------------------------------------- Current treatment: Discussed the pros and cons of ovarian function suppression on anastrozole therapy. She is not eligible for Verzinio because of grade being 1 and Ki-67 only being 5%

## 2024-05-06 ENCOUNTER — Other Ambulatory Visit: Payer: Self-pay

## 2024-05-06 ENCOUNTER — Ambulatory Visit
Admission: RE | Admit: 2024-05-06 | Discharge: 2024-05-06 | Disposition: A | Discharge status: Home / Self Care | Source: Ambulatory Visit | Attending: Radiation Oncology | Admitting: Radiation Oncology

## 2024-05-06 DIAGNOSIS — C50312 Malignant neoplasm of lower-inner quadrant of left female breast: Secondary | ICD-10-CM | POA: Diagnosis not present

## 2024-05-06 LAB — RAD ONC ARIA SESSION SUMMARY
Course Elapsed Days: 44
Plan Fractions Treated to Date: 4
Plan Prescribed Dose Per Fraction: 2 Gy
Plan Total Fractions Prescribed: 5
Plan Total Prescribed Dose: 10 Gy
Reference Point Dosage Given to Date: 8 Gy
Reference Point Session Dosage Given: 2 Gy
Session Number: 32

## 2024-05-06 LAB — FOLLICLE STIMULATING HORMONE: FSH: 99.8 m[IU]/mL

## 2024-05-07 ENCOUNTER — Ambulatory Visit
Admission: RE | Admit: 2024-05-07 | Discharge: 2024-05-07 | Disposition: A | Discharge status: Home / Self Care | Source: Ambulatory Visit | Attending: Radiation Oncology | Admitting: Radiation Oncology

## 2024-05-07 ENCOUNTER — Other Ambulatory Visit: Payer: Self-pay

## 2024-05-07 ENCOUNTER — Ambulatory Visit

## 2024-05-07 DIAGNOSIS — C50312 Malignant neoplasm of lower-inner quadrant of left female breast: Secondary | ICD-10-CM | POA: Diagnosis not present

## 2024-05-07 LAB — RAD ONC ARIA SESSION SUMMARY
Course Elapsed Days: 45
Plan Fractions Treated to Date: 5
Plan Prescribed Dose Per Fraction: 2 Gy
Plan Total Fractions Prescribed: 5
Plan Total Prescribed Dose: 10 Gy
Reference Point Dosage Given to Date: 10 Gy
Reference Point Session Dosage Given: 2 Gy
Session Number: 33

## 2024-05-08 ENCOUNTER — Telehealth: Payer: Self-pay

## 2024-05-08 LAB — ESTRADIOL, ULTRA SENS: Estradiol, Sensitive: <2.5 pg/mL

## 2024-05-08 NOTE — Telephone Encounter (Signed)
 Per md orders entered for Guardant Reveal and all supported documents faxed to [phone]. Faxed confirmation was received.

## 2024-05-08 NOTE — Radiation Completion Notes (Addendum)
  Radiation Oncology         (336) [phone] ________________________________  Name: Alicia Roach MRN: [id number]  Date of Service: 05/07/2024  DOB: September 28, 1975  End of Treatment Note   Diagnosis: Stage IA, pT1cN1aM0, grade 1, ER/PR positive invasive ductal carcinoma of the left breast.   Intent: Curative     ==========DELIVERED PLANS==========  First Treatment Date: 2024-03-23 Last Treatment Date: 2024-05-07   Plan Name: Breast_L_BH Site: Breast, Left Technique: 3D Mode: Photon Dose Per Fraction: 1.8 Gy Prescribed Dose (Delivered / Prescribed): 50.4 Gy / 50.4 Gy Prescribed Fxs (Delivered / Prescribed): 28 / 28   Plan Name: Brst_L_Scv_BH Site: Sclav-LT Technique: 3D Mode: Photon Dose Per Fraction: 1.8 Gy Prescribed Dose (Delivered / Prescribed): 50.4 Gy / 50.4 Gy Prescribed Fxs (Delivered / Prescribed): 28 / 28   Plan Name: Brst_L_Bst_BH Site: Breast, Left Technique: 3D Mode: Photon Dose Per Fraction: 2 Gy Prescribed Dose (Delivered / Prescribed): 10 Gy / 10 Gy Prescribed Fxs (Delivered / Prescribed): 5 / 5     ==========ON TREATMENT VISIT DATES========== 2024-03-27, 2024-04-03, 2024-04-10, 2024-04-17, 2024-04-24, 2024-05-01, 2024-05-07    See weekly On Treatment Notes in Epic for details in the Media tab (listed as Progress notes on the On Treatment Visit Dates listed above).  The patient tolerated radiation. She developed fatigue and anticipated skin changes in the treatment field. She did have dry desquamation along the supraclavicular region but no moist desquamation at the conclusion of her treatment.   The patient will receive a call in about one month from the radiation oncology department. She will continue follow up with Dr. Gudena as well.      Donald KYM Husband, PAC

## 2024-05-11 ENCOUNTER — Encounter: Payer: Self-pay | Admitting: Hematology and Oncology

## 2024-05-19 ENCOUNTER — Inpatient Hospital Stay (HOSPITAL_BASED_OUTPATIENT_CLINIC_OR_DEPARTMENT_OTHER): Admitting: Hematology and Oncology

## 2024-05-19 DIAGNOSIS — Z170 Estrogen receptor positive status [ER+]: Secondary | ICD-10-CM | POA: Diagnosis not present

## 2024-05-19 DIAGNOSIS — C50312 Malignant neoplasm of lower-inner quadrant of left female breast: Secondary | ICD-10-CM

## 2024-05-19 MED ORDER — ANASTROZOLE 1 MG PO TABS: 1.0000 mg | ORAL_TABLET | Freq: Every day | ORAL | 3 refills | Status: AC

## 2024-05-19 NOTE — Progress Notes (Signed)
 HEMATOLOGY-ONCOLOGY TELEPHONE VISIT PROGRESS NOTE  I connected with our patient on 05/19/24 at 11:45 AM EDT by telephone and verified that I am speaking with the correct person using two identifiers.  I discussed the limitations, risks, security and privacy concerns of performing an evaluation and management service by telephone and the availability of in person appointments.  I also discussed with the patient that there may be a patient responsible charge related to this service. The patient expressed understanding and agreed to proceed.   History of Present Illness:   History of Present Illness Alicia Roach is a 48 year old female who presents for evaluation of menopause.  Her blood tests show an FSH level of 99.8, confirming menopause. There is consideration to recheck the blood test in three months to rule out any effects from chemotherapy. She is not currently on menopause-specific medication but will begin anastrozole, to be taken once daily at bedtime. A 90-day supply will be provided.    Oncology History  Malignant neoplasm of lower-inner quadrant of left breast in female, estrogen receptor positive (HCC)  08/20/2023 Initial Diagnosis   Screening mammogram detected left breast distortion at 7 o'clock position measuring 1.2 cm.  Extra lymph node biopsy benign, biopsy of the breast: Grade 2 IDC ER 90%, PR 90%, HER2 negative by FISH, Ki-67 5%   08/26/2023 Cancer Staging   Staging form: Breast, AJCC 8th Edition - Clinical stage from 08/26/2023: Stage IA (cT1c, cN0(f), cM0, G2, ER+, PR+, HER2-) - Signed by Odean Potts, MD on 08/28/2023 Stage prefix: Initial diagnosis Method of lymph node assessment: Core biopsy Histologic grading system: 3 grade system   09/05/2023 Genetic Testing   Negative [facility] CancerNext-Expanded +RNAinsight Panel.  Report date is 09/05/2023.   The CancerNext-Expanded gene panel offered by [facility] and includes sequencing, rearrangement, and RNA analysis  for the following 76 genes: AIP, ALK, APC, ATM, AXIN2, BAP1, BARD1, BMPR1A, BRCA1, BRCA2, BRIP1, CDC73, CDH1, CDK4, CDKN1B, CDKN2A, CEBPA, CHEK2, CTNNA1, DDX41, DICER1, ETV6, FH, FLCN, GATA2, LZTR1, MAX, MBD4, MEN1, MET, MLH1, MSH2, MSH3, MSH6, MUTYH, NF1, NF2, NTHL1, PALB2, PHOX2B, PMS2, POT1, PRKAR1A, PTCH1, PTEN, RAD51C, RAD51D, RB1, RET, RUNX1, SDHA, SDHAF2, SDHB, SDHC, SDHD, SMAD4, SMARCA4, SMARCB1, SMARCE1, STK11, SUFU, TMEM127, TP53, TSC1, TSC2, VHL, and WT1 (sequencing and deletion/duplication); EGFR, HOXB13, KIT, MITF, PDGFRA, POLD1, and POLE (sequencing only); EPCAM and GREM1 (deletion/duplication only).    09/17/2023 Surgery    Left lumpectomy: Grade 1 IDC 2 cm with intermediate grade DCIS, margins negative, LVI identified, ER 90%, PR 90%, HER2 negative by FISH, Ki-67 5%, 1/5 lymph nodes positive    09/17/2023 Cancer Staging   Staging form: Breast, AJCC 8th Edition - Pathologic stage from 09/17/2023: Stage IA (pT1c, pN1a, cM0, G1, ER+, PR+, HER2-) - Signed by Crawford Morna Pickle, NP on 12/31/2023 Stage prefix: Initial diagnosis Histologic grading system: 3 grade system   10/15/2023 -  Chemotherapy   Patient is on Treatment Plan : BREAST DOSE DENSE AC q14d / PACLitaxel  q7d       REVIEW OF SYSTEMS:   Constitutional: Denies fevers, chills or abnormal weight loss All other systems were reviewed with the patient and are negative. Observations/Objective:     Assessment Plan:  Malignant neoplasm of lower-inner quadrant of left breast in female, estrogen receptor positive (HCC) 08/20/2023:Screening mammogram detected left breast distortion at 7 o'clock position measuring 1.2 cm.  Extra lymph node biopsy benign, biopsy of the breast: Grade 2 IDC ER 90%, PR 90%, HER2 negative by FISH, Ki-67  5%  09/17/2023: Left lumpectomy: Grade 1 IDC 2 cm with intermediate grade DCIS, margins negative, LVI identified, ER 90%, PR 90%, HER2 negative by FISH, Ki-67 5%, 1/5 lymph nodes positive   Treatment  plan: Adjuvant chemotherapy with dose dense Adriamycin  and Cytoxan  x 4 followed by Taxol  weekly x 12 completed 02/25/2024 Adjuvant radiation therapy 04/06/2024-05/07/2024 Adjuvant antiestrogen therapy with anastrozole --------------------------------------------------------------------------------------------------------------------- Current treatment:  06/08/2024: FSH 99.8, estradiol  less than 2.5: Patient is menopausal Recommendation: Anastrozole  We will obtain guardant reveal for MRD testing  Return to clinic in 3 months for survivorship care plan visit  Assessment & Plan 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 - Prescribe anastrozole once daily at bedtime. - Send 90-day supply of anastrozole to [facility], [address]. - Recheck blood test in three months to confirm menopause status. - Maintain appointment with Manuelita in December to assess response to anastrozole.      I discussed the assessment and treatment plan with the patient. The patient was provided an opportunity to ask questions and all were answered. The patient agreed with the plan and demonstrated an understanding of the instructions. The patient was advised to call back or seek an in-person evaluation if the symptoms worsen or if the condition fails to improve as anticipated.   I provided 20 minutes of non-face-to-face time during this encounter.  This includes time for charting and coordination of care   Naomi MARLA Chad, MD

## 2024-05-19 NOTE — Assessment & Plan Note (Addendum)
 08/20/2023:Screening mammogram detected left breast distortion at 7 o'clock position measuring 1.2 cm.  Extra lymph node biopsy benign, biopsy of the breast: Grade 2 IDC ER 90%, PR 90%, HER2 negative by FISH, Ki-67 5%  09/17/2023: Left lumpectomy: Grade 1 IDC 2 cm with intermediate grade DCIS, margins negative, LVI identified, ER 90%, PR 90%, HER2 negative by FISH, Ki-67 5%, 1/5 lymph nodes positive   Treatment plan: Adjuvant chemotherapy with dose dense Adriamycin  and Cytoxan  x 4 followed by Taxol  weekly x 12 completed 02/25/2024 Adjuvant radiation therapy 04/06/2024-05/07/2024 Adjuvant antiestrogen therapy with anastrozole --------------------------------------------------------------------------------------------------------------------- Current treatment:  06/08/2024: FSH 99.8, estradiol  less than 2.5: Patient is menopausal Recommendation: Anastrozole  We will obtain guardant reveal for MRD testing  Return to clinic in 3 months for survivorship care plan visit

## 2024-05-28 ENCOUNTER — Telehealth: Payer: Self-pay | Admitting: *Deleted

## 2024-05-28 NOTE — Telephone Encounter (Signed)
 Per MD request RN placed call to pt with recent Guardant Reveal results being negative.  No answer, LVM for pt to return call to the office.

## 2024-06-02 ENCOUNTER — Encounter: Payer: Self-pay | Admitting: Hematology and Oncology

## 2024-06-02 NOTE — Progress Notes (Signed)
  Radiation Oncology         (336) [phone] ________________________________  Name: Alicia Roach MRN: [id number]  Date of Service: 06/08/2024  DOB: 08/22/1975  Post Treatment Telephone Note  Diagnosis: Stage IA, pT1cN1aM0, grade 1, ER/PR positive invasive ductal carcinoma of the left breast.   First Treatment Date: 2024-03-23 Last Treatment Date: 2024-05-07   Plan Name: Breast_L_BH Site: Breast, Left Technique: 3D Mode: Photon Dose Per Fraction: 1.8 Gy Prescribed Dose (Delivered / Prescribed): 50.4 Gy / 50.4 Gy Prescribed Fxs (Delivered / Prescribed): 28 / 28   Plan Name: Brst_L_Scv_BH Site: Sclav-LT Technique: 3D Mode: Photon Dose Per Fraction: 1.8 Gy Prescribed Dose (Delivered / Prescribed): 50.4 Gy / 50.4 Gy Prescribed Fxs (Delivered / Prescribed): 28 / 28   Plan Name: Brst_L_Bst_BH Site: Breast, Left Technique: 3D Mode: Photon Dose Per Fraction: 2 Gy Prescribed Dose (Delivered / Prescribed): 10 Gy / 10 Gy Prescribed Fxs (Delivered / Prescribed): 5 / 5     The patient was available for call today.   Symptoms of fatigue have improved since completing therapy.  Symptoms of skin changes have improved since completing therapy.  The patient was encouraged to avoid sun exposure in the area of prior treatment for up to one year following radiation with either sunscreen or by the style of clothing worn in the sun.  The patient has scheduled follow up with her medical oncologist Dr. Gudena for ongoing surveillance, and was encouraged to call if she develops concerns or questions regarding radiation.

## 2024-06-08 ENCOUNTER — Ambulatory Visit
Admission: RE | Admit: 2024-06-08 | Discharge: 2024-06-08 | Disposition: A | Discharge status: Home / Self Care | Source: Ambulatory Visit | Attending: Hematology and Oncology | Admitting: Hematology and Oncology

## 2024-06-08 DIAGNOSIS — C50312 Malignant neoplasm of lower-inner quadrant of left female breast: Secondary | ICD-10-CM | POA: Insufficient documentation

## 2024-06-08 DIAGNOSIS — Z170 Estrogen receptor positive status [ER+]: Secondary | ICD-10-CM | POA: Insufficient documentation

## 2024-06-15 ENCOUNTER — Ambulatory Visit: Attending: General Surgery

## 2024-06-15 VITALS — Wt 165.1 lb

## 2024-06-15 DIAGNOSIS — Z483 Aftercare following surgery for neoplasm: Secondary | ICD-10-CM | POA: Insufficient documentation

## 2024-06-15 NOTE — Therapy (Signed)
 OUTPATIENT PHYSICAL THERAPY SOZO SCREENING NOTE   Patient Name: Alicia Roach MRN: [id number] DOB:October 28, 1975, 48 y.o., female Today's Date: 06/15/2024  PCP: Fabio Norberta Duncans, PA-C REFERRING PROVIDER: Ebbie Cough, MD   PT End of Session - 06/15/24 [phone]     Visit Number 2   # unchanged due to screen only   PT Start Time 0850    PT Stop Time 0854    PT Time Calculation (min) 4 min    Activity Tolerance Patient tolerated treatment well    Behavior During Therapy [facility] for tasks assessed/performed          Past Medical History:  Diagnosis Date   Cancer (HCC)    Depression    GAD (generalized anxiety disorder)    Dr. 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   Past Surgical History:  Procedure Laterality Date   BREAST BIOPSY Left 08/20/2023   US  LT BREAST BX W LOC DEV 1ST LESION IMG BX SPEC US  GUIDE 08/20/2023 [facility]MO[facility]   BREAST BIOPSY Left 09/16/2023   US  LT RADIOACTIVE SEED LOC 09/16/2023 [facility] MAMMOGRAPHY   BREAST BIOPSY Left 09/16/2023   US  LT RADIOACTIVE SEED EA ADD LESION 09/16/2023 [facility] MAMMOGR[facility]   BREAST LUMPECTOMY WITH RADIOACTIVE SEED AND SENTINEL LYMPH NODE BIOPSY Left 09/17/2023   Procedure: LEFT BREAST SEED GUIDED LUMPECTOMY, LEFT AXILLARY SENTINEL NODE BIOPSY;  Surgeon: Ebbie Cough, MD;  Location: [facility] OR;  Service: General;  Laterality: Left;  GEN PEC BLOCK   CESAREAN SECTION  10/11/2011   Procedure: CESAREAN SECTION;  Surgeon: Charlie CHRISTELLA Croak, MD;  Location: [facility] ORS;  Service: Gynecology;  Laterality: N/A;   digital reconstructive surgery (middle finger of L hand)     PORTACATH PLACEMENT Right 10/07/2023   Procedure: INSERTION PORT-A-CATH WITH ULTRASOUND GUIDANCE;  Surgeon: Ebbie Cough, MD;  Location: [facility];  Service: General;  Laterality: Right;    RADIOACTIVE SEED GUIDED AXILLARY SENTINEL LYMPH NODE Left 09/17/2023   Procedure: LEFT AXILLARY NODE SEED GUIDED EXCISION;  Surgeon: Ebbie Cough, MD;  Location: [facility] OR;  Service: General;  Laterality: Left;   TONSILLECTOMY  1999   WISDOM TOOTH EXTRACTION     Patient Active Problem List   Diagnosis Date Noted   Port-A-Cath in place 10/15/2023   Genetic testing 09/06/2023   Malignant neoplasm of lower-inner quadrant of left breast in female, estrogen receptor positive (HCC) 08/26/2023   Acute low back pain 02/28/2014   Pharyngitis, acute 07/02/2012   Viral URI 04/23/2012   SINUSITIS - ACUTE-NOS 06/16/2010   ADVERSE DRUG REACTION 10/18/2009   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 She underwent a left lumpectomy and sentinel node biopsy on 09/17/2023 (1 of 5 nodes positive for carcinoma). It is ER/PR positive and HER2 negative with a Ki67 of 5%   PRECAUTIONS: left UE Lymphedema risk  SUBJECTIVE: Pt returns for her 3  month L-Dex screen.   PAIN:  Are you having pain? No  SOZO SCREENING: Patient was assessed today using the SOZO machine to determine the lymphedema index score. This was compared to her baseline score. It was determined that  she is within the recommended range when compared to her baseline and no further action is needed at this time. She will continue SOZO screenings. These are done every 3 months for 2 years post operatively followed by every 6 months for 2 years, and then annually.    L-DEX FLOWSHEETS - 06/15/24 0800       L-DEX LYMPHEDEMA SCREENING   Measurement Type Unilateral    L-DEX MEASUREMENT EXTREMITY Upper Extremity    POSITION  Standing    DOMINANT SIDE Right    At Risk Side Left    BASELINE  SCORE (UNILATERAL) 0.8    L-DEX SCORE (UNILATERAL) 1.3    VALUE CHANGE (UNILAT) 0.5           Aden Berwyn Caldron, PTA 06/15/2024, 8:52 AM

## 2024-06-16 ENCOUNTER — Other Ambulatory Visit: Payer: Self-pay

## 2024-07-29 ENCOUNTER — Encounter: Payer: Self-pay | Admitting: Hematology and Oncology

## 2024-08-04 ENCOUNTER — Encounter: Payer: Self-pay | Admitting: Adult Health

## 2024-08-04 ENCOUNTER — Inpatient Hospital Stay: Attending: Adult Health | Admitting: Adult Health

## 2024-08-04 VITALS — BP 119/79 | HR 127 | Temp 97.3°F | Ht 66.0 in | Wt 165.3 lb

## 2024-08-04 DIAGNOSIS — Z170 Estrogen receptor positive status [ER+]: Secondary | ICD-10-CM | POA: Insufficient documentation

## 2024-08-04 DIAGNOSIS — C50312 Malignant neoplasm of lower-inner quadrant of left female breast: Secondary | ICD-10-CM | POA: Diagnosis present

## 2024-08-04 DIAGNOSIS — Z1732 Human epidermal growth factor receptor 2 negative status: Secondary | ICD-10-CM | POA: Diagnosis not present

## 2024-08-04 DIAGNOSIS — Z9221 Personal history of antineoplastic chemotherapy: Secondary | ICD-10-CM | POA: Insufficient documentation

## 2024-08-04 DIAGNOSIS — Z87891 Personal history of nicotine dependence: Secondary | ICD-10-CM | POA: Diagnosis not present

## 2024-08-04 DIAGNOSIS — Z79811 Long term (current) use of aromatase inhibitors: Secondary | ICD-10-CM | POA: Diagnosis not present

## 2024-08-04 DIAGNOSIS — Z923 Personal history of irradiation: Secondary | ICD-10-CM | POA: Diagnosis not present

## 2024-08-04 DIAGNOSIS — Z79899 Other long term (current) drug therapy: Secondary | ICD-10-CM | POA: Diagnosis not present

## 2024-08-04 DIAGNOSIS — Z1721 Progesterone receptor positive status: Secondary | ICD-10-CM | POA: Diagnosis not present

## 2024-08-06 NOTE — Progress Notes (Signed)
 SURVIVORSHIP VISIT:  BRIEF ONCOLOGIC HISTORY:  Oncology History  Malignant neoplasm of lower-inner quadrant of left breast in female, estrogen receptor positive (HCC)  08/20/2023 Initial Diagnosis   Screening mammogram detected left breast distortion at 7 o'clock position measuring 1.2 cm.  Extra lymph node biopsy benign, biopsy of the breast: Grade 2 IDC ER 90%, PR 90%, HER2 negative by FISH, Ki-67 5%   08/26/2023 Cancer Staging   Staging form: Breast, AJCC 8th Edition - Clinical stage from 08/26/2023: Stage IA (cT1c, cN0(f), cM0, G2, ER+, PR+, HER2-) - Signed by Odean Potts, MD on 08/28/2023 Stage prefix: Initial diagnosis Method of lymph node assessment: Core biopsy Histologic grading system: 3 grade system   09/05/2023 Genetic Testing   Negative [facility] CancerNext-Expanded +RNAinsight Panel.  Report date is 09/05/2023.   The CancerNext-Expanded gene panel offered by [facility] and includes sequencing, rearrangement, and RNA analysis for the following 76 genes: AIP, ALK, APC, ATM, AXIN2, BAP1, BARD1, BMPR1A, BRCA1, BRCA2, BRIP1, CDC73, CDH1, CDK4, CDKN1B, CDKN2A, CEBPA, CHEK2, CTNNA1, DDX41, DICER1, ETV6, FH, FLCN, GATA2, LZTR1, MAX, MBD4, MEN1, MET, MLH1, MSH2, MSH3, MSH6, MUTYH, NF1, NF2, NTHL1, PALB2, PHOX2B, PMS2, POT1, PRKAR1A, PTCH1, PTEN, RAD51C, RAD51D, RB1, RET, RUNX1, SDHA, SDHAF2, SDHB, SDHC, SDHD, SMAD4, SMARCA4, SMARCB1, SMARCE1, STK11, SUFU, TMEM127, TP53, TSC1, TSC2, VHL, and WT1 (sequencing and deletion/duplication); EGFR, HOXB13, KIT, MITF, PDGFRA, POLD1, and POLE (sequencing only); EPCAM and GREM1 (deletion/duplication only).    09/17/2023 Surgery    Left lumpectomy: Grade 1 IDC 2 cm with intermediate grade DCIS, margins negative, LVI identified, ER 90%, PR 90%, HER2 negative by FISH, Ki-67 5%, 1/5 lymph nodes positive    09/17/2023 Cancer Staging   Staging form: Breast, AJCC 8th Edition - Pathologic stage from 09/17/2023: Stage IA (pT1c, pN1a, cM0, G1, ER+, PR+, HER2-) -  Signed by Crawford Morna Pickle, NP on 12/31/2023 Stage prefix: Initial diagnosis Histologic grading system: 3 grade system   10/15/2023 - 02/25/2024 Chemotherapy   Patient is on Treatment Plan : BREAST DOSE DENSE AC q14d / PACLitaxel  q7d     03/23/2024 - 05/07/2024 Radiation Therapy   Plan Name: Breast_L_BH Site: Breast, Left Technique: 3D Mode: Photon Dose Per Fraction: 1.8 Gy Prescribed Dose (Delivered / Prescribed): 50.4 Gy / 50.4 Gy Prescribed Fxs (Delivered / Prescribed): 28 / 28   Plan Name: Brst_L_Scv_BH Site: Sclav-LT Technique: 3D Mode: Photon Dose Per Fraction: 1.8 Gy Prescribed Dose (Delivered / Prescribed): 50.4 Gy / 50.4 Gy Prescribed Fxs (Delivered / Prescribed): 28 / 28   Plan Name: Brst_L_Bst_BH Site: Breast, Left Technique: 3D Mode: Photon Dose Per Fraction: 2 Gy Prescribed Dose (Delivered / Prescribed): 10 Gy / 10 Gy Prescribed Fxs (Delivered / Prescribed): 5 / 5   05/2024 -  Anti-estrogen oral therapy   Anastrozole      INTERVAL HISTORY:  Discussed the use of AI scribe software for clinical note transcription with the patient, who gave verbal consent to proceed.  History of Present Illness Alicia Roach is a 48 year old female with stage 1A ER/PR positive left breast cancer, status post lumpectomy, adjuvant chemotherapy, and radiation, presenting for oncology follow-up and survivorship care.  She completed lumpectomy with removal of five lymph nodes (one positive), adjuvant chemotherapy on July 8, and radiation for stage 1A ER/PR positive left breast cancer. Genetic testing was negative. She is on anastrozole  and has been amenorrheic since finishing chemotherapy, without vaginal bleeding.   She has persistent left breast numbness with intermittent tightness and swelling after radiation, and ongoing fatigue. She  completed physical therapy exercises for post-radiation tightness but is not doing them regularly now. She has joint and bone pain that  worsens with activity and fluctuating stiffness. She uses ibuprofen  as needed with food and water and stays active to reduce stiffness. She denies hot flashes or vaginal dryness on anastrozole .  She has hyperthyroidism treated with radioactive iodine and is followed by endocrinology. She feels some fatigue may be thyroid  related and notes occasional memory lapses, such as forgetting if she has taken medications. She recently had a normal echocardiogram. She is working and plans time off during the holidays, and she expresses frustration that her prior healthy lifestyle did not prevent cancer.    REVIEW OF SYSTEMS:  Review of Systems  Constitutional:  Negative for appetite change, chills, fatigue, fever and unexpected weight change.  HENT:   Negative for hearing loss, lump/mass and trouble swallowing.   Eyes:  Negative for eye problems and icterus.  Respiratory:  Negative for chest tightness, cough and shortness of breath.   Cardiovascular:  Negative for chest pain, leg swelling and palpitations.  Gastrointestinal:  Negative for abdominal distention, abdominal pain, constipation, diarrhea, nausea and vomiting.  Endocrine: Negative for hot flashes.  Genitourinary:  Negative for difficulty urinating.   Musculoskeletal:  Negative for arthralgias.  Skin:  Negative for itching and rash.  Neurological:  Negative for dizziness, extremity weakness, headaches and numbness.  Hematological:  Negative for adenopathy. Does not bruise/bleed easily.  Psychiatric/Behavioral:  Negative for depression. The patient is not nervous/anxious.    Breast: Denies any new nodularity, masses, tenderness, nipple changes, or nipple discharge.       PAST MEDICAL/SURGICAL HISTORY:  Past Medical History:  Diagnosis Date   Cancer [facility])    Depression    GAD (generalized anxiety disorder)    Dr. Elodie McKinney--sees her regularly   Hypothyroidism    s/p radioactive iodine ablation 2010    PONV (postoperative nausea  and vomiting)    PONV during c section   Retropharyngeal abscess 08/21/1995   Past Surgical History:  Procedure Laterality Date   BREAST BIOPSY Left 08/20/2023   US  LT BREAST BX W LOC DEV 1ST LESION IMG BX SPEC US  GUIDE 08/20/2023 [facility] MAMMOGRAPHY   BREAST BIOPSY Left 09/16/2023   US  LT RADIOACTIVE SEED LOC 09/16/2023 [facility]AMMO[facility]   BREAST BIOPSY Left 09/16/2023   US  LT RADIOACTIVE SEED EA ADD LESION 09/16/2023 [facility] MAMMOGRAP[facility]   BREAST LUMPECTOMY WITH RADIOACTIVE SEED AND SENTINEL LYMPH NODE BIOPSY Left 09/17/2023   Procedure: LEFT BREAST SEED GUIDED LUMPECTOMY, LEFT AXILLARY SENTINEL NODE BIOPSY;  Surgeon: Ebbie Cough, MD;  Location[facility] OR;  Service: General;  Laterality: Left;  GEN PEC BLOCK   CESAREAN SECTION  10/11/2011   Procedure: CESAREAN SECTION;  Surgeon: Charlie CHRISTELLA Croak, MD;  Location: [facility] ORS;  Service: Gynecology;  Laterality: N/A;   digital reconstructive surgery (middle finger of L hand)     PORTACATH PLACEMENT Right 10/07/2023   Procedure: INSERTION PORT-A-CATH WITH ULTRASOUND GUIDANCE;  Surgeon: Ebbie Cough, MD;  Location: [facility];  Service: General;  Laterality: Right;   RADIOACTIVE SEED GUIDED AXILLARY SENTINEL LYMPH NODE Left 09/17/2023   Procedure: LEFT AXILLARY NODE SEED GUIDED EXCISION;  Surgeon: Ebbie Cough, MD;  Location: [facility] OR;  Service: General;  Laterality: Left;   TONSILLECTOMY  1999   WISDOM TOOTH EXTRACTION       ALLERGIES:  Allergies[1]   CURRENT MEDICATIONS:  Outpatient Encounter Medications as of 08/04/2024  Medication Sig   anastrozole  (  ARIMIDEX ) 1 MG tablet Take 1 tablet (1 mg total) by mouth daily.   Collagen Hydrolysate POWD 2 Scoops by Does not apply route.   estradiol  (ESTRACE  VAGINAL) 0.1 MG/GM vaginal cream Place 1 Applicatorful vaginally at bedtime.   levothyroxine  (SYNTHROID ) 125 MCG tablet Take 125 mcg by mouth daily before breakfast.   medium chain triglycerides (MCT OIL) oil Take by mouth 3  (three) times daily.   venlafaxine XR (EFFEXOR-XR) 150 MG 24 hr capsule Take 150 mg by mouth daily.   venlafaxine XR (EFFEXOR-XR) 75 MG 24 hr capsule Take 75 mg by mouth daily.   VRAYLAR 1.5 MG capsule Take 1.5 mg by mouth every other day. (Patient not taking: Reported on 08/04/2024)   No facility-administered encounter medications on file as of 08/04/2024.     ONCOLOGIC FAMILY HISTORY:  Family History  Problem Relation Age of Onset   Hyperthyroidism Mother    Depression Mother    Depression Sister    Prostate cancer Maternal Uncle        x2 mat uncles   Cancer Maternal Uncle        unknown; mets dx >50   Skin cancer Paternal Aunt    Prostate cancer Paternal Uncle    Prostate cancer Cousin        mat cousin; mets; reported BRCA1 pos   Colon cancer Cousin        mat female cousin; mets; reported BRCA1 pos   Ovarian cancer Cousin        mat cousin; reported BRCA1 pos   Stomach cancer Cousin        mat cousin   Cancer Cousin        mat cousin; lung and kidney cancers     SOCIAL HISTORY:  Social History   Socioeconomic History   Marital status: Divorced    Spouse name: Not on file   Number of children: Not on file   Years of education: Not on file   Highest education level: Not on file  Occupational History   Not on file  Tobacco Use   Smoking status: Former    Current packs/day: 0.00    Types: Cigarettes    Start date: 08/20/1993    Quit date: 08/20/2001    Years since quitting: 22.9   Smokeless tobacco: Never  Vaping Use   Vaping status: Never Used  Substance and Sexual Activity   Alcohol use: Yes    Comment: glass of wine on occassion   Drug use: No   Sexual activity: Yes    Birth control/protection: None  Other Topics Concern   Not on file  Social History Narrative   Married, 1 son 1/2 yr old.   Banker for [employer].   HS at [employer] in [place] but lived in [place], [place] prior.   Smoked some in HS/College.  Occasional alcohol.         Social Drivers of Health    Tobacco Use: Medium Risk (08/04/2024)   Patient History    Smoking Tobacco Use: Former    Smokeless Tobacco Use: Never    Passive Exposure: Not on file  Financial Resource Strain: Low Risk (08/04/2024)   Overall Financial Resource Strain (CARDIA)    Difficulty of Paying Living Expenses: Not hard at all  Food Insecurity: No Food Insecurity (08/04/2024)   Epic    Worried About Programme Researcher, Broadcasting/film/video in the Last Year: Never true    Ran Out of Food in the Last Year: Never  true  Transportation Needs: No Transportation Needs (08/04/2024)   Epic    Lack of Transportation (Medical): No    Lack of Transportation (Non-Medical): No  Physical Activity: Inactive (08/04/2024)   Exercise Vital Sign    Days of Exercise per Week: 0 days    Minutes of Exercise per Session: 0 min  Stress: No Stress Concern Present (08/04/2024)   [employer] of Occupational Health - Occupational Stress Questionnaire    Feeling of Stress: Only a little  Social Connections: Moderately Integrated (08/04/2024)   Social Connection and Isolation Panel    Frequency of Communication with Friends and Family: Twice a week    Frequency of Social Gatherings with Friends and Family: Three times a week    Attends Religious Services: 1 to 4 times per year    Active Member of Clubs or Organizations: No    Attends Banker Meetings: Never    Marital Status: Living with partner  Intimate Partner Violence: Not At Risk (08/04/2024)   Epic    Fear of Current or Ex-Partner: No    Emotionally Abused: No    Physically Abused: No    Sexually Abused: No  Depression (PHQ2-9): Low Risk (08/04/2024)   Depression (PHQ2-9)    PHQ-2 Score: 0  Alcohol Screen: Low Risk (08/04/2024)   Alcohol Screen    Last Alcohol Screening Score (AUDIT): 0  Housing: Unknown (08/04/2024)   Epic    Unable to Pay for Housing in the Last Year: No    Number of Times Moved in the Last Year: Not on file    Homeless in the Last Year: No   Utilities: Not At Risk (08/04/2024)   Epic    Threatened with loss of utilities: No  Health Literacy: Adequate Health Literacy (08/04/2024)   B1300 Health Literacy    Frequency of need for help with medical instructions: Never     OBSERVATIONS/OBJECTIVE:  BP 119/79 (BP Location: Right Arm, Patient Position: Sitting, Cuff Size: Normal)   Pulse (!) 127   Temp (!) 97.3 F (36.3 C) (Temporal)   Ht 5' 6 (1.676 m)   Wt 165 lb 4.8 oz (75 kg)   LMP 02/18/2024 (Approximate) Comment: FSH and LH from 04/2024 consistent with post-menopause  SpO2 98%   BMI 26.68 kg/m  GENERAL: Patient is a well appearing female in no acute distress HEENT:  Sclerae anicteric.  Oropharynx clear and moist. No ulcerations or evidence of oropharyngeal candidiasis. Neck is supple.  NODES:  No cervical, supraclavicular, or axillary lymphadenopathy palpated.  BREAST EXAM:  left breast s/p lumpectomy and radiation, no sign of local recurrence, right breast benign LUNGS:  Clear to auscultation bilaterally.  No wheezes or rhonchi. HEART:  Regular rate and rhythm. No murmur appreciated. ABDOMEN:  Soft, nontender.  Positive, normoactive bowel sounds. No organomegaly palpated. MSK:  No focal spinal tenderness to palpation. Full range of motion bilaterally in the upper extremities. EXTREMITIES:  No peripheral edema.   SKIN:  Clear with no obvious rashes or skin changes. No nail dyscrasia. NEURO:  Nonfocal. Well oriented.  Appropriate affect.   LABORATORY DATA:  None for this visit.  DIAGNOSTIC IMAGING:  None for this visit.      ASSESSMENT AND PLAN:  Ms.. Alicia Roach is a pleasant 48 y.o. female with Stage IA left breast invasive ductal carcinoma, ER+/PR+/HER2-, diagnosed in 07/2023, treated with lumpectomy, adjuvant chemotherapy, adjuvant radiation therapy, and anti-estrogen therapy with Anastrozole  beginning in 05/2024.  She presents to the Survivorship Clinic for our  initial meeting and routine follow-up post-completion  of treatment for breast cancer.    1. Stage IA left breast cancer:  Ms. Lesperance is continuing to recover from definitive treatment for breast cancer. She will follow-up with her medical oncologist, Dr.  Gudena in 04/2025 with history and physical exam per surveillance protocol.  She will continue her anti-estrogen therapy with Anastrozole . Thus far, she is tolerating the Anastrozole  well, with minimal side effects. Her mammogram is due 10/2024 and will be contrast enhanced considering her age at breast cancer diagnosis (less than 50); orders placed today.  She will continue to undergo guardant testing every 6 months next due 11/2024. Today, a comprehensive survivorship care plan and treatment summary was reviewed with the patient today detailing her breast cancer diagnosis, treatment course, potential late/long-term effects of treatment, appropriate follow-up care with recommendations for the future, and patient education resources.  A copy of this summary, along with a letter will be sent to the patients primary care provider via mail/fax/In Basket message after todays visit.    2. Bone health:  Given Ms. Colburn's age/history of breast cancer and her current treatment regimen including anti-estrogen therapy with Anastrozole , she is at risk for bone demineralization.  Bone density testing was ordered at today's visit. She was given education on specific activities to promote bone health.  3. Cancer screening:  Due to Ms. Orlowski's history and her age, she should receive screening for skin cancers, colon cancer, and gynecologic cancers.  The information and recommendations are listed on the patient's comprehensive care plan/treatment summary and were reviewed in detail with the patient.    4. Health maintenance and wellness promotion: Ms. Zirbes was encouraged to consume 5-7 servings of fruits and vegetables per day. We reviewed the Nutrition Rainbow handout.  She was also encouraged to engage in moderate to  vigorous exercise for 30 minutes per day most days of the week.  She was instructed to limit her alcohol consumption and continue to abstain from tobacco use.     5. Support services/counseling: It is not uncommon for this period of the patient's cancer care trajectory to be one of many emotions and stressors.   She was given information regarding our available services and encouraged to contact me with any questions or for help enrolling in any of our support group/programs.    Follow up instructions:    -Return to cancer center in 04/2025 for f/u with Dr. Odean  - See Dr. Ebbie in 10/2024 Vickey Reveal testing in 11/2024 and 05/2025 -Mammogram due in 10/2024 -DEXA testing ordered to be completed in the next 6 months.  -She is welcome to return back to the Survivorship Clinic at any time; no additional follow-up needed at this time.  -Consider referral back to survivorship as a long-term survivor for continued surveillance  The patient was provided an opportunity to ask questions and all were answered. The patient agreed with the plan and demonstrated an understanding of the instructions.   Total encounter time:45 minutes*in face-to-face visit time, chart review, lab review, care coordination, order entry, and documentation of the encounter time.    Morna Kendall, NP 08/06/2024 12:59 PM Medical Oncology and Hematology [facility] [address] [place], [place] [address] Tel. [phone]    Fax. [phone]  *Total Encounter Time as defined by the Centers for Medicare and Medicaid Services includes, in addition to the face-to-face time of a patient visit (documented in the note above) non-face-to-face time: obtaining and reviewing outside history,  ordering and reviewing medications, tests or procedures, care coordination (communications with other health care professionals or caregivers) and documentation in the medical record.     [1]  Allergies Allergen  Reactions   Methimazole Rash

## 2024-09-14 ENCOUNTER — Ambulatory Visit

## 2024-09-30 ENCOUNTER — Other Ambulatory Visit

## 2025-04-20 ENCOUNTER — Inpatient Hospital Stay: Admitting: Hematology and Oncology
